# Patient Record
Sex: Female | Born: 1960 | Race: Black or African American | Hispanic: No | Smoking: Never smoker
Health system: Southern US, Community
[De-identification: ages and names within clinical notes are randomized; demographics above are authoritative.]

## PROBLEM LIST (undated history)

## (undated) DIAGNOSIS — M329 Systemic lupus erythematosus, unspecified: Secondary | ICD-10-CM

## (undated) DIAGNOSIS — G459 Transient cerebral ischemic attack, unspecified: Secondary | ICD-10-CM

## (undated) DIAGNOSIS — M549 Dorsalgia, unspecified: Secondary | ICD-10-CM

## (undated) DIAGNOSIS — R0789 Other chest pain: Secondary | ICD-10-CM

## (undated) DIAGNOSIS — K589 Irritable bowel syndrome without diarrhea: Secondary | ICD-10-CM

## (undated) DIAGNOSIS — R609 Edema, unspecified: Secondary | ICD-10-CM

## (undated) DIAGNOSIS — IMO0002 Reserved for concepts with insufficient information to code with codable children: Secondary | ICD-10-CM

## (undated) DIAGNOSIS — I639 Cerebral infarction, unspecified: Secondary | ICD-10-CM

## (undated) DIAGNOSIS — M797 Fibromyalgia: Secondary | ICD-10-CM

## (undated) DIAGNOSIS — Z8719 Personal history of other diseases of the digestive system: Secondary | ICD-10-CM

## (undated) DIAGNOSIS — I10 Essential (primary) hypertension: Secondary | ICD-10-CM

## (undated) DIAGNOSIS — I5030 Unspecified diastolic (congestive) heart failure: Secondary | ICD-10-CM

## (undated) DIAGNOSIS — R55 Syncope and collapse: Secondary | ICD-10-CM

## (undated) DIAGNOSIS — Z9889 Other specified postprocedural states: Secondary | ICD-10-CM

## (undated) DIAGNOSIS — R768 Other specified abnormal immunological findings in serum: Secondary | ICD-10-CM

## (undated) DIAGNOSIS — G8929 Other chronic pain: Secondary | ICD-10-CM

## (undated) DIAGNOSIS — I341 Nonrheumatic mitral (valve) prolapse: Secondary | ICD-10-CM

## (undated) DIAGNOSIS — M79609 Pain in unspecified limb: Secondary | ICD-10-CM

## (undated) DIAGNOSIS — F419 Anxiety disorder, unspecified: Secondary | ICD-10-CM

## (undated) HISTORY — PX: ABDOMINAL HYSTERECTOMY: SHX81

## (undated) HISTORY — DX: Irritable bowel syndrome, unspecified: K58.9

## (undated) HISTORY — PX: CARDIAC CATHETERIZATION: SHX172

## (undated) HISTORY — DX: Edema, unspecified: R60.9

## (undated) HISTORY — DX: Transient cerebral ischemic attack, unspecified: G45.9

## (undated) HISTORY — PX: SPINE SURGERY: SHX786

## (undated) HISTORY — DX: Dorsalgia, unspecified: M54.9

## (undated) HISTORY — DX: Other chronic pain: G89.29

## (undated) HISTORY — DX: Anxiety disorder, unspecified: F41.9

## (undated) HISTORY — DX: Syncope and collapse: R55

## (undated) HISTORY — PX: TONSILLECTOMY: SUR1361

## (undated) HISTORY — DX: Pain in unspecified limb: M79.609

## (undated) HISTORY — DX: Other specified abnormal immunological findings in serum: R76.8

---

## 1997-12-22 ENCOUNTER — Ambulatory Visit (HOSPITAL_COMMUNITY): Admission: RE | Admit: 1997-12-22 | Discharge: 1997-12-22 | Payer: Self-pay | Admitting: Obstetrics and Gynecology

## 1998-02-13 ENCOUNTER — Inpatient Hospital Stay (HOSPITAL_COMMUNITY): Admission: RE | Admit: 1998-02-13 | Discharge: 1998-02-15 | Payer: Self-pay | Admitting: Obstetrics and Gynecology

## 1999-04-06 ENCOUNTER — Emergency Department (HOSPITAL_COMMUNITY): Admission: EM | Admit: 1999-04-06 | Discharge: 1999-04-07 | Payer: Self-pay | Admitting: Emergency Medicine

## 1999-07-26 ENCOUNTER — Emergency Department (HOSPITAL_COMMUNITY): Admission: EM | Admit: 1999-07-26 | Discharge: 1999-07-26 | Payer: Self-pay | Admitting: Emergency Medicine

## 1999-07-26 ENCOUNTER — Encounter: Payer: Self-pay | Admitting: Emergency Medicine

## 1999-11-17 ENCOUNTER — Other Ambulatory Visit: Admission: RE | Admit: 1999-11-17 | Discharge: 1999-11-17 | Payer: Self-pay | Admitting: Obstetrics and Gynecology

## 1999-11-30 ENCOUNTER — Encounter: Payer: Self-pay | Admitting: Emergency Medicine

## 1999-11-30 ENCOUNTER — Emergency Department (HOSPITAL_COMMUNITY): Admission: EM | Admit: 1999-11-30 | Discharge: 1999-11-30 | Payer: Self-pay | Admitting: Emergency Medicine

## 2000-03-31 ENCOUNTER — Other Ambulatory Visit: Admission: RE | Admit: 2000-03-31 | Discharge: 2000-03-31 | Payer: Self-pay | Admitting: Obstetrics and Gynecology

## 2000-04-01 ENCOUNTER — Emergency Department (HOSPITAL_COMMUNITY): Admission: EM | Admit: 2000-04-01 | Discharge: 2000-04-01 | Payer: Self-pay | Admitting: Emergency Medicine

## 2000-04-01 ENCOUNTER — Encounter: Payer: Self-pay | Admitting: Emergency Medicine

## 2000-05-30 ENCOUNTER — Encounter: Payer: Self-pay | Admitting: Family Medicine

## 2000-05-30 ENCOUNTER — Ambulatory Visit (HOSPITAL_COMMUNITY): Admission: RE | Admit: 2000-05-30 | Discharge: 2000-05-30 | Payer: Self-pay | Admitting: Family Medicine

## 2000-06-08 ENCOUNTER — Ambulatory Visit (HOSPITAL_COMMUNITY): Admission: RE | Admit: 2000-06-08 | Discharge: 2000-06-08 | Payer: Self-pay | Admitting: Neurology

## 2000-06-08 ENCOUNTER — Encounter: Payer: Self-pay | Admitting: Neurology

## 2000-06-14 ENCOUNTER — Encounter: Payer: Self-pay | Admitting: Emergency Medicine

## 2000-06-14 ENCOUNTER — Inpatient Hospital Stay (HOSPITAL_COMMUNITY): Admission: EM | Admit: 2000-06-14 | Discharge: 2000-06-15 | Payer: Self-pay | Admitting: Emergency Medicine

## 2000-07-27 ENCOUNTER — Ambulatory Visit (HOSPITAL_COMMUNITY): Admission: RE | Admit: 2000-07-27 | Discharge: 2000-07-27 | Payer: Self-pay | Admitting: Gastroenterology

## 2000-07-29 ENCOUNTER — Encounter: Payer: Self-pay | Admitting: Family Medicine

## 2000-07-29 ENCOUNTER — Ambulatory Visit (HOSPITAL_COMMUNITY): Admission: RE | Admit: 2000-07-29 | Discharge: 2000-07-29 | Payer: Self-pay | Admitting: Family Medicine

## 2000-08-11 ENCOUNTER — Emergency Department (HOSPITAL_COMMUNITY): Admission: EM | Admit: 2000-08-11 | Discharge: 2000-08-11 | Payer: Self-pay | Admitting: Emergency Medicine

## 2000-08-11 ENCOUNTER — Encounter: Payer: Self-pay | Admitting: Emergency Medicine

## 2000-11-09 ENCOUNTER — Encounter: Payer: Self-pay | Admitting: Emergency Medicine

## 2000-11-09 ENCOUNTER — Emergency Department (HOSPITAL_COMMUNITY): Admission: EM | Admit: 2000-11-09 | Discharge: 2000-11-09 | Payer: Self-pay | Admitting: *Deleted

## 2000-11-20 ENCOUNTER — Emergency Department (HOSPITAL_COMMUNITY): Admission: EM | Admit: 2000-11-20 | Discharge: 2000-11-20 | Payer: Self-pay | Admitting: Emergency Medicine

## 2000-11-20 ENCOUNTER — Encounter: Payer: Self-pay | Admitting: Emergency Medicine

## 2000-11-22 ENCOUNTER — Encounter: Payer: Self-pay | Admitting: Emergency Medicine

## 2000-11-22 ENCOUNTER — Emergency Department (HOSPITAL_COMMUNITY): Admission: EM | Admit: 2000-11-22 | Discharge: 2000-11-22 | Payer: Self-pay | Admitting: Emergency Medicine

## 2000-12-23 ENCOUNTER — Ambulatory Visit (HOSPITAL_COMMUNITY): Admission: RE | Admit: 2000-12-23 | Discharge: 2000-12-23 | Payer: Self-pay | Admitting: Family Medicine

## 2000-12-23 ENCOUNTER — Encounter: Payer: Self-pay | Admitting: Family Medicine

## 2000-12-28 ENCOUNTER — Ambulatory Visit (HOSPITAL_BASED_OUTPATIENT_CLINIC_OR_DEPARTMENT_OTHER): Admission: RE | Admit: 2000-12-28 | Discharge: 2000-12-28 | Payer: Self-pay | Admitting: Family Medicine

## 2001-01-03 ENCOUNTER — Encounter: Admission: RE | Admit: 2001-01-03 | Discharge: 2001-01-03 | Payer: Self-pay | Admitting: Family Medicine

## 2001-01-03 ENCOUNTER — Encounter: Payer: Self-pay | Admitting: Family Medicine

## 2001-01-18 ENCOUNTER — Encounter: Payer: Self-pay | Admitting: Family Medicine

## 2001-01-18 ENCOUNTER — Encounter: Admission: RE | Admit: 2001-01-18 | Discharge: 2001-01-18 | Payer: Self-pay | Admitting: Family Medicine

## 2001-08-17 ENCOUNTER — Other Ambulatory Visit: Admission: RE | Admit: 2001-08-17 | Discharge: 2001-08-17 | Payer: Self-pay | Admitting: Gynecology

## 2001-10-03 ENCOUNTER — Inpatient Hospital Stay (HOSPITAL_COMMUNITY): Admission: AD | Admit: 2001-10-03 | Discharge: 2001-10-05 | Payer: Self-pay | Admitting: Psychiatry

## 2001-10-17 ENCOUNTER — Other Ambulatory Visit (HOSPITAL_COMMUNITY): Admission: RE | Admit: 2001-10-17 | Discharge: 2001-10-21 | Payer: Self-pay | Admitting: Psychiatry

## 2001-11-10 ENCOUNTER — Encounter: Admission: RE | Admit: 2001-11-10 | Discharge: 2001-11-10 | Payer: Self-pay | Admitting: *Deleted

## 2002-08-15 ENCOUNTER — Other Ambulatory Visit: Admission: RE | Admit: 2002-08-15 | Discharge: 2002-08-15 | Payer: Self-pay | Admitting: Obstetrics and Gynecology

## 2002-08-20 ENCOUNTER — Ambulatory Visit (HOSPITAL_BASED_OUTPATIENT_CLINIC_OR_DEPARTMENT_OTHER): Admission: RE | Admit: 2002-08-20 | Discharge: 2002-08-20 | Payer: Self-pay | Admitting: Neurology

## 2002-11-14 ENCOUNTER — Ambulatory Visit (HOSPITAL_BASED_OUTPATIENT_CLINIC_OR_DEPARTMENT_OTHER): Admission: RE | Admit: 2002-11-14 | Discharge: 2002-11-14 | Payer: Self-pay | Admitting: Neurology

## 2003-05-27 ENCOUNTER — Encounter: Payer: Self-pay | Admitting: Emergency Medicine

## 2003-05-27 ENCOUNTER — Emergency Department (HOSPITAL_COMMUNITY): Admission: EM | Admit: 2003-05-27 | Discharge: 2003-05-27 | Payer: Self-pay | Admitting: Emergency Medicine

## 2003-09-10 ENCOUNTER — Other Ambulatory Visit: Admission: RE | Admit: 2003-09-10 | Discharge: 2003-09-10 | Payer: Self-pay | Admitting: Obstetrics and Gynecology

## 2003-11-27 ENCOUNTER — Encounter: Admission: RE | Admit: 2003-11-27 | Discharge: 2003-11-27 | Payer: Self-pay | Admitting: Gastroenterology

## 2003-12-13 ENCOUNTER — Emergency Department (HOSPITAL_COMMUNITY): Admission: EM | Admit: 2003-12-13 | Discharge: 2003-12-13 | Payer: Self-pay | Admitting: Emergency Medicine

## 2003-12-21 ENCOUNTER — Ambulatory Visit (HOSPITAL_COMMUNITY): Admission: RE | Admit: 2003-12-21 | Discharge: 2003-12-21 | Payer: Self-pay | Admitting: Gastroenterology

## 2004-08-25 ENCOUNTER — Emergency Department (HOSPITAL_COMMUNITY): Admission: EM | Admit: 2004-08-25 | Discharge: 2004-08-25 | Payer: Self-pay | Admitting: Emergency Medicine

## 2004-09-15 ENCOUNTER — Ambulatory Visit (HOSPITAL_COMMUNITY): Admission: RE | Admit: 2004-09-15 | Discharge: 2004-09-15 | Payer: Self-pay | Admitting: Cardiology

## 2004-12-07 ENCOUNTER — Encounter: Admission: RE | Admit: 2004-12-07 | Discharge: 2004-12-07 | Payer: Self-pay | Admitting: Family Medicine

## 2005-02-11 ENCOUNTER — Ambulatory Visit (HOSPITAL_COMMUNITY): Admission: RE | Admit: 2005-02-11 | Discharge: 2005-02-11 | Payer: Self-pay | Admitting: Family Medicine

## 2005-02-11 ENCOUNTER — Ambulatory Visit: Payer: Self-pay | Admitting: Cardiology

## 2005-02-11 ENCOUNTER — Encounter: Payer: Self-pay | Admitting: Cardiology

## 2005-05-06 ENCOUNTER — Encounter: Admission: RE | Admit: 2005-05-06 | Discharge: 2005-05-06 | Payer: Self-pay | Admitting: Family Medicine

## 2006-06-15 ENCOUNTER — Emergency Department (HOSPITAL_COMMUNITY): Admission: EM | Admit: 2006-06-15 | Discharge: 2006-06-15 | Payer: Self-pay | Admitting: Emergency Medicine

## 2007-07-19 ENCOUNTER — Ambulatory Visit (HOSPITAL_COMMUNITY): Admission: RE | Admit: 2007-07-19 | Discharge: 2007-07-19 | Payer: Self-pay | Admitting: Family Medicine

## 2008-02-07 ENCOUNTER — Ambulatory Visit: Payer: Self-pay | Admitting: Vascular Surgery

## 2008-02-08 ENCOUNTER — Encounter: Admission: RE | Admit: 2008-02-08 | Discharge: 2008-03-13 | Payer: Self-pay | Admitting: Orthopaedic Surgery

## 2009-04-06 ENCOUNTER — Emergency Department (HOSPITAL_COMMUNITY): Admission: EM | Admit: 2009-04-06 | Discharge: 2009-04-06 | Payer: Self-pay | Admitting: Emergency Medicine

## 2009-12-12 ENCOUNTER — Encounter: Admission: RE | Admit: 2009-12-12 | Discharge: 2009-12-12 | Payer: Self-pay | Admitting: Family Medicine

## 2010-07-31 ENCOUNTER — Ambulatory Visit (HOSPITAL_COMMUNITY): Admission: RE | Admit: 2010-07-31 | Discharge: 2010-07-31 | Payer: Self-pay | Admitting: Internal Medicine

## 2010-10-09 ENCOUNTER — Emergency Department (HOSPITAL_COMMUNITY): Admission: EM | Admit: 2010-10-09 | Discharge: 2010-07-12 | Payer: Self-pay | Admitting: Emergency Medicine

## 2010-12-05 ENCOUNTER — Emergency Department (HOSPITAL_COMMUNITY): Payer: PRIVATE HEALTH INSURANCE

## 2010-12-05 ENCOUNTER — Emergency Department (HOSPITAL_COMMUNITY)
Admission: EM | Admit: 2010-12-05 | Discharge: 2010-12-05 | Disposition: A | Discharge status: Home / Self Care | Payer: PRIVATE HEALTH INSURANCE | Attending: Emergency Medicine | Admitting: Emergency Medicine

## 2010-12-05 DIAGNOSIS — M19019 Primary osteoarthritis, unspecified shoulder: Secondary | ICD-10-CM | POA: Insufficient documentation

## 2010-12-05 DIAGNOSIS — M542 Cervicalgia: Secondary | ICD-10-CM | POA: Insufficient documentation

## 2010-12-05 DIAGNOSIS — M25519 Pain in unspecified shoulder: Secondary | ICD-10-CM | POA: Insufficient documentation

## 2011-01-15 LAB — URINALYSIS, ROUTINE W REFLEX MICROSCOPIC
Bilirubin Urine: NEGATIVE
Glucose, UA: NEGATIVE mg/dL
Hgb urine dipstick: NEGATIVE
Ketones, ur: NEGATIVE mg/dL
Nitrite: NEGATIVE
Protein, ur: NEGATIVE mg/dL
Specific Gravity, Urine: 1.015 (ref 1.005–1.030)
Urobilinogen, UA: 1 mg/dL (ref 0.0–1.0)
pH: 7.5 (ref 5.0–8.0)

## 2011-01-15 LAB — DIFFERENTIAL
Basophils Absolute: 0 10*3/uL (ref 0.0–0.1)
Basophils Relative: 1 % (ref 0–1)
Eosinophils Absolute: 0.2 10*3/uL (ref 0.0–0.7)
Eosinophils Relative: 3 % (ref 0–5)
Lymphocytes Relative: 31 % (ref 12–46)
Lymphs Abs: 2 10*3/uL (ref 0.7–4.0)
Monocytes Absolute: 0.4 10*3/uL (ref 0.1–1.0)
Monocytes Relative: 7 % (ref 3–12)
Neutro Abs: 3.9 10*3/uL (ref 1.7–7.7)
Neutrophils Relative %: 58 % (ref 43–77)

## 2011-01-15 LAB — CBC
HCT: 34.9 % — ABNORMAL LOW (ref 36.0–46.0)
Hemoglobin: 12 g/dL (ref 12.0–15.0)
MCH: 31.9 pg (ref 26.0–34.0)
MCHC: 34.4 g/dL (ref 30.0–36.0)
MCV: 92.9 fL (ref 78.0–100.0)
Platelets: 181 10*3/uL (ref 150–400)
RBC: 3.76 MIL/uL — ABNORMAL LOW (ref 3.87–5.11)
RDW: 12.4 % (ref 11.5–15.5)
WBC: 6.5 10*3/uL (ref 4.0–10.5)

## 2011-01-15 LAB — COMPREHENSIVE METABOLIC PANEL
ALT: 12 U/L (ref 0–35)
AST: 22 U/L (ref 0–37)
Albumin: 3.8 g/dL (ref 3.5–5.2)
Alkaline Phosphatase: 47 U/L (ref 39–117)
BUN: 8 mg/dL (ref 6–23)
CO2: 28 mEq/L (ref 19–32)
Calcium: 8.8 mg/dL (ref 8.4–10.5)
Chloride: 107 mEq/L (ref 96–112)
Creatinine, Ser: 0.84 mg/dL (ref 0.4–1.2)
GFR calc Af Amer: >60 mL/min (ref >60)
GFR calc non Af Amer: >60 mL/min (ref >60)
Glucose, Bld: 96 mg/dL (ref 70–99)
Potassium: 3.6 mEq/L (ref 3.5–5.1)
Sodium: 139 mEq/L (ref 135–145)
Total Bilirubin: 0.3 mg/dL (ref 0.3–1.2)
Total Protein: 6.7 g/dL (ref 6.0–8.3)

## 2011-01-15 LAB — PREGNANCY, URINE: Preg Test, Ur: NEGATIVE

## 2011-01-15 LAB — LIPASE, BLOOD: Lipase: 26 U/L (ref 11–59)

## 2011-01-23 ENCOUNTER — Emergency Department (HOSPITAL_COMMUNITY)
Admission: EM | Admit: 2011-01-23 | Discharge: 2011-01-23 | Discharge status: Against Medical Advice | Payer: PRIVATE HEALTH INSURANCE | Attending: Emergency Medicine | Admitting: Emergency Medicine

## 2011-02-09 LAB — URINALYSIS, ROUTINE W REFLEX MICROSCOPIC
Bilirubin Urine: NEGATIVE
Glucose, UA: NEGATIVE mg/dL
Hgb urine dipstick: NEGATIVE
Ketones, ur: NEGATIVE mg/dL
Nitrite: NEGATIVE
Protein, ur: NEGATIVE mg/dL
Specific Gravity, Urine: 1.021 (ref 1.005–1.030)
Urobilinogen, UA: 0.2 mg/dL (ref 0.0–1.0)
pH: 6 (ref 5.0–8.0)

## 2011-02-09 LAB — POCT I-STAT, CHEM 8
BUN: 9 mg/dL (ref 6–23)
Calcium, Ion: 1.16 mmol/L (ref 1.12–1.32)
Chloride: 101 mEq/L (ref 96–112)
Creatinine, Ser: 1.1 mg/dL (ref 0.4–1.2)
Glucose, Bld: 84 mg/dL (ref 70–99)
HCT: 36 % (ref 36.0–46.0)
Hemoglobin: 12.2 g/dL (ref 12.0–15.0)
Potassium: 3.4 mEq/L — ABNORMAL LOW (ref 3.5–5.1)
Sodium: 139 mEq/L (ref 135–145)
TCO2: 29 mmol/L (ref 0–100)

## 2011-02-09 LAB — GLUCOSE, CAPILLARY: Glucose-Capillary: 85 mg/dL (ref 70–99)

## 2011-02-25 ENCOUNTER — Encounter (HOSPITAL_COMMUNITY): Payer: Self-pay | Admitting: Radiology

## 2011-02-25 ENCOUNTER — Emergency Department (HOSPITAL_COMMUNITY)
Admission: EM | Admit: 2011-02-25 | Discharge: 2011-02-25 | Disposition: A | Discharge status: Home / Self Care | Payer: PRIVATE HEALTH INSURANCE | Attending: Emergency Medicine | Admitting: Emergency Medicine

## 2011-02-25 ENCOUNTER — Emergency Department (HOSPITAL_COMMUNITY): Payer: PRIVATE HEALTH INSURANCE

## 2011-02-25 DIAGNOSIS — R55 Syncope and collapse: Secondary | ICD-10-CM | POA: Insufficient documentation

## 2011-02-25 DIAGNOSIS — H9209 Otalgia, unspecified ear: Secondary | ICD-10-CM | POA: Insufficient documentation

## 2011-02-25 DIAGNOSIS — J45909 Unspecified asthma, uncomplicated: Secondary | ICD-10-CM | POA: Insufficient documentation

## 2011-02-25 DIAGNOSIS — I10 Essential (primary) hypertension: Secondary | ICD-10-CM | POA: Insufficient documentation

## 2011-02-25 DIAGNOSIS — R51 Headache: Secondary | ICD-10-CM | POA: Insufficient documentation

## 2011-02-25 HISTORY — DX: Essential (primary) hypertension: I10

## 2011-02-25 LAB — BASIC METABOLIC PANEL
CO2: 29 mEq/L (ref 19–32)
Calcium: 8.9 mg/dL (ref 8.4–10.5)
GFR calc Af Amer: >60 mL/min (ref >60)
GFR calc non Af Amer: >60 mL/min (ref >60)
Potassium: 4.2 mEq/L (ref 3.5–5.1)
Sodium: 138 mEq/L (ref 135–145)

## 2011-02-25 LAB — CBC
HCT: 34.8 % — ABNORMAL LOW (ref 36.0–46.0)
MCV: 89.9 fL (ref 78.0–100.0)
RBC: 3.87 MIL/uL (ref 3.87–5.11)
RDW: 12.2 % (ref 11.5–15.5)
WBC: 7.2 10*3/uL (ref 4.0–10.5)

## 2011-02-25 LAB — DIFFERENTIAL
Basophils Absolute: 0 10*3/uL (ref 0.0–0.1)
Eosinophils Relative: 3 % (ref 0–5)
Lymphocytes Relative: 27 % (ref 12–46)
Lymphs Abs: 1.9 10*3/uL (ref 0.7–4.0)
Neutro Abs: 4.6 10*3/uL (ref 1.7–7.7)
Neutrophils Relative %: 64 % (ref 43–77)

## 2011-02-25 LAB — CK TOTAL AND CKMB (NOT AT ARMC)
CK, MB: 1.8 ng/mL (ref 0.3–4.0)
CK, MB: 1.2 ng/mL (ref 0.3–4.0)
Relative Index: 1.4 (ref 0.0–2.5)

## 2011-02-25 LAB — TROPONIN I: Troponin I: 0.03 ng/mL (ref 0.00–0.06)

## 2011-03-17 NOTE — Procedures (Signed)
LOWER EXTREMITY VENOUS REFLUX EXAM   INDICATION:  Bilateral leg varicose veins with pain and swelling.   EXAM:  Using color-flow imaging and pulse Doppler spectral analysis, the  right and left common femoral, superficial femoral, popliteal, posterior  tibial, greater and lesser saphenous veins are evaluated.  There is no  evidence suggesting deep venous insufficiency in the right and lower  extremity.   The left saphenofemoral junction is not competent.  The right and left  GSV is not competent with the caliber as described below.   The right and left proximal short saphenous vein demonstrates  competency.   GSV Diameter (used if found to be incompetent only)                                            Right    Left  Proximal Greater Saphenous Vein           0.43 cm  0.42 cm  Proximal-to-mid-thigh                     0.43 cm  0.42 cm  Mid thigh                                 0.43 cm  0.33 cm  Mid-distal thigh                          0.43 cm  0.33 cm  Distal thigh                              0.28 cm  0.46 cm  Knee                                      0.26 cm  0.37 cm   IMPRESSION:  1. Right and left greater saphenous vein reflux is identified with the      caliber ranging from 0.26 cm to 0.8 cm knee to groin on the right      and on the left 0.37 cm to 1.03 cm.  2. The right and left saphenofemoral junction  is not aneurysmal.  3. The right and left greater saphenous vein is not tortuous.  4. The deep venous system is competent.  5. The right and left lesser saphenous vein is competent.  6. Chronic deep venous thrombosis noted at a right proximal      superficial vein.   ___________________________________________  Quita Skye. Hart Rochester, M.D.   MG/MEDQ  D:  02/07/2008  T:  02/07/2008  Job:  [id number]

## 2011-03-17 NOTE — Consult Note (Signed)
VASCULAR SURGERY CONSULTATION   [name], [name]  DOB:  08-06-1961                                       02/07/2008  CHART#:[id number]   The patient is a 50 year old female referred for evaluation of venous  insufficiency in both lower extremities.  She states that she has been  having throbbing discomfort in both legs, left slightly worse than the  right, particularly in the posterior thigh and behind the left knee with  both legs being very restless, particularly at night.  She has itching  discomfort as well.  She had some similar symptoms along the right  medial thigh area.  These progress as the day progresses.  She has some  swelling toward the end of the day in both ankles, right equal to left.  She has no history of deep vein thrombosis, thrombophlebitis, or  pulmonary emboli.  She has not worn elastic compression stockings.  She  does occasionally elevate the legs with no improvement.  She also takes  ibuprofen 800 mg per day with no improvement.   PAST MEDICAL HISTORY:  1. Hypertension.  2. Possible hyperlipidemia.  3. Asthma.  4. Negative for diabetes, coronary artery disease, COPD, or stroke.   PAST SURGICAL HISTORY:  Tonsillectomy.  She has had a negative cardiac  catheterization in the past.   FAMILY HISTORY:  Positive for diabetes and myocardial infarction in her  mother and father, negative for stroke.   SOCIAL HISTORY:  She is a separated female, has 3 children, and is a  Physicist, medical.  She does not use tobacco and drinks occasional  alcohol.   REVIEW OF SYSTEMS AND MEDICATIONS:  Please see health history form.   ALLERGIES:  None known.   PHYSICAL EXAM:  Blood pressure 100/70, heart rate is 70, respirations  14.  Generally, she is a middle-aged female in no apparent distress.  Alert and oriented x3.  Her neck is supple with 3+ carotid pulses  palpable.  No bruits are audible.  Neurologic exam is normal.  No  palpable adenopathy in the  neck.  Chest is clear to auscultation.  Cardiovascular exam reveals regular rhythm with no murmur.  Abdomen is  soft and nontender with no palpable masses.  Upper extremity pulses are  3+ bilaterally.  Lower extremities reveal 3+ femoral, popliteal, and  dorsalis pedis pulses palpable.  There is minimal distal edema.  She has  no large varicosities, but does have some small varicosities in the  reticular veins, as well as spider veins, particularly in the left  lateral thigh posteriorly, and in the popliteal area, as well as in the  right medial thigh.  There is no hyperpigmentation or ulceration  distally.   Venous duplex exam of both legs reveals mild reflux in the greater  saphenous veins, which are quite small.  On the right side, there  appears to be some changes consistent with some chronic DVT in the  proximal superficial femoral vein, near the saphenofemoral junction with  no complete obstruction.  No incompetent perforators are noted.   I do not think that she needs laser ablation of her small saphenous  veins, although there is some reflux in both systems, but not at the  junctions.  Because of the changes consistent with previous DVT in the  right leg, although she has no history of it, I  do not think laser  ablation would be wise.  Any treatment would be primary sclerotherapy  aimed at the small varicosities, reticular veins, and spider veins, and  she will consider this and be in touch with [place] if she decides she wants  to proceed.   Quita Skye Hart Rochester, M.D.  Electronically Signed  JDL/MEDQ  D:  02/07/2008  T:  02/08/2008  Job:  [id number]   cc:   Betti D. Pecola Leisure, M.D.

## 2011-03-20 NOTE — H&P (Signed)
[facility]  Patient:    [name], [name] Visit Number: [id number] MRN: [id number]          Service Type: PSY Location: 500 [id number] Attending Physician:  Jeanice Lim Dictated by:   Candi Leash. Orsini, N.P. Admit Date:  10/03/2001 Discharge Date: 10/05/2001                     Psychiatric Admission Assessment  DATE OF ADMISSION: October 03, 2001  IDENTIFYING INFORMATION:  This is a 50 year old, married, African American female, voluntarily admitted on October 03, 2001, for depression and suicidal ideation.  HISTORY OF PRESENT ILLNESS: The patient presents with a history of depression, feeling very angry, and having passive suicidal ideation after her husband told her that he had another child with another woman. She reports that she is very angry at herself for being faithful and trusting. She is having passive suicidal ideation to just sleep and not wake up, although she states she has no current suicidal ideation, she needs to be there for her children. Denies any current suicide or homicidal ideation or auditory visual hallucinations. No paranoia. Reports that she had cut her arms a few days ago and is concerned that may hold her from being discharged. The patient has been sleeping poorly. Her appetite has been decreased but has no significant weight loss. The patient feels very overwhelmed.  PAST PSYCHIATRIC HISTORY: First visit to [facility]. No other hospitalizations. The patient recently had cut her arms a few days ago.  SOCIAL HISTORY: She is a 50 year old, married, African American female, first marriage of 12 years, 3 children ages, 21, 45, and 69. She lives with her husband and children. She is a Lawyer. She completed high school, 2 years of college. No legal problems.  FAMILY HISTORY: She has a mother who has some problems, unsure of what they are.  Brother who is in alcohol and substance  abuse.  ALCOHOL/DRUG HISTORY: Non smoker, rare alcohol intake. Denies any substance abuse.  PRIMARY CARE PHYSICIAN: Dr. Reather Converse at [facility].  PAST MEDICAL HISTORY: Medical problems: No specific diagnosis, but the patient reports problems of feeling very dizzy and confused, problems with her vision and recent headaches and has had a recent angiogram for chest pain that she was experiencing. Apparently everything has been within normal limits.  MEDICATIONS: None. The patient was on Zoloft at one time, approximately one year ago for 4 days, was having problems with the medication and stopped it.  DRUG ALLERGIES: PROTONIX, she gets blisters.  REVIEW OF SYSTEMS: The patient denies any fevers or chills. Reports no appetite but no changes in weight. Reports problems with her vision, describes her vision as telescopic and she reports her doctor said she has problems with glaucoma, is on no eye drops at this time. No hearing loss or sinus pain. History of chest pain and chest pressure. She had a recent angiogram that was negative. Non smoker. No cough or shortness of breath. No history of heartburn or change in habits. No constipation or diarrhea. No dysuria, frequency or hematuria. No stiffness, swelling or joint pain. No itching wounds or rash. No weakness or tremors. History of headaches. Psychiatric: History of depression with suicidal thoughts and panic attacks. Endocrine: Reported goiter, not on medications. No history of anemia. No environmental allergies.  PHYSICAL EXAMINATION:  VITAL SIGNS: The patient is 184 pounds, approximately 5 feet 5 inches. Last vital signs 97.3, 66 heart rate, respirations are 20, blood  pressure is 133/63.  GENERAL APPEARANCE: The patient is a 50 year old, African American female in no acute distress. She is well-developed, somewhat overweight, appears her stated age. She is fairly groomed, alert and cooperative with fair eye contact. Hair  is long, dark, clean. She can raise her eyebrows.  HEENT: Her EOMs are intact bilaterally. External ear canals are patent. Her hearing is appropriate to conversation. No sinus tenderness. No nasal discharge. Mucosa is moist. Fair dentition. No lesions were seen. Her tongue protrudes midline without tremors. She can clinch her teeth and pop put her cheeks. Neck is supple. No JVD. No lymphadenopathy. There is some neck enlargement. Thyroid is nonpalpable, nontender. Trachea is midline.  CHEST: Clear to auscultation. No adventitious sound nor cough.  HEART: Heart rate is regular rate and rhythm without murmurs, gallops, or rubs. Carotid pulses are equal and adequate. No edema was noted.  BREASTS: Examination is deferred.  ABDOMEN: Soft, nontender. No CVA tenderness.  MUSCULOSKELETAL: Good range of motion. Muscle strength and tone are equal bilaterally. No signs of injury.  SKIN: Warm and dry. Nail beds are pink with good capillary refill. Nine of the 10 fingers had artificial nails, one fingernail was without. Superficial abrasion to her left forearm, she states that is from past cutting.  NEUROLOGICAL: Oriented x3. Cranial nerves are grossly intact. Good grip strength bilaterally. No involuntary movements. Gait is normal. Cerebellar function is intact with heel-to-shin and normal alternating movements. Romberg is negative.  MENTAL STATUS EXAMINATION: She is an alert, middle-aged, slightly overweight casually dressed, cooperative, fair eye contact, African American female. Speech is normal and relevant. Mood is depressed. Affect is sad.  Thought processes are coherent. No evidence of psychosis. No auditory or visual hallucinations. No suicidal or homicidal ideation. No paranoia.  Cognitive function is intact. Oriented x4. Judgment is fair. Insight is fair.  ADMISSION DIAGNOSES: Axis I:    Major depression, recurrent. Axis II:   Deferred. Axis III:  The patient complains of  "dizziness." Axis IV:   Problems with primary support group. Axis V:    Current is 30, estimated this past year is 10.   PLAN: Voluntary admission to [facility] for depression and suicidal ideation. Contract for safety, check every 15 minutes. Will obtain labs.  Will initiate an antidepressant, trazodone for sleep. Our goal is to stabilize her mood and decrease the patient ______.  [facility]  TENTATIVE LENGTH OF STAY: Three to four days. Dictated by:   Candi Leash. Orsini, N.P. Attending Physician:  Jeanice Lim DD:  10/04/01 TD:  10/05/01 Job: [id number]

## 2011-03-20 NOTE — Procedures (Signed)
[facility]  Patient:    [name], [name]                        MRN: [id number] Proc. Date: 07/27/00 Adm. Date:  [id number] Attending:  Deneen Harts CC:         Lilyan Punt. Sydnee Levans, M.D.  Runell Gess, M.D.   Procedure Report  PROCEDURE:  Panendoscopy.  INDICATIONS FOR PROCEDURE:  A 50 year old African-American female with noncardiac chest pain. Prior history of peptic ulcer disease. Tolerated Protonix with skin rash. Tolerated Nexium poorly because of fatigue and dizziness.  DESCRIPTION OF PROCEDURE:  After reviewing the nature of the procedure with the patient including potential risks and complications and after discussing alternative methods of diagnosis and treatment, informed consent was signed.  The patient was premedicated receiving topical anesthetic followed by IV sedation totaling Versed 7 mg, fentanyl 50 mcg administered IV in divided doses prior to and during the course of the procedure.  Using the Olympus video endoscope, the proximal esophagus was intubated under direct vision. Normal oropharynx. No lesion of the epiglottis, vocal chords or piriform sinus. The proximal, mid and distal segments of the esophagus are normal except for increased erythema in the distal segment. No erosion or ulceration noted. The mucosal Z line was distinct at 41 cm. There was no stricture. No hiatal hernia evident.  The gastric fundus and body were normal. The antrum was remarkable for patchy erythema consistent with mild antritis. No erosion or ulceration. Pylorus symmetric. Duodenal bulb and second portion normal. Retroflexed view of the angularis, lesser curve, gastric cardia and fundus negative. Biopsies obtained from the angularis and prepyloric antrum for Helicobacter. The stomach was then decompressed and the scope withdrawn.  The duodenal bulb and second portion normal.  The patient tolerated the procedure without difficulty being  maintained on Datascope monitor and low flow oxygen throughout.  ASSESSMENT: 1. Gastroesophageal reflux disease--mild distal esophagitis. 2. Antritis-mild. Helicobacter biopsy pending.  RECOMMENDATIONS: 1. Follow-up Helicobacter biopsy. 2. Treat if Helicobacter positive. 3. Trial of Prevacid 30 mg p.o. qam. 4. Return office visit 1 month or sooner p.r.n. DD:  07/27/00 TD:  07/27/00 Job: [id number]

## 2011-03-20 NOTE — Discharge Summary (Signed)
[facility]  Patient:    [name], [name]                        MRN: [id number] Adm. Date:  [id number] Disc. Date: [id number] Attending:  Berry, Jonathan [place] Dictator:   Mancel Bale, P.A. CC:         Lilyan Punt. Sydnee Levans, M.D.             Hedwig Morton. Juanda Chance, M.D. [facility]                           Discharge Summary  ADMITTING DIAGNOSES: 1. Chest pain, questionable unstable angina. 2. Hypertension. 3. Asthma. 4. History of mitral valve prolapse but with no mitral valve prolapse on her    last echocardiogram. 5. History of panic attack.  DISCHARGE DIAGNOSES: 1. Chest pain, questionable unstable angina. 2. Hypertension. 3. Asthma. 4. History of mitral valve prolapse but with no mitral valve prolapse on her    last echocardiogram. 5. History of panic attack. 6. Status post cardiac catheterization on June 14, 2000, revealing normal    coronary arteries, normal left ventricular function, performed by Dr.    Nanetta Batty.  HISTORY OF PRESENT ILLNESS:  [name] is a 50 year old married black female with a three month history of chest pain.  Eventually three months ago, she was awakened in the middle of the night with chest pain "like bricks on her chest."  She had mild nausea.  She called EMS and was seen in the ER and was discharged home.  She has had cardiology workup with [facility] cardiology.  She had a stress Cardiolite with EF of 75% with fixed defect in the anterior wall associated with clinical complaints of chest pain on stress test.  The chest pain during the stress test began at two minutes and resolved after six minutes into test.  She had 2-D echocardiogram which showed EF 65% with no valve morphology and no evidence of MVP.  She presented to the ER on June 14, 2000, with complaints of chest pain. She stated that that day, she awoke with chest pain that was severe.  She then called EMS and was given sublingual nitroglycerin and aspirin  with some improvement of symptoms.  At the time of interview, she was currently having some chest discomfort.  Over the last three months, the pain was symptoms increased with movement and sometimes not.  She had complaints of light-headedness and feeling like she was in a dream.  She had been tried on Paxil and Zoloft as an outpatient which increased her feeling of being in a dream, so she stopped them.  She also took hydrochlorothiazide for hypertension but then decreased blood pressure, so was instructed to stop that.  At that time, she was currently having mild chest pressure and had complaints of racing heart rate earlier that morning.  At that time, on exam, she was hemodynamically stable with blood pressure 133/87, pulse 61, oxygen saturations 97% on room air.  She was planned for admission to telemetry to rule out MI with serial enzymes.  We planned for IV heparin, nitroglycerin, Pepcid, and p.r.n. Ativan and Celexa.  We planned for cardiac catheterization for definitive diagnosis.  HOSPITAL COURSE:  On June 14, 2000, [name] underwent cardiac catheterization by Dr. Nanetta Batty.  She was found to have normal coronary arteries, normal LV function, normal abdominal aorta.  It was  felt that she had noncardiac chest pain, and we planned for empiric therapy with proton pump inhibitor and NSAIDs.  We planned for discharge home the following day.  On June 15, 2000, [name] is stable without any complaints.  She is afebrile at 97.8, has a pulse of 65, blood pressure 115/64, oxygen saturations 97% on room air.  Her groin is stable.  She is felt to be stable for discharge home at this time.  HOSPITAL CONSULTANTS:  None.  HOSPITAL PROCEDURES  Cardiac catheterization on June 14, 2000, by Dr. Nanetta Batty, revealing normal coronary arteries and normal LV function.  HOSPITAL LABORATORIES:  Cardiac enzymes are negative x 3.  CBC is normal with WBC 4.7, hemoglobin 12.3,  hematocrit 35.4, platelets 181.  Metabolic profile is also normal with sodium 142, potassium 3.9, BUN 9, creatinine 1.0.  EKG revealed normal sinus rhythm 62 beats per minute with nonspecific ST-T changes.  DISCHARGE MEDICATIONS: 1. Prevacid 15 mg 1 p.o. q.d. 2. Vioxx 25 mg 1 p.o. q.d. 3. Norvasc 5 mg 1 p.o. q.d.  ACTIVITY:  No strenuous activity or lifting greater than 5 pounds, traveling or sexual activity for three days.  May gently wash her groin with warm water and soap.  Call the office at [phone] if any bleeding or increased size or pain in the groin.  FOLLOW-UP:  Follow-up with Dr. Allyson Sabal in [place] on September 5, at 9:30 a.m. DD:  06/15/00 TD:  06/15/00 Job: [id number]

## 2011-03-20 NOTE — Discharge Summary (Signed)
[facility]  Patient:    [name], [name] Visit Number: [id number] MRN: [id number]          Service Type: PSY Location: [facility] Attending Physician:  Benjaman Pott Dictated by:   Jeanice Lim, M.D. Admit Date:  10/17/2001 Discharge Date: 10/21/2001                             Discharge Summary  IDENTIFYING DATA:  This is a 50 year old married African-American female voluntarily admitted for depression and suicidal ideation.  The patient had cut her wrist a few days earlier.  ALLERGIES:  PROTONIX (which causes blisters).  MEDICATIONS:  None.  The patient had been on Zoloft in the past.  PHYSICAL EXAMINATION:  Essentially within normal limits and unremarkable. Neurologically nonfocal.  LABORATORY DATA:  Routine admission labs were essentially within normal limits including an EKG with slight bradycardia.  Urinalysis negative.  Urine tox screen negative and CBC with differential and comprehensive metabolic panel as well as thyroid profile within normal limits.  MENTAL STATUS EXAMINATION:  The patient was an alert, middle-aged, slightly overweight, casually dressed, cooperative, African-American female appearing stated age, maintaining fair eye contact with psychomotor abnormalities. Speech within normal limits.  Mood depressed.  Affect sad.  Thought processes goal directed.  Thought content negative for psychotic symptoms.  No suicidal or homicidal ideation.  Cognitively intact.  Judgment and insight fair.  ADMISSION DIAGNOSES: Axis I:    Major depression, recurrent, severe. Axis II:   None. Axis III:  History of complaint of dizziness. Axis IV:   Moderate (problems with primary support). Axis V:    30/68.  HOSPITAL COURSE:  The patient was ordered routine p.r.n. medications and given Ativan for acute anxiety.  Lexapro was started targeting depressive symptoms and trazodone p.r.n. insomnia to restore sleep.  The patient  tolerated medications well.  CONDITION ON DISCHARGE:  Improved.  Mood was more euthymic.  Affect brighter. Thought process goal directed.  Thought content negative for psychotic symptoms or dangerous ideation.  The patients judgment and insight improved and she reported motivation to be compliant with follow-up plans, demonstrating some hope.  DISCHARGE MEDICATIONS: 1. Lexapro 10 mg, 1/2 q.a.m. 2. Trazodone 50 mg q.h.s.  FOLLOW-UP:  A family and childrens services referral was made as well as [facility] referral and patient was to follow up with the intensive outpatient program at the [facility] on October 06, 2001 at 9 a.m.  DISCHARGE DIAGNOSES: Axis I:    Major depression, recurrent, severe. Axis II:   None. Axis III:  History of complaint of dizziness. Axis IV:   Moderate (problems with primary support). Axis V:    Global Assessment of Functioning on discharge 55. Dictated by:   Jeanice Lim, M.D. Attending Physician:  Carolanne Grumbling D DD:  11/13/01 TD:  11/13/01 Job: [id number]

## 2011-03-20 NOTE — Cardiovascular Report (Signed)
[place]. [facility]  Patient:    [name], [name]                        MRN: [id number] Adm. Date:  [id number] Attending:  Berry, Jonathan [place] CC:         [facility] Cardiac Cath Lab  Lilyan Punt. Sydnee Levans, M.D.  [facility], [address], [place], [place]  [address]   Cardiac Catheterization  INDICATION:  Ms. Balazs is a 50 year old married African-American female, mother of three children, who is currently in school studying physical education.  She has been evaluated in the past by Dr. Meade Maw and had an unremarkable 2-D echo and an exercise Cardiolite stress test which showed breast attenuation.  She was awakened at 2 oclock this morning with substernal chest pain, diaphoresis and shortness of breath and came to [facility] where her EKG was unremarkable and her enzymes were negative. Her pain had some atypical qualities including a pleuritic and positional component.  She presents now for diagnostic coronary arteriography.  DESCRIPTION OF PROCEDURE:  Patient was brought to the second floor [place] [facility] in the post-absorptive state.  She was premedicated with p.o. Valium and IV Versed and Nubain.  Her right groin was prepped and shaved in the usual sterile fashion.  Xylocaine 1% was used for local anesthesia.  A 6-French sheath was inserted into the right femoral artery using standard Seldinger technique.  The 6-French right and left diagnostic catheters along with a 6-French pigtail catheter were used for selective coronary angiography, left ventriculography and distal abdominal aortography.  Omnipaque dye was used for the entirety of the case.  Retrograde aortic, left ventricular and pullback pressures were recorded.  HEMODYNAMICS 1. Aortic systolic pressure 137; diastolic pressure 93. 2. Left ventricular systolic pressure 137, end-diastolic pressure 16.  SELECTIVE CORONARY ANGIOGRAPHY 1. Left  main normal. 2. LAD normal. 3. Left circumflex normal. 4. Right coronary artery was dominant and normal.  LEFT VENTRICULOGRAPHY:  RAO left ventriculogram was performed using 20 cc of Omnipaque dye at 10 cc/sec.  The overall LEVF was estimated greater than 60% without focal wall motion abnormalities.  DISTAL ABDOMINAL AORTOGRAPHY:  Distal abdominal aortogram was performed using 20 cc of Omnipaque dye at 20 cc/sec.  The renal arteries were widely patent. The infrarenal abdominal aorta and iliac bifurcation appeared free of significant atherosclerotic changes.  IMPRESSION:  Ms. Arambula has a normal catheterization with normal coronary arteries, normal left ventricular function, normal renal arteries.  I believe her chest pain was noncardiac and was likely gastrointestinal and/or musculoskeletal.  She will be treated empirically with a proton pump inhibitor such as Prevacid and a nonsteroidal COX-2 anti-inflammatory such as Vioxx.  An ACT is measured at 189.  The right femoral arterial puncture site was suture-closed using the Perclose S device.  Pressure was held on the groin to achieve hemostasis.  The patient left the lab in stable condition.  She will be discharged home in the morning and will follow up in the office in approximately two weeks.  She will need an outpatient gastrointestinal evaluation.  She left the lab in stable condition.  She was notably very anxious during the procedure and exhibited an unusual low pain threshold. DD:  06/14/00 TD:  06/15/00 Job: [id number]

## 2011-03-20 NOTE — H&P (Signed)
[place]. [facility]  Patient:    [name], [name]                        MRN: [id number] Adm. Date:  [id number] Attending:  Berry, Jonathan [place] Dictator:   Darcella Gasman. Annie Paras, F.N.P.C. CC:         Lilyan Punt. Sydnee Levans, M.D.   History and Physical  CHIEF COMPLAINT:  Chest pain.  HISTORY OF PRESENT ILLNESS:  A 50 year old black female with three months history of chest pain, initially, three months ago, waking in the middle of the night with chest pain like bricks on her chest.  Had mild nausea, called EMS, and seen in the ER.  She was discharged home after normal labs, normal CT of her chest.  She then had a cardiology workup with [facility].  Stress Cardiolite with EF 75% with fixed defects in the anterior wall, associated with clinical complaints of chest pain at stress test, felt to be ______ . Her chest pain began at two minutes into exercise, resolved six minutes into recovery.  She has a 2-D echo, EF was 65%, normal valve morphology, no evidence of mitral valve prolapse.  Today, June 14, 2000, she awakened with chest pain, severe, called EMS, given sublingual nitroglycerin and aspirin with some improvement of her symptoms.  Currently with chest discomfort.  Over the last three months, the pain has increased some with movement, just turning in bed, sometimes not. She complains of lightheadedness, feels like she is in a daydream.  Her primary care has given her Paxil and Zoloft at different times.  She states it makes her feel like she is more in a daydream, so she stopped both medications.  She was on HCTZ for hypertension, but she had a drop in her blood pressure so she was instructed to stop HCTZ.  In the past, she was reported to have had mitral valve prolapse.  This echo does not show any.  Currently, here in the emergency room, she continues with mild chest pressure. She did state she had racing heart rate this morning with her  discomfort. She was given Ativan 0.5 IV with relief of the chest pain, and the nitroglycerin drip has been started.  We will go ahead and give IV heparin an d plan cardiac catheterization today.  If the catheterization is normal, will get a GI workup.  PAST MEDICAL HISTORY: 1. Hypertension. 2. Cardiac as described. 3. History of asthma. 4. History of mitral valve prolapse. 5. Panic attacks.  PAST SURGICAL HISTORY:  Tonsillectomy, hysterectomy.  OUTPATIENT MEDICATIONS:  None.  ALLERGIES:  PROTONIX caused blisters.  She states she feels sleepy after eating SEAFOOD.  FAMILY HISTORY:  Father died at 10 with MI.  Mother is alive with MIs in her late 37s.  Sister alive and well, brother alive and well.  Two aunts with heart disease.  SOCIAL HISTORY:  Married.  Her husband works nights.  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  No tobacco, no alcohol use.  REVIEW OF SYSTEMS:  GI:  History of constipation, has seen Dr. Juanda Chance in the past.  GU:  Negative.  ENDOCRINE:  No diabetes, no thyroid disease.  CARDIAC: As stated.  GYN:  She has had a hysterectomy after three children.  CT of her chest was negative in the past.  PHYSICAL EXAMINATION:  VITAL SIGNS:  Blood pressure 133/87, pulse 61, respirations 22, temperature 96.8, room air  oxygen saturation 97%.  GENERAL:  Alert, oriented, tearful, and anxious black female.  SKIN:  Warm and dry, brisk capillary refill.  HEENT:  Unremarkable.  NECK:  Supple, no JVD, no bruits, no adenopathy, no thyromegaly.  HEART:  S1, S2, regular rate and rhythm without murmur, gallop, rub, or click.  LUNGS:  Clear.  ABDOMEN:  Soft, nontender, positive bowel sounds.  EXTREMITIES:  No edema, 2+ pedal, 2+ radial pulses bilaterally.  NEUROLOGIC:  Nonfocal.  IMPRESSION: 1. Chest pain, rule out cardiac. 2. Hypertension. 3. Anxiety, which has increased, with history of panic attacks.  PLAN:  IV  heparin, IV nitroglycerin, serial CK-MBs.  Will add Pepcid to her medical regimen, as well as p.r.n. Ativan and begin Celexa.  If cardiac catheterization, which we will do today as an add-on, is normal, we will get a GI consult and probably start nonsteroidals for chest wall pain.  Her anxiety level is up.  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  Dr. Allyson Sabal has seen her and assessed her with me.  Will follow her labs, which are pending currently.  EKG was nonspecific lateral changes in her T waves. DD:  06/14/00 TD:  06/14/00 Job: [id number]

## 2011-03-20 NOTE — Op Note (Signed)
NAME:  [name], [name]                           ACCOUNT NO.:  [id number]   MEDICAL RECORD NO.:  [id number]                   PATIENT TYPE:  AMB   LOCATION:  ENDO                                 FACILITY:  [facility]   PHYSICIAN:  Anselmo Rod, M.D.               DATE OF BIRTH:  12-30-1960   DATE OF PROCEDURE:  12/21/2003  DATE OF DISCHARGE:                                 OPERATIVE REPORT   PROCEDURE:  Screening colonoscopy.   ENDOSCOPIST:  Charna Elizabeth, M.D.   INSTRUMENT USED:  Olympus video colonoscope.   INDICATIONS FOR PROCEDURE:  [age]-year-old African American female with  a family history of colon cancer and a personal history of right lower  quadrant pain and rectal bleeding undergoing screening colonoscopy to rule  out colonic polyps, masses, etc.   PROCEDURE PERFORMED:  Informed consent was procured from the patient.  The  patient fasted for eight hours prior to the procedure and prepped with a  bottle of magnesium citrate and a gallon of GOLYTELY the night prior to the  procedure.   PREPROCEDURE PHYSICAL EXAMINATION:  VITAL SIGNS:  The patient had stable  vital signs.  NECK:  Supple.  CHEST:  Clear to auscultation.  HEART:  S1 and S2 regular.  ABDOMEN:  Soft with normal bowel sounds.   DESCRIPTION OF PROCEDURE:  The patient was placed in the left lateral  decubitus position, sedated with 100 mcg of fentanyl and 8 mg of Versed  intravenously.  Once the patient was adequately sedated and maintained on  low flow oxygen and continuous cardiac monitoring, the Olympus video  colonoscope was advanced from the rectum to the cecum.  The appendiceal  orifice and ileocecal valve were clearly visualized photographed.  No  masses, polyps, erosions, ulcerations or diverticula were seen.  Small  internal hemorrhoids were appreciated on retroflexion in the rectum.   IMPRESSION:  Normal colonoscopy up to the cecum except for small internal  hemorrhoids.   RECOMMENDATIONS:  1.  Repeat CRC screening is recommended in the next five years unless the     patient develops any abnormal     symptoms.  2. High fiber diet with liberal fluid intake.  3. Outpatient followup in the next two weeks for further recommendations.                                               Anselmo Rod, M.D.    JNM/MEDQ  D:  12/21/2003  T:  12/22/2003  Job:  [id number]   cc:   Maxie Better, M.D.  [address]  [place]  [place] [address]  Fax: [phone]

## 2011-07-06 ENCOUNTER — Emergency Department (HOSPITAL_COMMUNITY)
Admission: EM | Admit: 2011-07-06 | Discharge: 2011-07-07 | Disposition: A | Discharge status: Home / Self Care | Payer: PRIVATE HEALTH INSURANCE | Attending: Emergency Medicine | Admitting: Emergency Medicine

## 2011-07-06 DIAGNOSIS — I10 Essential (primary) hypertension: Secondary | ICD-10-CM | POA: Insufficient documentation

## 2011-07-06 DIAGNOSIS — E7800 Pure hypercholesterolemia, unspecified: Secondary | ICD-10-CM | POA: Insufficient documentation

## 2011-07-06 DIAGNOSIS — J45909 Unspecified asthma, uncomplicated: Secondary | ICD-10-CM | POA: Insufficient documentation

## 2011-07-06 DIAGNOSIS — M7989 Other specified soft tissue disorders: Secondary | ICD-10-CM | POA: Insufficient documentation

## 2011-07-06 DIAGNOSIS — I059 Rheumatic mitral valve disease, unspecified: Secondary | ICD-10-CM | POA: Insufficient documentation

## 2011-07-06 LAB — COMPREHENSIVE METABOLIC PANEL
Albumin: 3.5 g/dL (ref 3.5–5.2)
Alkaline Phosphatase: 67 U/L (ref 39–117)
BUN: 10 mg/dL (ref 6–23)
CO2: 25 mEq/L (ref 19–32)
Chloride: 101 mEq/L (ref 96–112)
Glucose, Bld: 89 mg/dL (ref 70–99)
Potassium: 3.7 mEq/L (ref 3.5–5.1)
Total Bilirubin: 0.2 mg/dL — ABNORMAL LOW (ref 0.3–1.2)

## 2011-07-06 LAB — DIFFERENTIAL
Lymphocytes Relative: 28 % (ref 12–46)
Lymphs Abs: 2.2 10*3/uL (ref 0.7–4.0)
Neutrophils Relative %: 60 % (ref 43–77)

## 2011-07-06 LAB — URINALYSIS, ROUTINE W REFLEX MICROSCOPIC
Glucose, UA: NEGATIVE mg/dL
Hgb urine dipstick: NEGATIVE
Leukocytes, UA: NEGATIVE
pH: 7 (ref 5.0–8.0)

## 2011-07-06 LAB — CBC
HCT: 34.4 % — ABNORMAL LOW (ref 36.0–46.0)
Hemoglobin: 11.7 g/dL — ABNORMAL LOW (ref 12.0–15.0)
MCV: 90.5 fL (ref 78.0–100.0)
Platelets: 192 10*3/uL (ref 150–400)
RBC: 3.8 MIL/uL — ABNORMAL LOW (ref 3.87–5.11)
WBC: 7.7 10*3/uL (ref 4.0–10.5)

## 2011-07-07 ENCOUNTER — Ambulatory Visit (HOSPITAL_COMMUNITY)
Admission: RE | Admit: 2011-07-07 | Discharge: 2011-07-07 | Disposition: A | Discharge status: Home / Self Care | Payer: PRIVATE HEALTH INSURANCE | Source: Ambulatory Visit | Attending: Emergency Medicine | Admitting: Emergency Medicine

## 2011-07-07 DIAGNOSIS — M7989 Other specified soft tissue disorders: Secondary | ICD-10-CM | POA: Insufficient documentation

## 2011-07-07 DIAGNOSIS — M79609 Pain in unspecified limb: Secondary | ICD-10-CM | POA: Insufficient documentation

## 2011-07-13 ENCOUNTER — Encounter: Payer: Self-pay | Admitting: Vascular Surgery

## 2011-07-20 ENCOUNTER — Encounter: Payer: Self-pay | Admitting: Vascular Surgery

## 2011-07-21 ENCOUNTER — Ambulatory Visit (INDEPENDENT_AMBULATORY_CARE_PROVIDER_SITE_OTHER): Payer: PRIVATE HEALTH INSURANCE | Admitting: Vascular Surgery

## 2011-07-21 DIAGNOSIS — M7989 Other specified soft tissue disorders: Secondary | ICD-10-CM

## 2011-07-21 DIAGNOSIS — M79609 Pain in unspecified limb: Secondary | ICD-10-CM

## 2011-07-21 NOTE — Consult Note (Addendum)
NEW PATIENT CONSULTATION  MARCELLA, CHARLSON DOB:  [birth year]                                       07/21/2011 CHART#:[id number]  The patient is a 50 year old female who I saw 3 years ago for swelling in both lower extremities and some discomfort.  She was found at that time to have some mild reflux in her great saphenous systems but small saphenous veins.  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  She now presents with a 3 week history of swelling in the left foot and ankle.  She has had 2 ultrasound studies. One was performed at [facility] on 06/29/2011 which was negative for DVT.  She also had a second study at [facility] on 09/04.  We do not have that report today because of computer issues.  Apparently that also has no evidence of DVT.  She states the swelling in the foot started a few weeks ago and has gradually improved.  She has no history of DVT in the left leg.  She does not wear elastic compression stockings.  She has no history of stasis ulcers, bleeding or skin changes.  CHRONIC MEDICAL PROBLEMS: 1. Hypertension. 2. Hyperlipidemia. 3. Asthma. 4. Negative for diabetes, coronary artery disease, COPD or stroke.  FAMILY HISTORY:  Positive for diabetes and myocardial infarction in her mother and father.  Negative for stroke.  SOCIAL HISTORY:  She is single.  She has 3 children.  She is an Print production planner.  Does not use tobacco or alcohol.  REVIEW OF SYSTEMS:  Positive for weight gain, dizziness, leg discomfort with walking and lying flat, occasional slurred speech, asthma, wheezing, joint pain, muscle pain, dyspnea on exertion, palpitations, heart murmur, constipation.  All other systems are negative in complete review of systems.  PHYSICAL EXAM:  Vital signs:  Blood pressure 126/80, heart rate 89, respirations 18.  General:  She is a well-developed, well-nourished female in no apparent distress, alert  and oriented x3.  HEENT:  Exam normal for age.  Lungs:  Clear to auscultation.  No rhonchi or wheezing. Cardiovascular:  Regular rhythm.  No murmurs.  Carotid pulses 3+.  No audible bruits.  Abdomen:  Soft, nontender with no masses. Musculoskeletal:  Free of major deformities.  Neurological:  Normal. Skin:  Free of rashes.  Lower extremities:  Exam reveals 3+ femoral, popliteal and dorsalis pedis pulses palpable bilaterally.  There is 1 cm of edema in the left ankle compared to the right but no discrepancy in the calf or thigh circumference.  There is no hyperpigmentation, ulceration or significant varicosities, spider veins or reticular veins on exam.  Today I performed a bedside SonoSite ultrasound exam which revealed the great saphenous vein on the left to not be increased in size over the study 3 years ago with some mild reflux.  Swelling left ankle and foot, etiology unknown.  RECOMMENDATIONS: 1. Elevate the foot of the bed or the mattress during the night while     sleeping. 2. Short leg elastic compression stockings (20-30 mm gradient) to wear     during the day.  I have no other suggestions with no evidence of     deep venous thrombosis.    Quita Skye Hart [place], M.D. Electronically Signed  JDL/MEDQ  D:  07/21/2011  T:  07/21/2011  Job:  [id number]  cc:   [facility] Hayden Rasmussen, NP

## 2011-07-28 NOTE — Progress Notes (Signed)
Subjective:     Patient ID: [name], female   DOB: 08/31/1961, 50 y.o.   MRN: [id number]  HPI Notes scanned into EPIC   Review of Systems     Objective:   Physical Exam     Assessment:         Plan:

## 2011-08-04 ENCOUNTER — Other Ambulatory Visit: Payer: Self-pay | Admitting: Neurosurgery

## 2011-08-04 DIAGNOSIS — M545 Low back pain: Secondary | ICD-10-CM

## 2011-08-08 ENCOUNTER — Ambulatory Visit
Admission: RE | Admit: 2011-08-08 | Discharge: 2011-08-08 | Disposition: A | Discharge status: Home / Self Care | Payer: PRIVATE HEALTH INSURANCE | Source: Ambulatory Visit | Attending: Neurosurgery | Admitting: Neurosurgery

## 2011-08-08 DIAGNOSIS — M545 Low back pain: Secondary | ICD-10-CM

## 2011-08-18 ENCOUNTER — Encounter (HOSPITAL_COMMUNITY)
Admission: RE | Admit: 2011-08-18 | Discharge: 2011-08-18 | Disposition: A | Discharge status: Home / Self Care | Payer: PRIVATE HEALTH INSURANCE | Source: Ambulatory Visit | Attending: Neurosurgery | Admitting: Neurosurgery

## 2011-08-18 LAB — CBC
MCV: 91.5 fL (ref 78.0–100.0)
Platelets: 155 10*3/uL (ref 150–400)
RDW: 12.8 % (ref 11.5–15.5)
WBC: 7.3 10*3/uL (ref 4.0–10.5)

## 2011-08-18 LAB — SURGICAL PCR SCREEN: MRSA, PCR: NEGATIVE

## 2011-08-18 LAB — BASIC METABOLIC PANEL
Calcium: 9.8 mg/dL (ref 8.4–10.5)
Chloride: 101 mEq/L (ref 96–112)
Creatinine, Ser: 0.84 mg/dL (ref 0.50–1.10)
GFR calc Af Amer: >90 mL/min (ref >90)

## 2011-08-19 ENCOUNTER — Encounter: Payer: Self-pay | Admitting: Cardiology

## 2011-08-19 ENCOUNTER — Ambulatory Visit (INDEPENDENT_AMBULATORY_CARE_PROVIDER_SITE_OTHER): Payer: PRIVATE HEALTH INSURANCE | Admitting: Cardiology

## 2011-08-19 ENCOUNTER — Telehealth: Payer: Self-pay | Admitting: Cardiology

## 2011-08-19 VITALS — BP 123/80 | HR 66 | Ht 66.0 in | Wt 198.8 lb

## 2011-08-19 DIAGNOSIS — I10 Essential (primary) hypertension: Secondary | ICD-10-CM

## 2011-08-19 DIAGNOSIS — Z01810 Encounter for preprocedural cardiovascular examination: Secondary | ICD-10-CM

## 2011-08-19 DIAGNOSIS — R0789 Other chest pain: Secondary | ICD-10-CM

## 2011-08-19 DIAGNOSIS — R079 Chest pain, unspecified: Secondary | ICD-10-CM

## 2011-08-19 NOTE — Telephone Encounter (Signed)
Darl Pikes, Dr. Sueanne Margarita office called wanted to know if Ms. Locatelli is cleared for surgery. Advised sent clearance to them about 12:45. If can not find will resend.

## 2011-08-19 NOTE — Assessment & Plan Note (Signed)
Controlled on medication

## 2011-08-19 NOTE — Progress Notes (Signed)
[name] Date of Birth: 09/29/1961 Medical Record #[id number]  History of Present Illness: [name] is seen today at the request of Dr. Mikal Plane for preoperative risk assessment for back surgery. She is a 50 year old black female who has a history of hypertension. She has no known history of cardiac disease. She reports being diagnosed with mitral valve prolapse 27 years ago and was placed on Inderal for a period of time. Subsequent echocardiograms and 2001 and 2006 showed no evidence of prolapse. She had a normal stress Cardiolite in the past. She had a normal cardiac catheterization in 2001. She has noticed chest pain off and on that is fairly fleeting. It is not related to exertion. It is infrequent. It is difficult to describe. She is scheduled for lumbar disc surgery tomorrow. She reports her blood pressure has been under good control. She denies a history of diabetes or hypercholesterolemia.  Current Outpatient Prescriptions on File Prior to Visit  Medication Sig Dispense Refill  . enalapril-hydrochlorothiazide (VASERETIC) 10-25 MG per tablet Take 1 tablet by mouth daily.        . Furosemide (LASIX PO) Take by mouth as needed.        . neomycin-colistin-hydrocortisone-thonzonium (CORTISPORIN-TC) 3.01-02-09-0.5 MG/ML otic suspension 3 drops 4 (four) times daily.          Allergies  Allergen Reactions  . Prednisone   . Protonix     Past Medical History  Diagnosis Date  . Hypertension   . Edema   . Pain in limb   . Back pain   . Asthma     Past Surgical History  Procedure Date  . Abdominal hysterectomy   . Cardiac catheterization   . Tonsillectomy     History  Smoking status  . Never Smoker   Smokeless tobacco  . Not on file    History  Alcohol Use: Not on file    Family History  Problem Relation Age of Onset  . Diabetes Mother   . Hypertension Mother   . Diabetes Father   . Hypertension Father   . Stroke Other   . Coronary artery disease Other      Review of Systems: The review of systems is positive for back pain. She has a history of chronic swelling in her left leg. She has had extensive evaluation in the past by the vascular surgeons. All other systems were reviewed and are negative.  Physical Exam: BP 123/80  Pulse 66  Ht 5\' 6"  (1.676 m)  Wt 198 lb 12.8 oz (90.175 kg)  BMI 32.09 kg/m2 She is a pleasant white female in no acute distress.The patient is alert and oriented x 3.  The mood and affect are normal.  The skin is warm and dry.  Color is normal.  The HEENT exam reveals that the sclera are nonicteric.  The mucous membranes are moist.  The carotids are 2+ without bruits.  There is no thyromegaly.  There is no JVD.  The lungs are clear.  The chest wall is non tender.  The heart exam reveals a regular rate with a normal S1 and S2.  There are no murmurs, gallops, or rubs.  The PMI is not displaced.   Abdominal exam reveals good bowel sounds.  There is no guarding or rebound.  There is no hepatosplenomegaly or tenderness.  There are no masses.  Exam of the legs reveal no clubbing, cyanosis, or edema.  The legs are without rashes.  The distal pulses are intact.  Cranial nerves II - XII are intact.  Motor and sensory functions are intact.  The gait is normal.  LABORATORY DATA: ECG today is normal.  Assessment / Plan:

## 2011-08-19 NOTE — Patient Instructions (Signed)
We will clear you for your back surgery.  Continue your current medications

## 2011-08-19 NOTE — Assessment & Plan Note (Signed)
I think that this patient is at low risk for cardiovascular complications of surgery. She has mild atypical chest pain symptoms. She has had extensive negative cardiac evaluation in the past. Her ECG today is normal and her exam is normal. We will go ahead and clear her for surgery.

## 2011-08-19 NOTE — Telephone Encounter (Signed)
Pt being seen for surgical clearance today.  Her surgery is tomorrow   Please call her ASAP and advise if she can still have surgery.

## 2011-08-20 ENCOUNTER — Ambulatory Visit (HOSPITAL_COMMUNITY)
Admission: RE | Admit: 2011-08-20 | Discharge: 2011-08-21 | Disposition: A | Discharge status: Home / Self Care | Payer: PRIVATE HEALTH INSURANCE | Source: Ambulatory Visit | Attending: Neurosurgery | Admitting: Neurosurgery

## 2011-08-20 ENCOUNTER — Encounter: Payer: PRIVATE HEALTH INSURANCE | Admitting: Vascular Surgery

## 2011-08-20 ENCOUNTER — Other Ambulatory Visit: Payer: PRIVATE HEALTH INSURANCE

## 2011-08-20 ENCOUNTER — Ambulatory Visit (HOSPITAL_COMMUNITY): Payer: PRIVATE HEALTH INSURANCE

## 2011-08-20 DIAGNOSIS — Z01812 Encounter for preprocedural laboratory examination: Secondary | ICD-10-CM | POA: Insufficient documentation

## 2011-08-20 DIAGNOSIS — Z8711 Personal history of peptic ulcer disease: Secondary | ICD-10-CM | POA: Insufficient documentation

## 2011-08-20 DIAGNOSIS — K589 Irritable bowel syndrome without diarrhea: Secondary | ICD-10-CM | POA: Insufficient documentation

## 2011-08-20 DIAGNOSIS — J45909 Unspecified asthma, uncomplicated: Secondary | ICD-10-CM | POA: Insufficient documentation

## 2011-08-20 DIAGNOSIS — M5126 Other intervertebral disc displacement, lumbar region: Secondary | ICD-10-CM | POA: Insufficient documentation

## 2011-08-20 DIAGNOSIS — I10 Essential (primary) hypertension: Secondary | ICD-10-CM | POA: Insufficient documentation

## 2011-08-25 ENCOUNTER — Other Ambulatory Visit: Payer: PRIVATE HEALTH INSURANCE

## 2011-08-25 ENCOUNTER — Encounter: Payer: PRIVATE HEALTH INSURANCE | Admitting: Vascular Surgery

## 2011-08-26 NOTE — Op Note (Signed)
  [name], HARMON NO.:  [id number]  MEDICAL RECORD NO.:  [id number]  LOCATION:  3528                         FACILITY:  [facility]  PHYSICIAN:  Coletta Memos, M.D.     DATE OF BIRTH:  April 05, 1961  DATE OF PROCEDURE:  08/20/2011 DATE OF DISCHARGE:                              OPERATIVE REPORT   PREOPERATIVE DIAGNOSES: 1. Displaced disk, right L5-S1. 2. Right S1 radiculopathy.  POSTOPERATIVE DIAGNOSES: 1. Displaced disk, right L5-S1. 2. Right S1 radiculopathy.  PROCEDURES:  Right L5-S1 semi hemilaminectomy and diskectomy with microdissection.  COMPLICATIONS:  None.  SURGEON:  Coletta Memos, MD.  ASSISTANT:  Stefani Dama, MD.  INDICATIONS:  [name] presented with right lower extremity pain. She had what appeared to be a herniated disk on the right side at L5-S1. I therefore offered and she agreed to undergo operative decompression.  OPERATIVE NOTE:  [name] was brought to the operating room, intubated, and placed under general anesthetic without difficulty.  She was rolled prone onto a Wilson frame and all pressure points were properly padded. Her back was prepped.  She was draped in a sterile fashion.  I opened the skin with a #10 blade and took this down to the thoracolumbar fascia.  Intraoperative x-ray showed I was in the correct interlaminar space between L5 and S1 and I was on the right side.  I removed the ligamentum flavum after performing semi hemilaminectomy with both Kerrison punches and a high-speed drill.  I exposed the thecal sac.  I brought the microscope into the operative field and with Dr. Verlee Rossetti assistance, we proceeded with the decompression.  I decompressed the S1 nerve root by retracting it medially, identifying the disk space, and opening it with a #15 blade.  Dr. Danielle Dess and I both worked on the disk space and underlying disk material.  There was a good deal of endplate and the soft tissue, which had lodged itself  underneath the right S1 nerve root and essentially performed a nice capsule around itself.  I had to work with dissectors and Epstein curettes to free up that material.  But I was able to fully decompress the right S1 nerve root.  There certainly was more disk material medial to me in the midline, but I was able to decompress the thecal sac also.  I irrigated the wound.  I then used fentanyl and Depo-Medrol in my resection site.  I then closed the wound in layered fashion using Vicryl sutures to reapproximate the thoracolumbar subcutaneous subcuticular layers.  I placed Dermabond for sterile dressing.  The patient tolerated the procedure well.          ______________________________ Coletta Memos, M.D.     KC/MEDQ  D:  08/20/2011  T:  08/21/2011  Job:  [id number]  Electronically Signed by Coletta Memos M.D. on 08/26/2011 08:06:19 PM

## 2011-08-26 NOTE — H&P (Signed)
NAMEAUBRY, RANKIN NO.:  [id number]  MEDICAL RECORD NO.:  [id number]  LOCATION:  3528                         FACILITY:  [facility]  PHYSICIAN:  Coletta Memos, M.D.     DATE OF BIRTH:  Mar 22, 1961  DATE OF ADMISSION:  08/20/2011 DATE OF DISCHARGE:                             HISTORY & PHYSICAL   PREOPERATIVE DIAGNOSIS:  Lumbar displaced disk, right L5-S1.  INDICATIONS:  [name] is a 50 year old woman who presented to me originally on October 2011.  At that time, she had had pain in the lower back, which she had had she said for 15 years.  She returned in September 2012, and at this time she was reporting a great deal of pain in the lower extremities.  She said it felt as if her spine was trying to collapse at times.  She has had edema in the left lower extremity and left foot.  She has had a great amount of difficulty bending.  Left lower extremity pains she does have a pressure said it feels like a fish hook in her right calf which is where she has most of her pain.  PAST MEDICAL HISTORY: 1. Hypertension. 2. Irritable bowel syndrome. 3. Peptic ulcer disease, asthma. 4. She has had a tonsillectomy and hysterectomy.  FAMILY HISTORY:  Mother is in poor health, has diabetes, rheumatoid arthritis, heart disease, lung disease.  Father deceased, had heart disease and myocardial infarction.  ALLERGIES:  She has reaction to PROTONIX which causes hives and PREDNISONE, which causes  headache, confusion and shortness of breath.  SOCIAL HISTORY:  She does not smoke.  She does not use alcohol.  She does not use illicit drugs.  She is 5 feet 7 inches tall.  She weighs 196 pounds.  She has a pulse of 60 and respiratory rate of 20.  REVIEW OF SYSTEMS:  Positive for night sweats, balance problems, sinus headache, chest pain, hypertension, heart murmur, hypercholesterolemia, swelling in the feet, leg pain with walking, asthma, shortness of breath, nausea, peptic ulcer  disease, abdominal pain, change in her bowel habits, arm weakness, leg weakness, leg pain, joint pain, and neck pain.  CURRENT MEDICATIONS:  Enalapril and hydrochlorothiazide.  MRI shows a large herniated disk at L5-S1 on the right side.  She denies bowel or bladder dysfunction.  PHYSICAL EXAMINATION:  GENERAL:  She is alert, oriented x4 and answering all questions appropriately. NEUROLOGIC:  Memory length, attention span, and fund of knowledge are normal.  Well kempt and in no distress.  5/5 strength in upper and lower extremities.  2+ reflexes biceps, triceps, brachioradialis, knees, and ankles.  Downgoing toes to plantar stimulation.  Negative straight leg raising bilaterally.  She had no reflexes elicited at the ankles.  She is able to toe walk and heel walk.  Romberg test negative.  Pupils equal, round, reactive to light.  Full extraocular movements.  Full visual fields.  Hearing intact to voice bilaterally and to finger rub. Uvula elevates midline.  Shoulder shrug is normal.  Tongue protrudes in the midline.  She has no cervical masses or bruits. LUNGS:  Lung fields are clear. HEART:  Regular rhythm and rate.  No  murmurs or rubs are appreciated. Pulses good at the wrist bilaterally.  ASSESSMENT AND PLAN:  [name] is being admitted today to undergo a lumbar laminectomy and diskectomy.  This was on the right side.  Risks and benefits of bleeding, infection, no relief, need for further surgery were discussed.  She understands and wishes to proceed.          ______________________________ Coletta Memos, M.D.     KC/MEDQ  D:  08/20/2011  T:  08/21/2011  Job:  [id number]  Electronically Signed by Coletta Memos M.D. on 08/26/2011 08:05:54 PM

## 2011-08-28 ENCOUNTER — Encounter: Payer: Self-pay | Admitting: Physician Assistant

## 2011-09-10 ENCOUNTER — Other Ambulatory Visit: Payer: Self-pay | Admitting: Neurosurgery

## 2011-09-10 DIAGNOSIS — M5126 Other intervertebral disc displacement, lumbar region: Secondary | ICD-10-CM

## 2011-09-17 ENCOUNTER — Ambulatory Visit
Admission: RE | Admit: 2011-09-17 | Discharge: 2011-09-17 | Disposition: A | Discharge status: Home / Self Care | Payer: PRIVATE HEALTH INSURANCE | Source: Ambulatory Visit | Attending: Neurosurgery | Admitting: Neurosurgery

## 2011-09-17 DIAGNOSIS — M5126 Other intervertebral disc displacement, lumbar region: Secondary | ICD-10-CM

## 2011-09-17 MED ORDER — GADOBENATE DIMEGLUMINE 529 MG/ML IV SOLN
17.0000 mL | Freq: Once | INTRAVENOUS | Status: AC | PRN
  Administered 2011-09-17: 17 mL via INTRAVENOUS

## 2011-12-22 ENCOUNTER — Observation Stay (HOSPITAL_COMMUNITY)
Admission: EM | Admit: 2011-12-22 | Discharge: 2011-12-25 | Disposition: A | Discharge status: Home / Self Care | Payer: PRIVATE HEALTH INSURANCE | Attending: Internal Medicine | Admitting: Internal Medicine

## 2011-12-22 ENCOUNTER — Other Ambulatory Visit: Payer: Self-pay

## 2011-12-22 ENCOUNTER — Encounter (HOSPITAL_COMMUNITY): Payer: Self-pay | Admitting: *Deleted

## 2011-12-22 ENCOUNTER — Emergency Department (HOSPITAL_COMMUNITY): Payer: PRIVATE HEALTH INSURANCE

## 2011-12-22 DIAGNOSIS — E785 Hyperlipidemia, unspecified: Secondary | ICD-10-CM

## 2011-12-22 DIAGNOSIS — I059 Rheumatic mitral valve disease, unspecified: Secondary | ICD-10-CM | POA: Insufficient documentation

## 2011-12-22 DIAGNOSIS — M549 Dorsalgia, unspecified: Secondary | ICD-10-CM | POA: Insufficient documentation

## 2011-12-22 DIAGNOSIS — Z8719 Personal history of other diseases of the digestive system: Secondary | ICD-10-CM

## 2011-12-22 DIAGNOSIS — Z9889 Other specified postprocedural states: Secondary | ICD-10-CM | POA: Insufficient documentation

## 2011-12-22 DIAGNOSIS — Z79899 Other long term (current) drug therapy: Secondary | ICD-10-CM | POA: Insufficient documentation

## 2011-12-22 DIAGNOSIS — R059 Cough, unspecified: Secondary | ICD-10-CM | POA: Insufficient documentation

## 2011-12-22 DIAGNOSIS — R05 Cough: Secondary | ICD-10-CM | POA: Insufficient documentation

## 2011-12-22 DIAGNOSIS — Z01810 Encounter for preprocedural cardiovascular examination: Secondary | ICD-10-CM

## 2011-12-22 DIAGNOSIS — I10 Essential (primary) hypertension: Secondary | ICD-10-CM

## 2011-12-22 DIAGNOSIS — R0602 Shortness of breath: Secondary | ICD-10-CM | POA: Insufficient documentation

## 2011-12-22 DIAGNOSIS — M47812 Spondylosis without myelopathy or radiculopathy, cervical region: Secondary | ICD-10-CM | POA: Insufficient documentation

## 2011-12-22 DIAGNOSIS — R0789 Other chest pain: Principal | ICD-10-CM

## 2011-12-22 DIAGNOSIS — I341 Nonrheumatic mitral (valve) prolapse: Secondary | ICD-10-CM

## 2011-12-22 HISTORY — DX: Nonrheumatic mitral (valve) prolapse: I34.1

## 2011-12-22 HISTORY — DX: Personal history of other diseases of the digestive system: Z87.19

## 2011-12-22 HISTORY — DX: Other specified postprocedural states: Z98.890

## 2011-12-22 LAB — DIFFERENTIAL
Eosinophils Absolute: 0.3 10*3/uL (ref 0.0–0.7)
Lymphocytes Relative: 29 % (ref 12–46)
Lymphs Abs: 2.1 10*3/uL (ref 0.7–4.0)
Monocytes Relative: 7 % (ref 3–12)
Neutrophils Relative %: 60 % (ref 43–77)

## 2011-12-22 LAB — CBC
Hemoglobin: 12.6 g/dL (ref 12.0–15.0)
MCH: 30.7 pg (ref 26.0–34.0)
RBC: 4.11 MIL/uL (ref 3.87–5.11)

## 2011-12-22 LAB — D-DIMER, QUANTITATIVE: D-Dimer, Quant: 0.33 ug/mL-FEU (ref 0.00–0.48)

## 2011-12-22 LAB — CARDIAC PANEL(CRET KIN+CKTOT+MB+TROPI)
Relative Index: 1.1 (ref 0.0–2.5)
Relative Index: 1.1 (ref 0.0–2.5)
Total CK: 184 U/L — ABNORMAL HIGH (ref 7–177)
Troponin I: <0.3 ng/mL (ref <0.30)
Troponin I: <0.3 ng/mL (ref <0.30)

## 2011-12-22 LAB — BASIC METABOLIC PANEL
BUN: 17 mg/dL (ref 6–23)
CO2: 27 mEq/L (ref 19–32)
Chloride: 99 mEq/L (ref 96–112)
GFR calc non Af Amer: >90 mL/min (ref >90)
Glucose, Bld: 97 mg/dL (ref 70–99)
Potassium: 3.6 mEq/L (ref 3.5–5.1)

## 2011-12-22 LAB — TSH: TSH: 0.938 u[IU]/mL (ref 0.350–4.500)

## 2011-12-22 MED ORDER — SODIUM CHLORIDE 0.9 % IJ SOLN: 3.0000 mL | INTRAMUSCULAR | Status: DC | PRN

## 2011-12-22 MED ORDER — ENOXAPARIN SODIUM 40 MG/0.4ML ~~LOC~~ SOLN
40.0000 mg | SUBCUTANEOUS | Status: DC
  Administered 2011-12-22 – 2011-12-24 (×3): 40 mg via SUBCUTANEOUS
  Filled 2011-12-22 (×5): qty 0.4

## 2011-12-22 MED ORDER — LISINOPRIL 20 MG PO TABS
20.0000 mg | ORAL_TABLET | Freq: Every day | ORAL | Status: DC
  Administered 2011-12-22 – 2011-12-25 (×4): 20 mg via ORAL
  Filled 2011-12-22 (×5): qty 1

## 2011-12-22 MED ORDER — SODIUM CHLORIDE 0.9 % IV SOLN: 250.0000 mL | INTRAVENOUS | Status: DC | PRN

## 2011-12-22 MED ORDER — ASPIRIN 81 MG PO CHEW
324.0000 mg | CHEWABLE_TABLET | Freq: Once | ORAL | Status: AC
  Administered 2011-12-22: 324 mg via ORAL
  Filled 2011-12-22: qty 1
  Filled 2011-12-22: qty 3

## 2011-12-22 MED ORDER — HYDRALAZINE HCL 20 MG/ML IJ SOLN
10.0000 mg | Freq: Once | INTRAMUSCULAR | Status: AC
  Administered 2011-12-22: 10 mg via INTRAVENOUS
  Filled 2011-12-22: qty 0.5

## 2011-12-22 MED ORDER — FENTANYL CITRATE 0.05 MG/ML IJ SOLN
12.5000 ug | Freq: Once | INTRAMUSCULAR | Status: AC
  Administered 2011-12-22: 12.5 ug via INTRAVENOUS
  Filled 2011-12-22: qty 2

## 2011-12-22 MED ORDER — SODIUM CHLORIDE 0.9 % IJ SOLN
3.0000 mL | Freq: Two times a day (BID) | INTRAMUSCULAR | Status: DC
  Administered 2011-12-22 – 2011-12-25 (×6): 3 mL via INTRAVENOUS

## 2011-12-22 MED ORDER — MORPHINE SULFATE 2 MG/ML IJ SOLN
0.5000 mg | INTRAMUSCULAR | Status: DC | PRN
  Administered 2011-12-22: 1 mg via INTRAVENOUS
  Filled 2011-12-22: qty 1

## 2011-12-22 MED ORDER — IBUPROFEN 800 MG PO TABS
800.0000 mg | ORAL_TABLET | Freq: Three times a day (TID) | ORAL | Status: DC
  Administered 2011-12-22 – 2011-12-25 (×11): 800 mg via ORAL
  Filled 2011-12-22 (×15): qty 1

## 2011-12-22 MED ORDER — NITROGLYCERIN 0.4 MG SL SUBL
0.4000 mg | SUBLINGUAL_TABLET | SUBLINGUAL | Status: DC | PRN
  Administered 2011-12-22 (×2): 0.4 mg via SUBLINGUAL
  Filled 2011-12-22: qty 25

## 2011-12-22 MED ORDER — ASPIRIN EC 325 MG PO TBEC
325.0000 mg | DELAYED_RELEASE_TABLET | Freq: Every day | ORAL | Status: DC
  Administered 2011-12-22 – 2011-12-25 (×4): 325 mg via ORAL
  Filled 2011-12-22 (×5): qty 1

## 2011-12-22 MED ORDER — FAMOTIDINE IN NACL 20-0.9 MG/50ML-% IV SOLN
20.0000 mg | Freq: Two times a day (BID) | INTRAVENOUS | Status: DC
  Administered 2011-12-22 – 2011-12-23 (×2): 20 mg via INTRAVENOUS
  Filled 2011-12-22 (×4): qty 50

## 2011-12-22 MED ORDER — MORPHINE SULFATE 2 MG/ML IJ SOLN: 2.0000 mg | INTRAMUSCULAR | Status: DC | PRN

## 2011-12-22 MED ORDER — SODIUM CHLORIDE 0.9 % IJ SOLN
3.0000 mL | Freq: Two times a day (BID) | INTRAMUSCULAR | Status: DC
  Administered 2011-12-22 – 2011-12-25 (×5): 3 mL via INTRAVENOUS

## 2011-12-22 MED ORDER — MORPHINE SULFATE 2 MG/ML IJ SOLN: 1.0000 mg | INTRAMUSCULAR | Status: DC | PRN

## 2011-12-22 MED ORDER — SIMVASTATIN 20 MG PO TABS
20.0000 mg | ORAL_TABLET | Freq: Every day | ORAL | Status: DC
  Administered 2011-12-22 – 2011-12-24 (×3): 20 mg via ORAL
  Filled 2011-12-22 (×4): qty 1

## 2011-12-22 MED ORDER — ONDANSETRON HCL 4 MG/2ML IJ SOLN
4.0000 mg | Freq: Once | INTRAMUSCULAR | Status: AC
  Administered 2011-12-22: 4 mg via INTRAVENOUS
  Filled 2011-12-22: qty 2

## 2011-12-22 MED ORDER — KETOROLAC TROMETHAMINE 30 MG/ML IJ SOLN
30.0000 mg | Freq: Four times a day (QID) | INTRAMUSCULAR | Status: DC | PRN
  Administered 2011-12-22 – 2011-12-23 (×3): 30 mg via INTRAVENOUS
  Filled 2011-12-22 (×3): qty 1

## 2011-12-22 NOTE — ED Notes (Signed)
Attempted to call report but accepting RN unable to get report at this time.  She has asked to be called back in about 10 minutes.

## 2011-12-22 NOTE — Consult Note (Addendum)
PCP: None   Chief Complaint: Chest pain   HPI: [name] is an 51 y.o. female with history of hyperlipidemia, back pain status post back surgery, hypertension, asthma, was strong family history of premature coronary disease, presents to [place] long with substernal chest pain. Her pain seems to be with movement Pain worse with deep breathing. She has no fever, chills, cough, abdominal pain, lightheadedness or palpitation. It should be noted that 3 days ago, she has an upper endoscopy and told to have esophageal stricture and dilatation was performed. She had severe nausea and vomiting for 2 days Saturday and Sunday. She started to feel better Monday, but had chest pain last night and presented to ED.  Pain described as heavy to pressure like and sharp. Worse with movement of body and unable to sit up in bed without reproducing pain.    Evaluation in the emergency room included an EKG with no acute ST-T changes, negative cardiac markers, clear chest x-ray, and rather unremarkable initial laboratory studies. Rewiew of Systems:  The patient denies anorexia, fever, weight loss,, vision loss, decreased hearing, hoarseness, syncope, dyspnea on exertion, peripheral edema, balance deficits, hemoptysis, abdominal pain, melena, hematochezia, hematuria, incontinence, genital sores, muscle weakness, suspicious skin lesions, transient blindness, difficulty walking, depression, unusual weight change, abnormal bleeding, enlarged lymph nodes, angioedema, and breast masses.   Past Medical History  Diagnosis Date  . Hypertension   . Edema   . Pain in limb   . Back pain   . Asthma   . MVP (mitral valve prolapse) 12/22/2011  . S/P dilatation of esophageal stricture 12/22/2011    Past Surgical History  Procedure Date  . Abdominal hysterectomy   . Cardiac catheterization   . Tonsillectomy     Medications:  HOME MEDS: Prior to Admission medications   Medication Sig Start Date End Date Taking?  Authorizing Provider  Enalapril-Hydrochlorothiazide 5-12.5 MG per tablet Take 1 tablet by mouth daily.   Yes Historical Provider, MD  Furosemide (LASIX PO) Take 20 mg by mouth as needed.    Yes Historical Provider, MD     Allergies:  Allergies  Allergen Reactions  . Prednisone   . Protonix     Social History:   reports that she has never smoked. She has never used smokeless tobacco. She reports that she does not drink alcohol. Her drug history not on file. 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 She has children and has been separated for 6 years.   Family History: Family History  Problem Relation Age of Onset  . Diabetes Mother   . Hypertension Mother   . Diabetes Father   . Hypertension Father   . Stroke Other   . Coronary artery disease Other      Physical Exam: Filed Vitals:   12/22/11 1033 12/22/11 1133 12/22/11 1515 12/22/11 1528  BP: 139/90 138/90 118/64 118/70  Pulse: 66 75  68  Temp:    98.4 F (36.9 C)  TempSrc:    Oral  Resp: 18 19  21   Weight:      SpO2:  99%  100%   Blood pressure 118/70, pulse 68, temperature 98.4 F (36.9 C), temperature source Oral, resp. rate 21, weight 89.812 kg (198 lb), SpO2 100.00%.  GEN:  Pleasant person lying in the stretcher in no acute distress; cooperative with exam PSYCH:  alert and oriented x4; does not appear anxious does not appear depressed; affect is normal HEENT: Mucous membranes pink and  anicteric; PERRLA; EOM intact; no cervical lymphadenopathy nor thyromegaly or carotid bruit; no JVD; Breasts:: Not examined CHEST WALL: There is tenderness in the parasternal-costal junction on the left side and pain is reporducible. CHEST: Normal respiration, clear to auscultation bilaterally HEART: Regular rate and rhythm; no murmurs rubs or gallops BACK: No kyphosis or scoliosis; no CVA tenderness ABDOMEN: Obese, soft non-tender; no masses, no organomegaly, normal abdominal bowel sounds; no  pannus; no intertriginous candida. Rectal Exam: Not done EXTREMITIES: No bone or joint deformity; age-appropriate arthropathy of the hands and knees; no edema; no ulcerations. There is no calf tenderness. Genitalia: not examined PULSES: 2+ and symmetric SKIN: Normal hydration no rash or ulceration CNS: Cranial nerves 2-12 grossly intact no focal neurologic deficit   Labs & Imaging Results for orders placed during the hospital encounter of 12/22/11 (from the past 48 hour(s))  CBC     Status: Normal   Collection Time   12/22/11  3:18 AM      Component Value Range Comment   WBC 7.3  4.0 - 10.5 (K/uL)    RBC 4.11  3.87 - 5.11 (MIL/uL)    Hemoglobin 12.6  12.0 - 15.0 (g/dL)    HCT 16.1  09.6 - 04.5 (%)    MCV 90.5  78.0 - 100.0 (fL)    MCH 30.7  26.0 - 34.0 (pg)    MCHC 33.9  30.0 - 36.0 (g/dL)    RDW 40.9  81.1 - 91.4 (%)    Platelets 179  150 - 400 (K/uL)   DIFFERENTIAL     Status: Normal   Collection Time   12/22/11  3:18 AM      Component Value Range Comment   Neutrophils Relative 60  43 - 77 (%)    Neutro Abs 4.4  1.7 - 7.7 (K/uL)    Lymphocytes Relative 29  12 - 46 (%)    Lymphs Abs 2.1  0.7 - 4.0 (K/uL)    Monocytes Relative 7  3 - 12 (%)    Monocytes Absolute 0.5  0.1 - 1.0 (K/uL)    Eosinophils Relative 4  0 - 5 (%)    Eosinophils Absolute 0.3  0.0 - 0.7 (K/uL)    Basophils Relative 0  0 - 1 (%)    Basophils Absolute 0.0  0.0 - 0.1 (K/uL)   BASIC METABOLIC PANEL     Status: Normal   Collection Time   12/22/11  3:18 AM      Component Value Range Comment   Sodium 135  135 - 145 (mEq/L)    Potassium 3.6  3.5 - 5.1 (mEq/L)    Chloride 99  96 - 112 (mEq/L)    CO2 27  19 - 32 (mEq/L)    Glucose, Bld 97  70 - 99 (mg/dL)    BUN 17  6 - 23 (mg/dL)    Creatinine, Ser 7.82  0.50 - 1.10 (mg/dL)    Calcium 9.4  8.4 - 10.5 (mg/dL)    GFR calc non Af Amer >90  >90 (mL/min)    GFR calc Af Amer >90  >90 (mL/min)   POCT I-STAT TROPONIN I     Status: Normal   Collection Time    12/22/11  3:30 AM      Component Value Range Comment   Troponin i, poc 0.00  0.00 - 0.08 (ng/mL)    Comment 3            D-DIMER, QUANTITATIVE     Status:  Normal   Collection Time   12/22/11  4:42 AM      Component Value Range Comment   D-Dimer, Quant 0.33  0.00 - 0.48 (ug/mL-FEU)   TSH     Status: Normal   Collection Time   12/22/11 10:00 AM      Component Value Range Comment   TSH 0.938  0.350 - 4.500 (uIU/mL)   CARDIAC PANEL(CRET KIN+CKTOT+MB+TROPI)     Status: Abnormal   Collection Time   12/22/11 10:00 AM      Component Value Range Comment   Total CK 184 (*) 7 - 177 (U/L)    CK, MB 2.0  0.3 - 4.0 (ng/mL)    Troponin I <0.30  <0.30 (ng/mL)    Relative Index 1.1  0.0 - 2.5     Dg Chest 2 View  12/22/2011  *RADIOLOGY REPORT*  Clinical Data: Sudden onset of chest pain and shortness of breath; recent upper endoscopy.  CHEST - 2 VIEW  Comparison: Chest radiograph performed 02/25/2011  Findings: The lungs are well-aerated and clear.  There is no evidence of focal opacification, pleural effusion or pneumothorax.  The heart is normal in size; the mediastinal contour is within normal limits.  No acute osseous abnormalities are seen.  The safety pin visualized overlying the mid chest is outside the patient.  IMPRESSION: No acute cardiopulmonary process seen.  Original Report Authenticated By: Tonia Ghent, M.D.      Assessment  1. 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 It should be noted that 3 days ago, she has an upper endoscopy and told to have esophageal stricture and dilatation was performed. She had severe nausea and vomiting for 2 days Saturday and Sunday. She started to feel better Monday, but had chest pain last night and presented to ED.  2. Hypertension.  Rec: Normal EKG and two sets of cardiac markers are negative for myocardial injury. I would treat costochondritis with NSAIDs and PPI for her GERD and esophageal stricture and if next set of cardiac  markers negative, can be discharged home safely. I have given her my contact and she will follow up with me for out patient stress test.  Echo this morning was normal. Thanks for the consult. Unless abnormal cardiac markers, (RN asked to page me) I will follow as op only.   Sherika Kubicki,JAGADEESH R 12/22/2011, 7:38 PM

## 2011-12-22 NOTE — ED Notes (Signed)
Nitro was given to the patient and she acted like it was burning her and refused a second one even though she continued w/CP

## 2011-12-22 NOTE — ED Notes (Signed)
Cardiology MD at bedside.

## 2011-12-22 NOTE — H&P (Signed)
PCP: None   Chief Complaint: Chest pain   HPI: [name] is an 51 y.o. female with history of hyperlipidemia, back pain status post back surgery, hypertension, asthma, was strong family history of premature coronary disease, presents to [place] long with substernal chest pain. Her pain seems to be with movement and nonpleuritic. She did experience slight shortness of breath with deep breathing. She has no fever, chills, cough, abdominal pain, lightheadedness or palpitation. It should be noted that 3 days ago, she has an upper endoscopy and did not have any pain right after the procedure. Evaluation in the emergency room included an EKG with no acute ST-T changes, negative cardiac markers, clear chest x-ray, and rather unremarkable initial laboratory studies. Hospitalist was asked to admit her for cardiac rule out.  Rewiew of Systems:  The patient denies anorexia, fever, weight loss,, vision loss, decreased hearing, hoarseness, syncope, dyspnea on exertion, peripheral edema, balance deficits, hemoptysis, abdominal pain, melena, hematochezia, hematuria, incontinence, genital sores, muscle weakness, suspicious skin lesions, transient blindness, difficulty walking, depression, unusual weight change, abnormal bleeding, enlarged lymph nodes, angioedema, and breast masses.   Past Medical History  Diagnosis Date  . Hypertension   . Edema   . Pain in limb   . Back pain   . Asthma     Past Surgical History  Procedure Date  . Abdominal hysterectomy   . Cardiac catheterization   . Tonsillectomy     Medications:  HOME MEDS: Prior to Admission medications   Medication Sig Start Date End Date Taking? Authorizing Provider  Enalapril-Hydrochlorothiazide 5-12.5 MG per tablet Take 1 tablet by mouth daily.   Yes Historical Provider, MD  Furosemide (LASIX PO) Take 20 mg by mouth as needed.    Yes Historical Provider, MD     Allergies:  Allergies  Allergen Reactions  . Prednisone   . Protonix      Social History:   reports that she has never smoked. She does not have any smokeless tobacco history on file. She reports that she does not drink alcohol. Her drug history not on file. 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 She has children and has been separated for 6 years.   Family History: Family History  Problem Relation Age of Onset  . Diabetes Mother   . Hypertension Mother   . Diabetes Father   . Hypertension Father   . Stroke Other   . Coronary artery disease Other      Physical Exam: Filed Vitals:   12/22/11 0134 12/22/11 0414  BP: 166/97 144/88  Pulse: 65 64  Temp: 98 F (36.7 C) 98.7 F (37.1 C)  TempSrc: Oral Oral  Resp: 20 18  Weight: 89.812 kg (198 lb)   SpO2: 100% 100%   Blood pressure 144/88, pulse 64, temperature 98.7 F (37.1 C), temperature source Oral, resp. rate 18, weight 89.812 kg (198 lb), SpO2 100.00%.  GEN:  Pleasant person lying in the stretcher in no acute distress; cooperative with exam PSYCH:  alert and oriented x4; does not appear anxious does not appear depressed; affect is normal HEENT: Mucous membranes pink and anicteric; PERRLA; EOM intact; no cervical lymphadenopathy nor thyromegaly or carotid bruit; no JVD; Breasts:: Not examined CHEST WALL: There is tenderness in the parasternal-costal junction. CHEST: Normal respiration, clear to auscultation bilaterally HEART: Regular rate and rhythm; no murmurs rubs or gallops BACK: No kyphosis or scoliosis; no CVA tenderness ABDOMEN: Obese, soft non-tender; no masses, no organomegaly, normal abdominal  bowel sounds; no pannus; no intertriginous candida. Rectal Exam: Not done EXTREMITIES: No bone or joint deformity; age-appropriate arthropathy of the hands and knees; no edema; no ulcerations. There is no calf tenderness. Genitalia: not examined PULSES: 2+ and symmetric SKIN: Normal hydration no rash or ulceration CNS: Cranial nerves 2-12 grossly intact no  focal neurologic deficit   Labs & Imaging Results for orders placed during the hospital encounter of 12/22/11 (from the past 48 hour(s))  CBC     Status: Normal   Collection Time   12/22/11  3:18 AM      Component Value Range Comment   WBC 7.3  4.0 - 10.5 (K/uL)    RBC 4.11  3.87 - 5.11 (MIL/uL)    Hemoglobin 12.6  12.0 - 15.0 (g/dL)    HCT 45.4  09.8 - 11.9 (%)    MCV 90.5  78.0 - 100.0 (fL)    MCH 30.7  26.0 - 34.0 (pg)    MCHC 33.9  30.0 - 36.0 (g/dL)    RDW 14.7  82.9 - 56.2 (%)    Platelets 179  150 - 400 (K/uL)   DIFFERENTIAL     Status: Normal   Collection Time   12/22/11  3:18 AM      Component Value Range Comment   Neutrophils Relative 60  43 - 77 (%)    Neutro Abs 4.4  1.7 - 7.7 (K/uL)    Lymphocytes Relative 29  12 - 46 (%)    Lymphs Abs 2.1  0.7 - 4.0 (K/uL)    Monocytes Relative 7  3 - 12 (%)    Monocytes Absolute 0.5  0.1 - 1.0 (K/uL)    Eosinophils Relative 4  0 - 5 (%)    Eosinophils Absolute 0.3  0.0 - 0.7 (K/uL)    Basophils Relative 0  0 - 1 (%)    Basophils Absolute 0.0  0.0 - 0.1 (K/uL)   BASIC METABOLIC PANEL     Status: Normal   Collection Time   12/22/11  3:18 AM      Component Value Range Comment   Sodium 135  135 - 145 (mEq/L)    Potassium 3.6  3.5 - 5.1 (mEq/L)    Chloride 99  96 - 112 (mEq/L)    CO2 27  19 - 32 (mEq/L)    Glucose, Bld 97  70 - 99 (mg/dL)    BUN 17  6 - 23 (mg/dL)    Creatinine, Ser 1.30  0.50 - 1.10 (mg/dL)    Calcium 9.4  8.4 - 10.5 (mg/dL)    GFR calc non Af Amer >90  >90 (mL/min)    GFR calc Af Amer >90  >90 (mL/min)   POCT I-STAT TROPONIN I     Status: Normal   Collection Time   12/22/11  3:30 AM      Component Value Range Comment   Troponin i, poc 0.00  0.00 - 0.08 (ng/mL)    Comment 3            D-DIMER, QUANTITATIVE     Status: Normal   Collection Time   12/22/11  4:42 AM      Component Value Range Comment   D-Dimer, Quant 0.33  0.00 - 0.48 (ug/mL-FEU)    Dg Chest 2 View  12/22/2011  *RADIOLOGY REPORT*  Clinical  Data: Sudden onset of chest pain and shortness of breath; recent upper endoscopy.  CHEST - 2 VIEW  Comparison: Chest radiograph performed 02/25/2011  Findings: The lungs are well-aerated and clear.  There is no evidence of focal opacification, pleural effusion or pneumothorax.  The heart is normal in size; the mediastinal contour is within normal limits.  No acute osseous abnormalities are seen.  The safety pin visualized overlying the mid chest is outside the patient.  IMPRESSION: No acute cardiopulmonary process seen.  Original Report Authenticated By: Tonia Ghent, M.D.      Assessment Present on Admission:  .Hypertension .Atypical chest pain .Hyperlipidemia  PLAN: She present with atypical chest pain with negative EKG, cardiac markers, and chest x-ray. She does have significant increased risk for coronary disease including hypertension, hyperlipidemia, sedentary life, and strong family history of coronary disease. Will admit her for cardiac rule out with serial CPKs and troponins. Will obtain a cardiac echo. I will go ahead and start her on Lipitor and check the fasting lipid profile. Her blood pressure is too high and I will start her on lisinopril. She will be given an aspirin. Heart rate is rather low and that preclude the use of any beta blocker for now. She is stable, full code, and will be admitted to triad hospitalist service. I did discuss cardiac risk modification at length with her as well.   Other plans as per orders.    Breanna Shorkey 12/22/2011, 5:54 AM

## 2011-12-22 NOTE — ED Notes (Signed)
Pt placed in room by triage nurse and MD into see her.  She is awake and alert skin W&D no acute distress noted

## 2011-12-22 NOTE — Progress Notes (Signed)
I have seen and assessed patient and agree with Dr Conley Rolls assessment and plan. Patient still c/o CP described as squeezing sensation. Patient with multiple risk factors of HTN, hyperlipidemia, MVP, family hx of premature CAD. Enzymes pending. 2 d echo pending. Will consult with cardiology for further eval and rxcs. Inpatient vs outpatient stress test.

## 2011-12-22 NOTE — ED Notes (Signed)
Patient states that around 6pm she started feeling tightness around her left chest area and she thought it was gas but the pain has progressed and not going away.  Patient states that pain radiates to her neck, shoulder left, left jaw, and arm. Slight nausea with feeling of wanting to belch and slight dizziness, "swimmy headed."

## 2011-12-22 NOTE — Progress Notes (Signed)
*  PRELIMINARY RESULTS* Echocardiogram 2D Echocardiogram has been performed.  Glean Salen [facility] 12/22/2011, 1:14 PM

## 2011-12-22 NOTE — ED Provider Notes (Signed)
History     CSN: [id number]  Arrival date & time 12/22/11  5621   First MD Initiated Contact with Patient 12/22/11 [phone]      Chief Complaint  Patient presents with  . Chest Pain     HPI  History provided by the patient. Patient is a 51 year old African American female with history of hypertension presents with complaints of left chest pain and pressure that began around 6 PM yesterday evening. She reports the pain began acutely has been persistent. It is a tightness and pressure that radiates to the left neck and left upper extremity area. Patient states that she first thought symptoms may be indigestion and tried taking masses for this without relief of symptoms. Patient was trying to fall asleep but was uncomfortable in any position she tried to lie down. Patient denies having any associated nausea, diaphoresis or shortness of breath. Patient denies any strenuous activity or injury. She also reports having a recent upper endoscopy performed 3 days ago, on Friday. Patient denies having similar symptoms following the procedure. Patient does report having similar symptoms many years ago and also has history of cardiac catheterization or 15 years ago. Patient does have a family history of coronary artery disease and heart attacks. Patient denies any pleuritic pains, facets, prior history of DVT, prior history of cancer, recent surgery, prolonged sitting or recent long travel.    Past Medical History  Diagnosis Date  . Hypertension   . Edema   . Pain in limb   . Back pain   . Asthma     Past Surgical History  Procedure Date  . Abdominal hysterectomy   . Cardiac catheterization   . Tonsillectomy     Family History  Problem Relation Age of Onset  . Diabetes Mother   . Hypertension Mother   . Diabetes Father   . Hypertension Father   . Stroke Other   . Coronary artery disease Other     History  Substance Use Topics  . Smoking status: Never Smoker   . Smokeless tobacco: Not  on file  . Alcohol Use: No    OB History    Grav Para Term Preterm Abortions TAB SAB Ect Mult Living                  Review of Systems  Constitutional: Negative for fever and chills.  Respiratory: Negative for cough and shortness of breath.   Cardiovascular: Positive for chest pain. Negative for palpitations.  Gastrointestinal: Negative for nausea, vomiting, abdominal pain and constipation.  Neurological: Positive for light-headedness.  All other systems reviewed and are negative.    Allergies  Prednisone and Protonix  Home Medications   Current Outpatient Rx  Name Route Sig Dispense Refill  . ENALAPRIL-HYDROCHLOROTHIAZIDE 5-12.5 MG PO TABS Oral Take 1 tablet by mouth daily.    Marland Kitchen LASIX PO Oral Take 20 mg by mouth as needed.       BP 166/97  Pulse 65  Temp(Src) 98 F (36.7 C) (Oral)  Resp 20  Wt 198 lb (89.812 kg)  SpO2 100%  Physical Exam  Nursing note and vitals reviewed. Constitutional: She is oriented to person, place, and time. She appears well-developed and well-nourished. No distress.  HENT:  Head: Normocephalic and atraumatic.  Cardiovascular: Normal rate and regular rhythm.   Pulmonary/Chest: Effort normal and breath sounds normal. No respiratory distress. She has no wheezes. She has no rales.       Patient has reproducible pains to palpation  over left chest wall  Abdominal: Soft. She exhibits no distension. There is no tenderness. There is no rebound and no guarding.  Neurological: She is alert and oriented to person, place, and time.  Skin: Skin is warm and dry. No rash noted.  Psychiatric: She has a normal mood and affect. Her behavior is normal.    ED Course  Procedures    Results for orders placed during the hospital encounter of 12/22/11  CBC      Component Value Range   WBC 7.3  4.0 - 10.5 (K/uL)   RBC 4.11  3.87 - 5.11 (MIL/uL)   Hemoglobin 12.6  12.0 - 15.0 (g/dL)   HCT 16.1  09.6 - 04.5 (%)   MCV 90.5  78.0 - 100.0 (fL)   MCH 30.7   26.0 - 34.0 (pg)   MCHC 33.9  30.0 - 36.0 (g/dL)   RDW 40.9  81.1 - 91.4 (%)   Platelets 179  150 - 400 (K/uL)  DIFFERENTIAL      Component Value Range   Neutrophils Relative 60  43 - 77 (%)   Neutro Abs 4.4  1.7 - 7.7 (K/uL)   Lymphocytes Relative 29  12 - 46 (%)   Lymphs Abs 2.1  0.7 - 4.0 (K/uL)   Monocytes Relative 7  3 - 12 (%)   Monocytes Absolute 0.5  0.1 - 1.0 (K/uL)   Eosinophils Relative 4  0 - 5 (%)   Eosinophils Absolute 0.3  0.0 - 0.7 (K/uL)   Basophils Relative 0  0 - 1 (%)   Basophils Absolute 0.0  0.0 - 0.1 (K/uL)  BASIC METABOLIC PANEL      Component Value Range   Sodium 135  135 - 145 (mEq/L)   Potassium 3.6  3.5 - 5.1 (mEq/L)   Chloride 99  96 - 112 (mEq/L)   CO2 27  19 - 32 (mEq/L)   Glucose, Bld 97  70 - 99 (mg/dL)   BUN 17  6 - 23 (mg/dL)   Creatinine, Ser 7.82  0.50 - 1.10 (mg/dL)   Calcium 9.4  8.4 - 95.6 (mg/dL)   GFR calc non Af Amer >90  >90 (mL/min)   GFR calc Af Amer >90  >90 (mL/min)  POCT I-STAT TROPONIN I      Component Value Range   Troponin i, poc 0.00  0.00 - 0.08 (ng/mL)   Comment 3           D-DIMER, QUANTITATIVE      Component Value Range   D-Dimer, Quant 0.33  0.00 - 0.48 (ug/mL-FEU)     Dg Chest 2 View  12/22/2011  *RADIOLOGY REPORT*  Clinical Data: Sudden onset of chest pain and shortness of breath; recent upper endoscopy.  CHEST - 2 VIEW  Comparison: Chest radiograph performed 02/25/2011  Findings: The lungs are well-aerated and clear.  There is no evidence of focal opacification, pleural effusion or pneumothorax.  The heart is normal in size; the mediastinal contour is within normal limits.  No acute osseous abnormalities are seen.  The safety pin visualized overlying the mid chest is outside the patient.  IMPRESSION: No acute cardiopulmonary process seen.  Original Report Authenticated By: Tonia Ghent, M.D.     1. Chest pain       MDM  2:50 AM patient seen and evaluated. Patient no acute distress.  4:15AM Pt seen and  discussed with attending physician.  Plan to add d-dimer and may admit for chest pian r/o.  Date: 12/22/2011  Rate: 66  Rhythm: normal sinus rhythm  QRS Axis: normal  Intervals: normal  ST/T Wave abnormalities: normal  Conduction Disutrbances:none  Narrative Interpretation:   Old EKG Reviewed: unchanged from 02/25/2011    Angus Seller, PA 12/22/11 [phone]  Medical screening examination/treatment/procedure(s) were conducted as a shared visit with non-physician practitioner(s) and myself.  I personally evaluated the patient during the encounter. Patient evaluated for chest pain, no obvious ACS but has risk factors for the same. Recent endoscopy but no crepitus and no abnormal findings on chest x-ray as above. Heart regular rate and rhythm lungs clear to auscultation. Some chest tenderness that is not completely reproducible. Labs obtained and reviewed. For shortness of breath and radiating chest pressure, medicine consult for admission cardiac evaluation. Case discussed as above with Dr. Conley Rolls who agrees to emergency department evaluation and admit  Sunnie Nielsen, MD 12/22/11 [phone]

## 2011-12-22 NOTE — ED Notes (Signed)
Patient had an endoscopy on Friday

## 2011-12-22 NOTE — ED Notes (Signed)
Pt refuses anymore Nitro and the MD is aware.  We gave her very low dose of Fentnyl IV and she acted like it did not help at all.  She stated it made her feel funny and not real good I explained it was a very low dose and the MD thought it would help her with her pain since she does not want anymore Nitro.  No acute distress

## 2011-12-23 DIAGNOSIS — R079 Chest pain, unspecified: Secondary | ICD-10-CM

## 2011-12-23 LAB — BASIC METABOLIC PANEL
BUN: 12 mg/dL (ref 6–23)
Chloride: 104 mEq/L (ref 96–112)
Creatinine, Ser: 0.85 mg/dL (ref 0.50–1.10)
Glucose, Bld: 110 mg/dL — ABNORMAL HIGH (ref 70–99)
Potassium: 3.7 mEq/L (ref 3.5–5.1)

## 2011-12-23 LAB — LIPID PANEL
Cholesterol: 148 mg/dL (ref 0–200)
Triglycerides: 162 mg/dL — ABNORMAL HIGH (ref <150)

## 2011-12-23 LAB — CBC
Hemoglobin: 11.9 g/dL — ABNORMAL LOW (ref 12.0–15.0)
MCH: 30.7 pg (ref 26.0–34.0)
MCHC: 33.9 g/dL (ref 30.0–36.0)
Platelets: 176 10*3/uL (ref 150–400)
RBC: 3.88 MIL/uL (ref 3.87–5.11)

## 2011-12-23 LAB — CARDIAC PANEL(CRET KIN+CKTOT+MB+TROPI)
CK, MB: 1.8 ng/mL (ref 0.3–4.0)
Relative Index: 1.4 (ref 0.0–2.5)
Troponin I: <0.3 ng/mL (ref <0.30)

## 2011-12-23 MED ORDER — NON FORMULARY: 40.0000 mg | Freq: Every morning | Status: DC

## 2011-12-23 MED ORDER — HYDROCODONE-ACETAMINOPHEN 5-325 MG PO TABS
1.0000 | ORAL_TABLET | ORAL | Status: DC | PRN
  Administered 2011-12-23 – 2011-12-24 (×4): 1 via ORAL
  Filled 2011-12-23 (×4): qty 1

## 2011-12-23 MED ORDER — ESOMEPRAZOLE MAGNESIUM 40 MG PO CPDR
40.0000 mg | DELAYED_RELEASE_CAPSULE | Freq: Every day | ORAL | Status: DC
  Administered 2011-12-23 – 2011-12-25 (×3): 40 mg via ORAL
  Filled 2011-12-23 (×4): qty 1

## 2011-12-23 MED ORDER — ZOLPIDEM TARTRATE 5 MG PO TABS
5.0000 mg | ORAL_TABLET | Freq: Once | ORAL | Status: AC
  Administered 2011-12-23: 5 mg via ORAL
  Filled 2011-12-23: qty 1

## 2011-12-23 NOTE — Consults (Signed)
Referring Provider: No ref. provider found Primary Care Physician:  Gwynneth Aliment, MD, MD Primary Gastroenterologist:  Dr.  Jaquita Rector for Consultation:  Chest pain  HPI: [name] is a 51 y.o. female **admitted with chest pain.*She describes a tightness and soreness in her anterior chest which is elicited by turning and twisting and bending. She underwent a cardiac workup as described in the EMR which was negative for cardiac ischemia. 5 days ago she underwent upper endoscopy with dilatation. She was pain-free until 3-1/2 days later. She denies dysphagia or odynophagia.   Past Medical History  Diagnosis Date  . Hypertension   . Edema   . Back pain   . Asthma   . MVP (mitral valve prolapse) 12/22/2011  . S/P dilatation of esophageal stricture 12/22/2011    Past Surgical History  Procedure Date  . Abdominal hysterectomy   . Cardiac catheterization   . Tonsillectomy       Prior to Admission medications   Medication Sig Start Date End Date Taking? Authorizing Provider  Enalapril-Hydrochlorothiazide 5-12.5 MG per tablet Take 1 tablet by mouth daily.   Yes Historical Provider, MD  Furosemide (LASIX PO) Take 20 mg by mouth as needed.    Yes Historical Provider, MD    Current Facility-Administered Medications  Medication Dose Route Frequency Provider Last Rate Last Dose  . 0.9 %  sodium chloride infusion  250 mL Intravenous PRN Houston Siren, MD      . aspirin EC tablet 325 mg  325 mg Oral Daily Houston Siren, MD   325 mg at 12/23/11 1147  . enoxaparin (LOVENOX) injection 40 mg  40 mg Subcutaneous Q24H Houston Siren, MD   40 mg at 12/23/11 1149  . famotidine (PEPCID) IVPB 20 mg  20 mg Intravenous Q12H Pamella Pert, MD   20 mg at 12/23/11 1148  . ibuprofen (ADVIL,MOTRIN) tablet 800 mg  800 mg Oral TID Ramiro Harvest, MD   800 mg at 12/23/11 1147  . ketorolac (TORADOL) 30 MG/ML injection 30 mg  30 mg Intravenous Q6H PRN Ramiro Harvest, MD   30 mg at 12/23/11 0827  . lisinopril  (PRINIVIL,ZESTRIL) tablet 20 mg  20 mg Oral Daily Houston Siren, MD   20 mg at 12/23/11 1147  . morphine 2 MG/ML injection 2 mg  2 mg Intravenous Q4H PRN Houston Siren, MD      . nitroGLYCERIN (NITROSTAT) SL tablet 0.4 mg  0.4 mg Sublingual Q5 min PRN Angus Seller, PA   0.4 mg at 12/22/11 1134  . simvastatin (ZOCOR) tablet 20 mg  20 mg Oral Daily Houston Siren, MD   20 mg at 12/23/11 1147  . sodium chloride 0.9 % injection 3 mL  3 mL Intravenous Q12H Houston Siren, MD   3 mL at 12/23/11 1148  . sodium chloride 0.9 % injection 3 mL  3 mL Intravenous Q12H Houston Siren, MD   3 mL at 12/22/11 2144  . sodium chloride 0.9 % injection 3 mL  3 mL Intravenous PRN Houston Siren, MD      . zolpidem Remus Loffler) tablet 5 mg  5 mg Oral Once Haydee Monica, MD   5 mg at 12/23/11 0158  . DISCONTD: morphine 2 MG/ML injection 0.5-1 mg  0.5-1 mg Intravenous Q4H PRN Ramiro Harvest, MD   1 mg at 12/22/11 1845    Allergies as of 12/22/2011 - Review Complete 12/22/2011  Allergen Reaction Noted  . Prednisone  08/19/2011  . Protonix  08/19/2011  Family History  Problem Relation Age of Onset  . Diabetes Mother   . Hypertension Mother   . Diabetes Father   . Hypertension Father   . Stroke Other   . Coronary artery disease Other     History   Social History  . Marital Status: Legally Separated    Spouse Name: N/A    Number of Children: 3  . Years of Education: N/A   Occupational History  . office manager    Social History Main Topics  . Smoking status: Never Smoker   . Smokeless tobacco: Never Used  . Alcohol Use: No  . Drug Use: Not on file  . Sexually Active: No   Other Topics Concern  . Not on file   Social History Narrative  . No narrative on file    Review of Systems: Pertinent positive and negative review of systems were noted in the above history of present illness section. All other review of systems were otherwise negative   Physical Exam: Vital signs in last 24 hours: Temp:  [97.8 F (36.6 C)-98.4 F  (36.9 C)] 97.8 F (36.6 C) (02/20 1300) Pulse Rate:  [59-103] 67  (02/20 1300) Resp:  [16-21] 16  (02/20 1300) BP: (113-134)/(64-82) 113/75 mmHg (02/20 1300) SpO2:  [97 %-100 %] 98 % (02/20 1300) Weight:  [199 lb 8.3 oz (90.5 kg)] 199 lb 8.3 oz (90.5 kg) (02/20 0610)  General:   Alert,  Well-developed, well-nourished, pleasant and cooperative in NAD Head:  Normocephalic and atraumatic. Eyes:  Sclera clear, no icterus.   Conjunctiva pink. Ears:  Normal auditory acuity. Nose:  No deformity, discharge,  or lesions. Mouth:  No deformity or lesions.  Oropharynx pink & moist. Neck:  Supple; no masses or thyromegaly. Lungs:  Clear throughout to auscultation.   No wheezes, crackles, or rhonchi. No acute distress. Heart:  Regular rate and rhythm; no murmurs, clicks, rubs,  or gallops. Abdomen:  Soft, nontender and nondistended. No masses, hepatosplenomegaly or hernias noted. Normal bowel sounds, without guarding, and without rebound.   Rectal:  Deferred until time of colonoscopy.   Msk:  Symmetrical without gross deformities. Normal posture. There is mild tenderness over the manubrium and left anterior chest Pulses:  Normal pulses noted. Extremities:  Without clubbing or edema. Neurologic:  Alert and  oriented x4;  grossly normal neurologically. Skin:  Intact without significant lesions or rashes. Cervical Nodes:  No significant cervical adenopathy. Psych:  Alert and cooperative. Normal mood and affect.  Intake/Output from previous day: 02/19 0701 - 02/20 0700 In: 410 [P.O.:360; IV Piggyback:50] Out: 400 [Urine:400] Intake/Output this shift:    Lab Results:  Basename 12/23/11 0145 12/22/11 0318  WBC 7.1 7.3  HGB 11.9* 12.6  HCT 35.1* 37.2  PLT 176 179   BMET  Basename 12/23/11 0145 12/22/11 0318  NA 137 135  K 3.7 3.6  CL 104 99  CO2 25 27  GLUCOSE 110* 97  BUN 12 17  CREATININE 0.85 0.78  CALCIUM 8.6 9.4   LFT No results found for this basename:  PROT,ALBUMIN,AST,ALT,ALKPHOS,BILITOT,BILIDIR,IBILI in the last 72 hours PT/INR No results found for this basename: LABPROT:2,INR:2 in the last 72 hours Hepatitis Panel No results found for this basename: HEPBSAG,HCVAB,HEPAIGM,HEPBIGM in the last 72 hours Additional Labs   Studies/Results: Dg Chest 2 View  12/22/2011  *RADIOLOGY REPORT*  Clinical Data: Sudden onset of chest pain and shortness of breath; recent upper endoscopy.  CHEST - 2 VIEW  Comparison: Chest radiograph performed 02/25/2011  Findings:  The lungs are well-aerated and clear.  There is no evidence of focal opacification, pleural effusion or pneumothorax.  The heart is normal in size; the mediastinal contour is within normal limits.  No acute osseous abnormalities are seen.  The safety pin visualized overlying the mid chest is outside the patient.  IMPRESSION: No acute cardiopulmonary process seen.  Original Report Authenticated By: Tonia Ghent, M.D.    Principal Problem:  *Atypical chest pain Active Problems:  Hypertension  Hyperlipidemia  MVP (mitral valve prolapse)  S/P dilatation of esophageal stricture   Recommendations - *trial of NSAIDs by mouth and local heat. No GI studies at this point. Begin Protonix.*  Assessment - *chest pain is most likely musculoskeletal pain. I doubt this is related to visceral pain such as esophageal spasm or esophagitis. It is also very unlikely that she has suffered a complication from her recent dilatation.Molly Maduro D. Arlyce Dice, MD, [facility] [phone]    12/23/2011, 2:34 PM

## 2011-12-23 NOTE — Progress Notes (Signed)
UR CHART REVIEWED; B Taylr Meuth RN, BSN, MHA 

## 2011-12-23 NOTE — Progress Notes (Signed)
Subjective:  SUBSTERNAL TIGHTNESS, INTERMITTENT.  Objective: Weight change: 0.688 kg (1 lb 8.3 oz)  Intake/Output Summary (Last 24 hours) at 12/23/11 1255 Last data filed at 12/23/11 0630  Gross per 24 hour  Intake    410 ml  Output    400 ml  Net     10 ml    Filed Vitals:   12/23/11 0610  BP: 134/82  Pulse: 59  Temp: 98.1 F (36.7 C)  Resp: 16   Alert Afebrile and comfortable.  CHEST: Normal respiration, clear to auscultation bilaterally HEART: Regular rate and rhythm; no murmurs rubs or gallops ABDOMEN: Obese, soft non-tender; no masses, no organomegaly, normal abdominal bowel sounds; EXTREMITIES:no edema; cyanosis or clubbing.      Lab Results: Results for orders placed during the hospital encounter of 12/22/11 (from the past 24 hour(s))  CARDIAC PANEL(CRET KIN+CKTOT+MB+TROPI)     Status: Normal   Collection Time   12/22/11  7:10 PM      Component Value Range   Total CK 160  7 - 177 (U/L)   CK, MB 1.8  0.3 - 4.0 (ng/mL)   Troponin I <0.30  <0.30 (ng/mL)   Relative Index 1.1  0.0 - 2.5   CARDIAC PANEL(CRET KIN+CKTOT+MB+TROPI)     Status: Normal   Collection Time   12/23/11  1:45 AM      Component Value Range   Total CK 132  7 - 177 (U/L)   CK, MB 1.8  0.3 - 4.0 (ng/mL)   Troponin I <0.30  <0.30 (ng/mL)   Relative Index 1.4  0.0 - 2.5   CBC     Status: Abnormal   Collection Time   12/23/11  1:45 AM      Component Value Range   WBC 7.1  4.0 - 10.5 (K/uL)   RBC 3.88  3.87 - 5.11 (MIL/uL)   Hemoglobin 11.9 (*) 12.0 - 15.0 (g/dL)   HCT 13.0 (*) 86.5 - 46.0 (%)   MCV 90.5  78.0 - 100.0 (fL)   MCH 30.7  26.0 - 34.0 (pg)   MCHC 33.9  30.0 - 36.0 (g/dL)   RDW 78.4  69.6 - 29.5 (%)   Platelets 176  150 - 400 (K/uL)  LIPID PANEL     Status: Abnormal   Collection Time   12/23/11  1:45 AM      Component Value Range   Cholesterol 148  0 - 200 (mg/dL)   Triglycerides 284 (*) <150 (mg/dL)   HDL 47  >13 (mg/dL)   Total CHOL/HDL Ratio 3.1     VLDL 32  0 - 40  (mg/dL)   LDL Cholesterol 69  0 - 99 (mg/dL)  BASIC METABOLIC PANEL     Status: Abnormal   Collection Time   12/23/11  1:45 AM      Component Value Range   Sodium 137  135 - 145 (mEq/L)   Potassium 3.7  3.5 - 5.1 (mEq/L)   Chloride 104  96 - 112 (mEq/L)   CO2 25  19 - 32 (mEq/L)   Glucose, Bld 110 (*) 70 - 99 (mg/dL)   BUN 12  6 - 23 (mg/dL)   Creatinine, Ser 2.44  0.50 - 1.10 (mg/dL)   Calcium 8.6  8.4 - 01.0 (mg/dL)   GFR calc non Af Amer 79 (*) >90 (mL/min)   GFR calc Af Amer >90  >90 (mL/min)     Micro Results: No results found for this or any previous visit (from  the past 240 hour(s)).  Studies/Results: Dg Chest 2 View  12/22/2011  *RADIOLOGY REPORT*  Clinical Data: Sudden onset of chest pain and shortness of breath; recent upper endoscopy.  CHEST - 2 VIEW  Comparison: Chest radiograph performed 02/25/2011  Findings: The lungs are well-aerated and clear.  There is no evidence of focal opacification, pleural effusion or pneumothorax.  The heart is normal in size; the mediastinal contour is within normal limits.  No acute osseous abnormalities are seen.  The safety pin visualized overlying the mid chest is outside the patient.  IMPRESSION: No acute cardiopulmonary process seen.  Original Report Authenticated By: Tonia Ghent, M.D.   Medications: Scheduled Meds:   . aspirin EC  325 mg Oral Daily  . enoxaparin  40 mg Subcutaneous Q24H  . famotidine (PEPCID) IV  20 mg Intravenous Q12H  . ibuprofen  800 mg Oral TID  . lisinopril  20 mg Oral Daily  . simvastatin  20 mg Oral Daily  . sodium chloride  3 mL Intravenous Q12H  . sodium chloride  3 mL Intravenous Q12H  . zolpidem  5 mg Oral Once   Continuous Infusions:  PRN Meds:.sodium chloride, ketorolac, morphine, nitroGLYCERIN, sodium chloride, DISCONTD:  morphine injection, DISCONTD:  morphine injection  Assessment/Plan: Patient Active Hospital Problem List: Atypical chest pain (08/19/2011) COSTOCHONDRITIS vs esophageal  spasms. On Ibuprofen and pepcid. Appreciate cardiology recommendations. Cardiac enzymes negative, with no ST-T wave changes. Echo within normal limits.  Outpatient stress test .  Hypertension () Controlled.  Hyperlipidemia (12/22/2011) Stable on zocor.  MVP (mitral valve prolapse) (12/22/2011)  S/P dilatation of esophageal stricture (12/22/2011) Pain control. Gi consult called to see if she is having esophageal spasms.    LOS: 1 day   [name] 12/23/2011, 12:55 PM

## 2011-12-24 LAB — CBC
HCT: 35.4 % — ABNORMAL LOW (ref 36.0–46.0)
Platelets: 165 10*3/uL (ref 150–400)
RDW: 12.4 % (ref 11.5–15.5)
WBC: 6.7 10*3/uL (ref 4.0–10.5)

## 2011-12-24 LAB — BASIC METABOLIC PANEL
Chloride: 103 mEq/L (ref 96–112)
GFR calc Af Amer: 79 mL/min — ABNORMAL LOW (ref >90)
Potassium: 4.1 mEq/L (ref 3.5–5.1)
Sodium: 135 mEq/L (ref 135–145)

## 2011-12-24 MED ORDER — METHYLPREDNISOLONE 4 MG PO KIT: 8.0000 mg | PACK | Freq: Every evening | ORAL | Status: DC

## 2011-12-24 MED ORDER — METHYLPREDNISOLONE 4 MG PO KIT
4.0000 mg | PACK | ORAL | Status: AC
  Administered 2011-12-24: 4 mg via ORAL

## 2011-12-24 MED ORDER — METHYLPREDNISOLONE 4 MG PO KIT
8.0000 mg | PACK | Freq: Every morning | ORAL | Status: AC
  Administered 2011-12-24: 8 mg via ORAL
  Filled 2011-12-24: qty 21

## 2011-12-24 MED ORDER — METHYLPREDNISOLONE 4 MG PO KIT: 4.0000 mg | PACK | Freq: Three times a day (TID) | ORAL | Status: DC

## 2011-12-24 MED ORDER — METHYLPREDNISOLONE 4 MG PO KIT: 4.0000 mg | PACK | ORAL | Status: DC

## 2011-12-24 MED ORDER — CLARITHROMYCIN 500 MG PO TABS
500.0000 mg | ORAL_TABLET | Freq: Two times a day (BID) | ORAL | Status: DC
  Administered 2011-12-24 – 2011-12-25 (×2): 500 mg via ORAL
  Filled 2011-12-24 (×5): qty 1

## 2011-12-24 MED ORDER — METHYLPREDNISOLONE 4 MG PO KIT
4.0000 mg | PACK | Freq: Three times a day (TID) | ORAL | Status: AC
  Administered 2011-12-25 (×3): 4 mg via ORAL

## 2011-12-24 MED ORDER — METHYLPREDNISOLONE 4 MG PO KIT: 4.0000 mg | PACK | Freq: Four times a day (QID) | ORAL | Status: DC

## 2011-12-24 MED ORDER — METRONIDAZOLE 250 MG PO TABS
250.0000 mg | ORAL_TABLET | Freq: Three times a day (TID) | ORAL | Status: DC
  Administered 2011-12-24 – 2011-12-25 (×2): 250 mg via ORAL
  Filled 2011-12-24 (×5): qty 1

## 2011-12-24 MED ORDER — METHYLPREDNISOLONE 4 MG PO KIT: 8.0000 mg | PACK | Freq: Every morning | ORAL | Status: DC

## 2011-12-24 MED ORDER — METHYLPREDNISOLONE 4 MG PO KIT
8.0000 mg | PACK | Freq: Every evening | ORAL | Status: AC
  Administered 2011-12-24: 8 mg via ORAL

## 2011-12-24 NOTE — Progress Notes (Signed)
Pt. Refused to have bladder scan done, stated that she has being urinating regularly and doesn't need it, prn pain med given for left lower bank pain.

## 2011-12-24 NOTE — Progress Notes (Signed)
Still c/o considerable chest discomfort requiring narcotics.  Rwcommend medrol dose pack.

## 2011-12-24 NOTE — Progress Notes (Signed)
Subjective: Pain is not better it is 5/10 today.   Objective: Weight change: -0.8 kg (-1 lb 12.2 oz)  Intake/Output Summary (Last 24 hours) at 12/24/11 1626 Last data filed at 12/24/11 1527  Gross per 24 hour  Intake    363 ml  Output   2000 ml  Net  -1637 ml    Filed Vitals:   12/24/11 1445  BP: 107/70  Pulse: 65  Temp: 98.5 F (36.9 C)  Resp: 18  Alert Afebrile and comfortable.  CHEST: Normal respiration, clear to auscultation bilaterally  HEART: Regular rate and rhythm; no murmurs rubs or gallops  ABDOMEN: Obese, soft non-tender; no masses, no organomegaly, normal abdominal bowel sounds;  EXTREMITIES:no edema; cyanosis or clubbing.     Lab Results: Results for orders placed during the hospital encounter of 12/22/11 (from the past 24 hour(s))  CBC     Status: Abnormal   Collection Time   12/24/11  5:10 AM      Component Value Range   WBC 6.7  4.0 - 10.5 (K/uL)   RBC 3.88  3.87 - 5.11 (MIL/uL)   Hemoglobin 11.9 (*) 12.0 - 15.0 (g/dL)   HCT 54.0 (*) 98.1 - 46.0 (%)   MCV 91.2  78.0 - 100.0 (fL)   MCH 30.7  26.0 - 34.0 (pg)   MCHC 33.6  30.0 - 36.0 (g/dL)   RDW 19.1  47.8 - 29.5 (%)   Platelets 165  150 - 400 (K/uL)  BASIC METABOLIC PANEL     Status: Abnormal   Collection Time   12/24/11  5:10 AM      Component Value Range   Sodium 135  135 - 145 (mEq/L)   Potassium 4.1  3.5 - 5.1 (mEq/L)   Chloride 103  96 - 112 (mEq/L)   CO2 25  19 - 32 (mEq/L)   Glucose, Bld 94  70 - 99 (mg/dL)   BUN 11  6 - 23 (mg/dL)   Creatinine, Ser 6.21  0.50 - 1.10 (mg/dL)   Calcium 8.6  8.4 - 30.8 (mg/dL)   GFR calc non Af Amer 68 (*) >90 (mL/min)   GFR calc Af Amer 79 (*) >90 (mL/min)     Micro Results: No results found for this or any previous visit (from the past 240 hour(s)).  Studies/Results: Dg Chest 2 View  12/22/2011  *RADIOLOGY REPORT*  Clinical Data: Sudden onset of chest pain and shortness of breath; recent upper endoscopy.  CHEST - 2 VIEW  Comparison: Chest  radiograph performed 02/25/2011  Findings: The lungs are well-aerated and clear.  There is no evidence of focal opacification, pleural effusion or pneumothorax.  The heart is normal in size; the mediastinal contour is within normal limits.  No acute osseous abnormalities are seen.  The safety pin visualized overlying the mid chest is outside the patient.  IMPRESSION: No acute cardiopulmonary process seen.  Original Report Authenticated By: Tonia Ghent, M.D.   Medications: Scheduled Meds:   . aspirin EC  325 mg Oral Daily  . enoxaparin  40 mg Subcutaneous Q24H  . esomeprazole  40 mg Oral QAC breakfast  . ibuprofen  800 mg Oral TID  . lisinopril  20 mg Oral Daily  . methylPREDNISolone  4 mg Oral PC lunch  . methylPREDNISolone  4 mg Oral PC supper  . methylPREDNISolone  4 mg Oral 3 x daily with food  . methylPREDNISolone  4 mg Oral 4X daily taper  . methylPREDNISolone  8 mg Oral AC breakfast  .  methylPREDNISolone  8 mg Oral Nightly  . methylPREDNISolone  8 mg Oral Nightly  . simvastatin  20 mg Oral Daily  . sodium chloride  3 mL Intravenous Q12H  . sodium chloride  3 mL Intravenous Q12H  . DISCONTD: methylPREDNISolone  4 mg Oral PC lunch  . DISCONTD: methylPREDNISolone  4 mg Oral PC supper  . DISCONTD: methylPREDNISolone  4 mg Oral 3 x daily with food  . DISCONTD: methylPREDNISolone  4 mg Oral 4X daily taper  . DISCONTD: methylPREDNISolone  8 mg Oral AC breakfast  . DISCONTD: methylPREDNISolone  8 mg Oral Nightly  . DISCONTD: methylPREDNISolone  8 mg Oral Nightly   Continuous Infusions:  PRN Meds:.sodium chloride, HYDROcodone-acetaminophen, morphine, nitroGLYCERIN, sodium chloride  Assessment/Plan: Atypical chest pain (08/19/2011) COSTOCHONDRITIS . On Ibuprofen and pepcid. Appreciate cardiology recommendations. And GI recommendations.  Cardiac enzymes negative, with no ST-T wave changes. Echo within normal limits.  Outpatient stress test . Started her on medrol dose pack.    Hypertension () Controlled.  Hyperlipidemia (12/22/2011) Stable on zocor.  MVP (mitral valve prolapse) (12/22/2011)  S/P dilatation of esophageal stricture (12/22/2011) As per the patient she was told that she is H Pylori positive. We will start the triple therapy tonight.     LOS: 2 days   Noris Kulinski 12/24/2011, 4:26 PM

## 2011-12-25 ENCOUNTER — Observation Stay (HOSPITAL_COMMUNITY): Payer: PRIVATE HEALTH INSURANCE

## 2011-12-25 MED ORDER — ATORVASTATIN CALCIUM 10 MG PO TABS: 10.0000 mg | ORAL_TABLET | Freq: Every day | ORAL | Status: DC

## 2011-12-25 MED ORDER — ESOMEPRAZOLE MAGNESIUM 40 MG PO CPDR: 40.0000 mg | DELAYED_RELEASE_CAPSULE | Freq: Every day | ORAL | Status: DC

## 2011-12-25 MED ORDER — AMOXICILLIN 500 MG PO CAPS: 1000.0000 mg | ORAL_CAPSULE | Freq: Two times a day (BID) | ORAL | Status: AC

## 2011-12-25 MED ORDER — METHYLPREDNISOLONE 4 MG PO KIT: PACK | ORAL | Status: AC

## 2011-12-25 MED ORDER — AMOXICILLIN 500 MG PO CAPS
1000.0000 mg | ORAL_CAPSULE | Freq: Two times a day (BID) | ORAL | Status: DC
  Administered 2011-12-25: 1000 mg via ORAL
  Filled 2011-12-25 (×5): qty 2

## 2011-12-25 MED ORDER — CLARITHROMYCIN 500 MG PO TABS: 500.0000 mg | ORAL_TABLET | Freq: Two times a day (BID) | ORAL | Status: AC

## 2011-12-25 MED ORDER — HYDROCODONE-ACETAMINOPHEN 5-325 MG PO TABS: 1.0000 | ORAL_TABLET | ORAL | Status: AC | PRN

## 2011-12-25 MED ORDER — IBUPROFEN 800 MG PO TABS: 800.0000 mg | ORAL_TABLET | Freq: Three times a day (TID) | ORAL | Status: AC

## 2011-12-25 MED ORDER — ATORVASTATIN CALCIUM 10 MG PO TABS
10.0000 mg | ORAL_TABLET | Freq: Every day | ORAL | Status: DC
  Administered 2011-12-25: 10 mg via ORAL
  Filled 2011-12-25 (×2): qty 1

## 2011-12-25 NOTE — Progress Notes (Signed)
[facility] Progress Note  SUBJECTIVE: chest pain not any better.   OBJECTIVE:  Vital signs in last 24 hours: Temp:  [98.5 F (36.9 C)-98.8 F (37.1 C)] 98.8 F (37.1 C) (02/22 0522) Pulse Rate:  [54-65] 64  (02/22 0522) Resp:  [18-20] 20  (02/22 0522) BP: (107-120)/(70-78) 109/72 mmHg (02/22 0522) SpO2:  [96 %-100 %] 96 % (02/22 0522) Weight:  [200 lb 9.9 oz (91 kg)] 200 lb 9.9 oz (91 kg) (02/22 0522) Last BM Date: 12/23/11 General:   Pleasant black female in NAD Heart:  Regular rate and rhythm Lungs: Respirations even and unlabored, lungs CTA bilaterally Abdomen:  Soft, nontender and nondistended. Normal bowel sounds. Neurologic:  Alert and oriented,  grossly normal neurologically. Musculoskeletal: No chest wall tenderness. No upper back / posterior neck tenderness Psych:  Cooperative. Normal mood and affect.   Lab Results:  Basename 12/24/11 0510 12/23/11 0145  WBC 6.7 7.1  HGB 11.9* 11.9*  HCT 35.4* 35.1*  PLT 165 176   BMET  Basename 12/24/11 0510 12/23/11 0145  NA 135 137  K 4.1 3.7  CL 103 104  CO2 25 25  GLUCOSE 94 110*  BUN 11 12  CREATININE 0.96 0.85  CALCIUM 8.6 8.6   ASSESSMENT / PLAN:   51 year old black female with acute left sided chest pain and mid sternal chest pain radiating through to upper back and neck. Pain started a couple of days after EGD with esophageal dilation by Dr. Norma Fredrickson. Pain worse with movement but can also wake her up from sleep. No dysphagia or odynophagia. Started Medrol dose pack last night. She is on NSAIDS and PPI but still no relief. Pain seems to be musculoskeletal in nature. No SOB, cardiac evaluation negative. Does she need additional xrays (maybe neck?). Hopefully steroids will begin to work soon   LOS: 3 days   Willette Cluster  12/25/2011, 10:29 AM

## 2011-12-25 NOTE — Progress Notes (Signed)
Subjective: "I still have the pain when i cough or move certain ways". Denies SOB/nausea  Objective: Vital signs Filed Vitals:   12/24/11 1445 12/24/11 2141 12/25/11 0522 12/25/11 1320  BP: 107/70 120/78 109/72 143/87  Pulse: 65 54 64 61  Temp: 98.5 F (36.9 C) 98.5 F (36.9 C) 98.8 F (37.1 C) 99 F (37.2 C)  TempSrc: Oral Oral Oral Oral  Resp: 18 18 20 16   Height:      Weight:   91 kg (200 lb 9.9 oz)   SpO2: 100% 100% 96% 99%   Weight change: 1.3 kg (2 lb 13.9 oz) Last BM Date: 12/23/11  Intake/Output from previous day: 02/21 0701 - 02/22 0700 In: 606 [P.O.:600; I.V.:6] Out: 1500 [Urine:1500] Total I/O In: 480 [P.O.:480] Out: 700 [Urine:700]   Physical Exam: General: Alert, awake, oriented x3, in no acute distress. Sitting on side of bed HEENT: No bruits, no goiter. PERRL mucus membranes moist/pink Heart: Regular rate and rhythm, without murmurs, rubs, gallops. Lungs: Clear to auscultation bilaterally. Abdomen:Obese Soft, nontender, nondistended, positive bowel sounds. Extremities: No clubbing cyanosis or edema with positive pedal pulses. Bilateral grip 5/5 Neuro: Grossly intact, nonfocal. Speech clear. Facial symmetry. Decreased ROM cervical spine. Sensation UE intact.     Lab Results: Basic Metabolic Panel:  Basename 12/24/11 0510 12/23/11 0145  NA 135 137  K 4.1 3.7  CL 103 104  CO2 25 25  GLUCOSE 94 110*  BUN 11 12  CREATININE 0.96 0.85  CALCIUM 8.6 8.6  MG -- --  PHOS -- --   Liver Function Tests: No results found for this basename: AST:2,ALT:2,ALKPHOS:2,BILITOT:2,PROT:2,ALBUMIN:2 in the last 72 hours No results found for this basename: LIPASE:2,AMYLASE:2 in the last 72 hours No results found for this basename: AMMONIA:2 in the last 72 hours CBC:  Basename 12/24/11 0510 12/23/11 0145  WBC 6.7 7.1  NEUTROABS -- --  HGB 11.9* 11.9*  HCT 35.4* 35.1*  MCV 91.2 90.5  PLT 165 176   Cardiac Enzymes:  Basename 12/23/11 0145 12/22/11 1910    CKTOTAL 132 160  CKMB 1.8 1.8  CKMBINDEX -- --  TROPONINI <0.30 <0.30   BNP: No results found for this basename: PROBNP:3 in the last 72 hours D-Dimer: No results found for this basename: DDIMER:2 in the last 72 hours CBG: No results found for this basename: GLUCAP:6 in the last 72 hours Hemoglobin A1C: No results found for this basename: HGBA1C in the last 72 hours Fasting Lipid Panel:  Basename 12/23/11 0145  CHOL 148  HDL 47  LDLCALC 69  TRIG 162*  CHOLHDL 3.1  LDLDIRECT --   Thyroid Function Tests: No results found for this basename: TSH,T4TOTAL,FREET4,T3FREE,THYROIDAB in the last 72 hours Anemia Panel: No results found for this basename: VITAMINB12,FOLATE,FERRITIN,TIBC,IRON,RETICCTPCT in the last 72 hours Coagulation: No results found for this basename: LABPROT:2,INR:2 in the last 72 hours Urine Drug Screen: Drugs of Abuse  No results found for this basename: labopia, cocainscrnur, labbenz, amphetmu, thcu, labbarb    Alcohol Level: No results found for this basename: ETH:2 in the last 72 hours Urinalysis: No results found for this basename: COLORURINE:2,APPERANCEUR:2,LABSPEC:2,PHURINE:2,GLUCOSEU:2,HGBUR:2,BILIRUBINUR:2,KETONESUR:2,PROTEINUR:2,UROBILINOGEN:2,NITRITE:2,LEUKOCYTESUR:2 in the last 72 hours Misc. Labs:  No results found for this or any previous visit (from the past 240 hour(s)).  Studies/Results: No results found.  Medications: Scheduled Meds:   . amoxicillin  1,000 mg Oral Q12H  . aspirin EC  325 mg Oral Daily  . atorvastatin  10 mg Oral q1800  . clarithromycin  500 mg Oral Q12H  .  enoxaparin  40 mg Subcutaneous Q24H  . esomeprazole  40 mg Oral QAC breakfast  . ibuprofen  800 mg Oral TID  . lisinopril  20 mg Oral Daily  . methylPREDNISolone  4 mg Oral PC lunch  . methylPREDNISolone  4 mg Oral PC supper  . methylPREDNISolone  4 mg Oral 3 x daily with food  . methylPREDNISolone  4 mg Oral 4X daily taper  . methylPREDNISolone  8 mg Oral AC  breakfast  . methylPREDNISolone  8 mg Oral Nightly  . methylPREDNISolone  8 mg Oral Nightly  . sodium chloride  3 mL Intravenous Q12H  . sodium chloride  3 mL Intravenous Q12H  . DISCONTD: methylPREDNISolone  4 mg Oral PC lunch  . DISCONTD: methylPREDNISolone  4 mg Oral PC supper  . DISCONTD: methylPREDNISolone  4 mg Oral 3 x daily with food  . DISCONTD: methylPREDNISolone  4 mg Oral 4X daily taper  . DISCONTD: methylPREDNISolone  8 mg Oral AC breakfast  . DISCONTD: methylPREDNISolone  8 mg Oral Nightly  . DISCONTD: methylPREDNISolone  8 mg Oral Nightly  . DISCONTD: metroNIDAZOLE  250 mg Oral Q8H  . DISCONTD: simvastatin  20 mg Oral Daily   Continuous Infusions:  PRN Meds:.sodium chloride, HYDROcodone-acetaminophen, morphine, nitroGLYCERIN, sodium chloride  Assessment/Plan:  Principal Problem:  *Atypical chest pain Active Problems:  Hypertension  Hyperlipidemia  MVP (mitral valve prolapse)  S/P dilatation of esophageal stricture  Atypical chest pain (08/19/2011) Likely COSTOCHONDRITIS . Slight improvement. Pain only with coughing and certain movement.  On Ibuprofen and pepcid. Medrol pack started yesterday.  Cardiac enzymes negative, with no ST-T wave changes. Echo within normal limits. Outpatient stress test . Will also check xray cervical spine as pain increased and radiates to base of post neck and shoulder with flex/ext.   Hypertension :Fair control. Continue home meds.   Hyperlipidemia (12/22/2011)Stable on zocor.   MVP (mitral valve prolapse) (12/22/2011)  S/P dilatation of esophageal stricture (12/22/2011)     LOS: 3 days   [facility] 12/25/2011, 1:45 PM

## 2011-12-25 NOTE — Progress Notes (Signed)
I think she actually is better.  She definitely looks more comfortable.  Would continue medrol dose pack.    Signing off.

## 2011-12-25 NOTE — Discharge Instructions (Addendum)
Chest Pain (Nonspecific) It is often hard to give a specific diagnosis for the cause of chest pain. There is always a chance that your pain could be related to something serious, such as a heart attack or a blood clot in the lungs. You need to follow up with your caregiver for further evaluation. CAUSES   Heartburn.   Pneumonia or bronchitis.   Anxiety and stress.   Inflammation around your heart (pericarditis) or lung (pleuritis or pleurisy).   A blood clot in the lung.   A collapsed lung (pneumothorax). It can develop suddenly on its own (spontaneous pneumothorax) or from injury (trauma) to the chest.  The chest wall is composed of bones, muscles, and cartilage. Any of these can be the source of the pain.  The bones can be bruised by injury.   The muscles or cartilage can be strained by coughing or overwork.   The cartilage can be affected by inflammation and become sore (costochondritis).  DIAGNOSIS  Lab tests or other studies, such as X-rays, an EKG, stress testing, or cardiac imaging, may be needed to find the cause of your pain.  TREATMENT   Treatment depends on what may be causing your chest pain. Treatment may include:   Acid blockers for heartburn.   Anti-inflammatory medicine.   Pain medicine for inflammatory conditions.   Antibiotics if an infection is present.   You may be advised to change lifestyle habits. This includes stopping smoking and avoiding caffeine and chocolate.   You may be advised to keep your head raised (elevated) when sleeping. This reduces the chance of acid going backward from your stomach into your esophagus.   Most of the time, nonspecific chest pain will improve within 2 to 3 days with rest and mild pain medicine.  HOME CARE INSTRUCTIONS   If antibiotics were prescribed, take the full amount even if you start to feel better.   For the next few days, avoid physical activities that bring on chest pain. Continue physical activities as  directed.   Do not smoke cigarettes or drink alcohol until your symptoms are gone.   Only take over-the-counter or prescription medicine for pain, discomfort, or fever as directed by your caregiver.   Follow your caregiver's suggestions for further testing if your chest pain does not go away.   Keep any follow-up appointments you made. If you do not go to an appointment, you could develop lasting (chronic) problems with pain. If there is any problem keeping an appointment, you must call to reschedule.  SEEK MEDICAL CARE IF:   You think you are having problems from the medicine you are taking. Read your medicine instructions carefully.   Your chest pain does not go away, even after treatment.   You develop a rash with blisters on your chest.  SEEK IMMEDIATE MEDICAL CARE IF:   You have increased chest pain or pain that spreads to your arm, neck, jaw, back, or belly (abdomen).   You develop shortness of breath, an increasing cough, or you are coughing up blood.   You have severe back or abdominal pain, feel sick to your stomach (nauseous) or throw up (vomit).   You develop severe weakness, fainting, or chills.   You have an oral temperature above 102 F (38.9 C), not controlled by medicine.  THIS IS AN EMERGENCY. Do not wait to see if the pain will go away. Get medical help at once. Call your local emergency services (911 in [place] Do not drive yourself to   the hospital. MAKE SURE YOU:   Understand these instructions.   Will watch your condition.   Will get help right away if you are not doing well or get worse.  Document Released: 07/29/2005 Document Revised: 07/01/2011 Document Reviewed: 05/24/2008 [facility] Patient Information 2012 [place], [place].Hypertension Hypertension is another name for high blood pressure. High blood pressure may mean that your heart needs to work harder to pump blood. Blood pressure consists of two numbers, which includes a higher number over a lower  number (example: 110/72). HOME CARE   Make lifestyle changes as told by your doctor. This may include weight loss and exercise.   Take your blood pressure medicine every day.   Limit how much salt you use.   Stop smoking if you smoke.   Do not use drugs.   Talk to your doctor if you are using decongestants or birth control pills. These medicines might make blood pressure higher.   Females should not drink more than 1 alcoholic drink per day. Males should not drink more than 2 alcoholic drinks per day.   See your doctor as told.  GET HELP RIGHT AWAY IF:   You have a blood pressure reading with a top number of 180 or higher.   You get a very bad headache.   You get blurred or changing vision.   You feel confused.   You feel weak, numb, or faint.   You get chest or belly (abdominal) pain.   You throw up (vomit).   You cannot breathe very well.  MAKE SURE YOU:   Understand these instructions.   Will watch your condition.   Will get help right away if you are not doing well or get worse.  Document Released: 04/06/2008 Document Revised: 07/01/2011 Document Reviewed: 04/06/2008 [facility] Patient Information 2012 [place], [place].

## 2011-12-25 NOTE — Discharge Summary (Signed)
DISCHARGE SUMMARY  [name]  MR#: [id number]  DOB:08-Jan-1961  Date of Admission: 12/22/2011 Date of Discharge: 12/25/2011  Attending Physician:[name]  Patient's URK:YHCWCBJ,SEGBT N, MD, MD  Consults:Treatment Team:  Karen Pert, MD GI CONSULT.  Discharge Diagnoses: Present on Admission:  .Hypertension .Atypical chest pain .Hyperlipidemia .MVP (mitral valve prolapse)    Medication List  As of 12/25/2011  5:01 PM   TAKE these medications         amoxicillin 500 MG capsule   Commonly known as: AMOXIL   Take 2 capsules (1,000 mg total) by mouth every 12 (twelve) hours.      atorvastatin 10 MG tablet   Commonly known as: LIPITOR   Take 1 tablet (10 mg total) by mouth daily at 6 PM.      clarithromycin 500 MG tablet   Commonly known as: BIAXIN   Take 1 tablet (500 mg total) by mouth every 12 (twelve) hours.      Enalapril-Hydrochlorothiazide 5-12.5 MG per tablet   Take 1 tablet by mouth daily.      esomeprazole 40 MG capsule   Commonly known as: NEXIUM   Take 1 capsule (40 mg total) by mouth daily before breakfast.      HYDROcodone-acetaminophen 5-325 MG per tablet   Commonly known as: NORCO   Take 1 tablet by mouth every 4 (four) hours as needed.      ibuprofen 800 MG tablet   Commonly known as: ADVIL,MOTRIN   Take 1 tablet (800 mg total) by mouth 3 (three) times daily.      LASIX PO   Take 20 mg by mouth as needed.      methylPREDNISolone 4 MG tablet   Commonly known as: MEDROL DOSEPAK   As directed.      methylPREDNISolone 4 MG tablet   Commonly known as: MEDROL DOSEPAK   As directed Has dose pack with instructions      methylPREDNISolone 4 MG tablet   Commonly known as: MEDROL DOSEPAK   Take as directed. Has dose pack with instructions          Brief  Admit Note: [name] is an 51 y.o. female with history of hyperlipidemia, back pain status post back surgery, hypertension, asthma, was strong family history of premature coronary  disease, presents to [facility] with substernal chest pain. Her pain seems to be with movement and nonpleuritic. She did experience slight shortness of breath with deep breathing. Hospitalist was asked to admit her for cardiac rule out.   Hospital Course: Atypical chest pain (08/19/2011): [name] Kitchen Cardiac enzymes negative, with no ST-T wave changes. Echo within normal limits. Cardiology called and recommended outpatient stress test.  But she was complaining of persistent substernal pain and in view of her recent EGD, GI consult was obtained, with no new recommendations. She was started on medrol dose pack and continued with ibuprofen ad pepcid for possible COSTOCHONDRITIS  Hypertension () Controlled.  Hyperlipidemia (12/22/2011) Stable on zocor.  MVP (mitral valve prolapse) (12/22/2011): STABLE.  S/P dilatation of esophageal stricture (12/22/2011) And she was H pylori positive and she was discharged on 14 day course of clarithromycin and amoxicillin.       Day of Discharge BP 143/87  Pulse 61  Temp(Src) 99 F (37.2 C) (Oral)  Resp 16  Ht 5\' 7"  (1.702 m)  Wt 91 kg (200 lb 9.9 oz)  BMI 31.42 kg/m2  SpO2 99%   Physical Exam:  General: Alert, awake, oriented x3, in no acute  distress. Sitting on side of bed  HEENT: No bruits, no goiter. PERRL mucus membranes moist/pink  Heart: Regular rate and rhythm, without murmurs, rubs, gallops.  Lungs: Clear to auscultation bilaterally.  Abdomen:Obese Soft, nontender, nondistended, positive bowel sounds.  Extremities: No clubbing cyanosis or edema with positive pedal pulses. Bilateral grip 5/5  Neuro: Grossly intact, nonfocal. Speech clear. Facial symmetry. Decreased ROM cervical spine. Sensation UE intact.   No results found for this or any previous visit (from the past 24 hour(s)).  Disposition: Home   Follow-up Appts: Discharge Orders    Future Orders Please Complete By Expires   Diet - low sodium heart healthy      Increase activity  slowly      Call MD for:  persistant nausea and vomiting      Call MD for:  persistant dizziness or light-headedness         Follow-up Information    Follow up with Karen Pert, MD. (Office will call patient for follow up)    Contact information:   [address]  [place] [address] [phone]       Schedule an appointment as soon as possible for a visit in 3 weeks to follow up. (make appointment with Dr. Norma Brady GI in [place] 2-3 weeks)           Time spent in discharge (includes decision making & examination of pt): 45 minutes  Signed: Lonald Brady 12/25/2011, 5:01 PM

## 2012-01-10 ENCOUNTER — Other Ambulatory Visit: Payer: Self-pay

## 2012-01-10 ENCOUNTER — Emergency Department (HOSPITAL_BASED_OUTPATIENT_CLINIC_OR_DEPARTMENT_OTHER)
Admission: EM | Admit: 2012-01-10 | Discharge: 2012-01-10 | Disposition: A | Discharge status: Home / Self Care | Payer: PRIVATE HEALTH INSURANCE | Attending: Emergency Medicine | Admitting: Emergency Medicine

## 2012-01-10 ENCOUNTER — Emergency Department (INDEPENDENT_AMBULATORY_CARE_PROVIDER_SITE_OTHER): Payer: PRIVATE HEALTH INSURANCE

## 2012-01-10 ENCOUNTER — Encounter (HOSPITAL_BASED_OUTPATIENT_CLINIC_OR_DEPARTMENT_OTHER): Payer: Self-pay | Admitting: Emergency Medicine

## 2012-01-10 DIAGNOSIS — Z79899 Other long term (current) drug therapy: Secondary | ICD-10-CM | POA: Insufficient documentation

## 2012-01-10 DIAGNOSIS — R079 Chest pain, unspecified: Secondary | ICD-10-CM

## 2012-01-10 DIAGNOSIS — I517 Cardiomegaly: Secondary | ICD-10-CM

## 2012-01-10 DIAGNOSIS — R1013 Epigastric pain: Secondary | ICD-10-CM | POA: Insufficient documentation

## 2012-01-10 DIAGNOSIS — M549 Dorsalgia, unspecified: Secondary | ICD-10-CM | POA: Insufficient documentation

## 2012-01-10 DIAGNOSIS — J45909 Unspecified asthma, uncomplicated: Secondary | ICD-10-CM | POA: Insufficient documentation

## 2012-01-10 DIAGNOSIS — M62838 Other muscle spasm: Secondary | ICD-10-CM | POA: Insufficient documentation

## 2012-01-10 DIAGNOSIS — R51 Headache: Secondary | ICD-10-CM | POA: Insufficient documentation

## 2012-01-10 DIAGNOSIS — I10 Essential (primary) hypertension: Secondary | ICD-10-CM | POA: Insufficient documentation

## 2012-01-10 DIAGNOSIS — M79609 Pain in unspecified limb: Secondary | ICD-10-CM | POA: Insufficient documentation

## 2012-01-10 DIAGNOSIS — R61 Generalized hyperhidrosis: Secondary | ICD-10-CM | POA: Insufficient documentation

## 2012-01-10 LAB — BASIC METABOLIC PANEL
Chloride: 103 mEq/L (ref 96–112)
GFR calc Af Amer: >90 mL/min (ref >90)
GFR calc non Af Amer: 85 mL/min — ABNORMAL LOW (ref >90)
Glucose, Bld: 102 mg/dL — ABNORMAL HIGH (ref 70–99)
Potassium: 3.8 mEq/L (ref 3.5–5.1)
Sodium: 140 mEq/L (ref 135–145)

## 2012-01-10 LAB — CBC
Hemoglobin: 12.8 g/dL (ref 12.0–15.0)
MCHC: 34.6 g/dL (ref 30.0–36.0)
WBC: 7 10*3/uL (ref 4.0–10.5)

## 2012-01-10 LAB — CARDIAC PANEL(CRET KIN+CKTOT+MB+TROPI)
CK, MB: 1.7 ng/mL (ref 0.3–4.0)
Relative Index: 1.3 (ref 0.0–2.5)
Total CK: 126 U/L (ref 7–177)
Troponin I: <0.3 ng/mL

## 2012-01-10 MED ORDER — DIAZEPAM 5 MG PO TABS: 5.0000 mg | ORAL_TABLET | Freq: Two times a day (BID) | ORAL | Status: AC

## 2012-01-10 MED ORDER — ASPIRIN 81 MG PO CHEW
324.0000 mg | CHEWABLE_TABLET | Freq: Once | ORAL | Status: AC
  Administered 2012-01-10: 324 mg via ORAL
  Filled 2012-01-10: qty 4

## 2012-01-10 MED ORDER — DIAZEPAM 5 MG PO TABS
10.0000 mg | ORAL_TABLET | Freq: Once | ORAL | Status: AC
  Administered 2012-01-10: 10 mg via ORAL
  Filled 2012-01-10: qty 2

## 2012-01-10 MED ORDER — IOHEXOL 350 MG/ML SOLN
80.0000 mL | Freq: Once | INTRAVENOUS | Status: AC | PRN
  Administered 2012-01-10: 80 mL via INTRAVENOUS

## 2012-01-10 MED ORDER — HYDROMORPHONE HCL PF 1 MG/ML IJ SOLN
1.0000 mg | Freq: Once | INTRAMUSCULAR | Status: AC
  Administered 2012-01-10: 0.5 mg via INTRAVENOUS
  Filled 2012-01-10: qty 1

## 2012-01-10 MED ORDER — ONDANSETRON HCL 4 MG/2ML IJ SOLN
INTRAMUSCULAR | Status: AC
  Administered 2012-01-10: 4 mg
  Filled 2012-01-10: qty 2

## 2012-01-10 MED ORDER — GI COCKTAIL ~~LOC~~
30.0000 mL | Freq: Once | ORAL | Status: AC
  Administered 2012-01-10: 30 mL via ORAL
  Filled 2012-01-10: qty 30

## 2012-01-10 MED ORDER — OXYCODONE-ACETAMINOPHEN 5-325 MG PO TABS: 1.0000 | ORAL_TABLET | Freq: Four times a day (QID) | ORAL | Status: AC | PRN

## 2012-01-10 NOTE — ED Notes (Signed)
MD at bedside. 

## 2012-01-10 NOTE — ED Provider Notes (Signed)
History  Scribed for Gwyneth Sprout, MD, the patient was seen in room MH08/MH08. This chart was scribed by Candelaria Stagers. The patient's care started at 7:17 PM    CSN: [id number]  Arrival date & time 01/10/12  1804   First MD Initiated Contact with Patient 01/10/12 1825      Chief Complaint  Patient presents with  . Chest Pain     The history is provided by the patient.   [name] is a 51 y.o. female who presents to the Emergency Department complaining of constant chest pain that radiates to the back and left arm.  She states that the pain started after having an endoscopy done about three weeks ago.  She has followed up with the MD that performed the endoscopy who could not find the cause for pain.  She has never felt sx like this before.  Nothing seems to make the pain better, coughing and breathing seems to make the pain worse.  She has trouble sleeping due to pain.  She has taken Vicodin with no relief.  She is also experiencing a headache and diaphoresis.  Pt was seen in [facility] earlier this week.       Past Medical History  Diagnosis Date  . Hypertension   . Edema   . Pain in limb   . Back pain   . Asthma   . MVP (mitral valve prolapse) 12/22/2011  . S/P dilatation of esophageal stricture 12/22/2011    Past Surgical History  Procedure Date  . Abdominal hysterectomy   . Cardiac catheterization   . Tonsillectomy     Family History  Problem Relation Age of Onset  . Diabetes Mother   . Hypertension Mother   . Diabetes Father   . Hypertension Father   . Stroke Other   . Coronary artery disease Other     History  Substance Use Topics  . Smoking status: Never Smoker   . Smokeless tobacco: Never Used  . Alcohol Use: No    OB History    Grav Para Term Preterm Abortions TAB SAB Ect Mult Living                  Review of Systems  Constitutional: Positive for diaphoresis.  Cardiovascular: Positive for chest pain.  Gastrointestinal:  Negative for abdominal pain.  Musculoskeletal: Positive for back pain.       Burning pain down left arm  Neurological: Positive for headaches.  All other systems reviewed and are negative.    Allergies  Prednisone and Protonix  Home Medications   Current Outpatient Rx  Name Route Sig Dispense Refill  . ATORVASTATIN CALCIUM 10 MG PO TABS Oral Take 1 tablet (10 mg total) by mouth daily at 6 PM. 30 tablet 0  . ENALAPRIL-HYDROCHLOROTHIAZIDE 5-12.5 MG PO TABS Oral Take 1 tablet by mouth daily.    Marland Kitchen LASIX PO Oral Take 20 mg by mouth as needed.       BP 132/92  Pulse 72  Temp(Src) 97.9 F (36.6 C) (Oral)  Resp 20  SpO2 100%  Physical Exam  Nursing note and vitals reviewed. Constitutional: She is oriented to person, place, and time. She appears well-developed and well-nourished.       Appears uncomfortable  HENT:  Head: Normocephalic and atraumatic.  Eyes: EOM are normal. Right eye exhibits no discharge. Left eye exhibits no discharge.  Neck: Normal range of motion. Neck supple.  Cardiovascular: Intact distal pulses.   Pulmonary/Chest:  Mild sternal tenderness  Abdominal: There is tenderness (mild epigastric tenderness).  Musculoskeletal: Normal range of motion. She exhibits no edema.       Point tenderness to left trapezius with spasms.   Neurological: She is alert and oriented to person, place, and time.  Skin: Skin is warm. She is diaphoretic.  Psychiatric: She has a normal mood and affect. Her behavior is normal.    ED Course  Procedures   DIAGNOSTIC STUDIES: Oxygen Saturation is 100% on room air, normal by my interpretation.    COORDINATION OF CARE:  18:13 Ordered: Draw & Hold Blood RAINBOW ; CBC ; Basic metabolic panel ; Oxygen therapy ; DG Chest 2 View ; aspirin chewable tablet 324 mg ; Cardiac panel(cret kin+cktot+mb+tropi); Cardiac monitoring ; Pulse oximetry, continuous ; ED EKG (<66mins upon arrival to the ED) ; Saline lock IV  Results for orders placed  during the hospital encounter of 01/10/12  CBC      Component Value Range   WBC 7.0  4.0 - 10.5 (K/uL)   RBC 4.06  3.87 - 5.11 (MIL/uL)   Hemoglobin 12.8  12.0 - 15.0 (g/dL)   HCT 40.9  81.1 - 91.4 (%)   MCV 91.1  78.0 - 100.0 (fL)   MCH 31.5  26.0 - 34.0 (pg)   MCHC 34.6  30.0 - 36.0 (g/dL)   RDW 78.2  95.6 - 21.3 (%)   Platelets 208  150 - 400 (K/uL)  BASIC METABOLIC PANEL      Component Value Range   Sodium 140  135 - 145 (mEq/L)   Potassium 3.8  3.5 - 5.1 (mEq/L)   Chloride 103  96 - 112 (mEq/L)   CO2 27  19 - 32 (mEq/L)   Glucose, Bld 102 (*) 70 - 99 (mg/dL)   BUN 11  6 - 23 (mg/dL)   Creatinine, Ser 0.86  0.50 - 1.10 (mg/dL)   Calcium 9.4  8.4 - 57.8 (mg/dL)   GFR calc non Af Amer 85 (*) >90 (mL/min)   GFR calc Af Amer >90  >90 (mL/min)  CARDIAC PANEL(CRET KIN+CKTOT+MB+TROPI)      Component Value Range   Total CK 126  7 - 177 (U/L)   CK, MB 1.7  0.3 - 4.0 (ng/mL)   Troponin I <0.30  <0.30 (ng/mL)   Relative Index 1.3  0.0 - 2.5    Results for orders placed during the hospital encounter of 01/10/12  CBC      Component Value Range   WBC 7.0  4.0 - 10.5 (K/uL)   RBC 4.06  3.87 - 5.11 (MIL/uL)   Hemoglobin 12.8  12.0 - 15.0 (g/dL)   HCT 46.9  62.9 - 52.8 (%)   MCV 91.1  78.0 - 100.0 (fL)   MCH 31.5  26.0 - 34.0 (pg)   MCHC 34.6  30.0 - 36.0 (g/dL)   RDW 41.3  24.4 - 01.0 (%)   Platelets 208  150 - 400 (K/uL)  BASIC METABOLIC PANEL      Component Value Range   Sodium 140  135 - 145 (mEq/L)   Potassium 3.8  3.5 - 5.1 (mEq/L)   Chloride 103  96 - 112 (mEq/L)   CO2 27  19 - 32 (mEq/L)   Glucose, Bld 102 (*) 70 - 99 (mg/dL)   BUN 11  6 - 23 (mg/dL)   Creatinine, Ser 2.72  0.50 - 1.10 (mg/dL)   Calcium 9.4  8.4 - 53.6 (mg/dL)   GFR calc  non Af Amer 85 (*) >90 (mL/min)   GFR calc Af Amer >90  >90 (mL/min)  CARDIAC PANEL(CRET KIN+CKTOT+MB+TROPI)      Component Value Range   Total CK 126  7 - 177 (U/L)   CK, MB 1.7  0.3 - 4.0 (ng/mL)   Troponin I <0.30  <0.30  (ng/mL)   Relative Index 1.3  0.0 - 2.5    Dg Chest 2 View  01/10/2012  *RADIOLOGY REPORT*  Clinical Data: Chest pain  CHEST - 2 VIEW  Comparison: 12/22/2011  Findings: The heart is upper normal in size.  Lungs are clear. Normal pulmonary vascularity.  No pleural effusion.  No pneumothorax.  IMPRESSION: No active cardiopulmonary disease.  Borderline cardiomegaly.  Original Report Authenticated By: Donavan Burnet, M.D.    Ct Angio Chest W/cm &/or Wo Cm  01/10/2012  *RADIOLOGY REPORT*  Clinical Data: Chest pain  CT ANGIOGRAPHY CHEST  Technique:  Multidetector CT imaging of the chest using the standard protocol during bolus administration of intravenous contrast. Multiplanar reconstructed images including MIPs were obtained and reviewed to evaluate the vascular anatomy.  Contrast: 80mL OMNIPAQUE IOHEXOL 350 MG/ML IV SOLN  Comparison: None.  Findings: No filling defects in the pulmonary arterial tree to suggest acute pulmonary thromboembolism.  Negative abnormal mediastinal adenopathy.  No pneumothorax and no pleural effusion  Lungs are clear.  No acute bony deformity.  Tiny cyst in the caudate lobe of the liver.  IMPRESSION: No evidence of acute pulmonary thromboembolism.  Original Report Authenticated By: Donavan Burnet, M.D.        Labs Reviewed  CBC  BASIC METABOLIC PANEL  CARDIAC PANEL(CRET KIN+CKTOT+MB+TROPI)   No results found.   1. Chest pain   2. Trapezius muscle spasm       MDM  Pt with atypical story for CP.  TIMI 0 and recently evaluated in the hospital 2 weeks ago with negative enzymes, followup outpatient stress, evaluation by GI with a recent esophageal stricture dilation. Patient also had an upper GI done on Friday which showed no signs of esophageal spasm or abnormalities. She has had a cardiac catheterization in the past that was also within normal limits.  EKG unchanged.  CXR, CBC, BMP, CE  all within normal limits. However the patient also has significant tenderness over  her left trapezius and concern that she may be having a cervical radiculopathy causing her symptoms however based on her persistent symptoms of chest pain and the recent hospitalization of 6 days will get a CTA of the chest to rule out PE. The CT was negative patient was improved after pain control in muscle relaxers. Do not feel that this is GI in origin and feel less likely musculoskeletal. Symptoms are not suggestive of ACS, dissection or pericarditis. This would information was relayed to the patient and she will follow up with her regular practitioner for possible further evaluation of her neck   I personally performed the services described in this documentation, which was scribed in my presence.  The recorded information has been reviewed and considered.           Gwyneth Sprout, MD 01/11/12 [phone]

## 2012-01-10 NOTE — ED Notes (Signed)
Pt c/o LT CP w/ radiation to LUE x >1 week- was admitted to [facility] for CP (d/c'd last week); sts pain never went away completely & is worsening recently

## 2012-02-04 ENCOUNTER — Other Ambulatory Visit: Payer: Self-pay | Admitting: Cardiology

## 2012-02-04 DIAGNOSIS — I872 Venous insufficiency (chronic) (peripheral): Secondary | ICD-10-CM

## 2012-02-17 ENCOUNTER — Inpatient Hospital Stay: Admission: RE | Admit: 2012-02-17 | Payer: PRIVATE HEALTH INSURANCE | Source: Ambulatory Visit

## 2012-02-17 ENCOUNTER — Other Ambulatory Visit: Payer: PRIVATE HEALTH INSURANCE

## 2012-04-06 ENCOUNTER — Inpatient Hospital Stay: Admission: RE | Admit: 2012-04-06 | Payer: PRIVATE HEALTH INSURANCE | Source: Ambulatory Visit

## 2012-04-06 ENCOUNTER — Other Ambulatory Visit: Payer: PRIVATE HEALTH INSURANCE

## 2012-07-14 ENCOUNTER — Ambulatory Visit (INDEPENDENT_AMBULATORY_CARE_PROVIDER_SITE_OTHER): Payer: BC Managed Care – PPO | Admitting: Family Medicine

## 2012-07-14 VITALS — BP 161/100 | HR 74 | Temp 97.8°F | Resp 16 | Ht 67.0 in | Wt 193.0 lb

## 2012-07-14 DIAGNOSIS — H539 Unspecified visual disturbance: Secondary | ICD-10-CM

## 2012-07-14 DIAGNOSIS — C4330 Malignant melanoma of unspecified part of face: Secondary | ICD-10-CM

## 2012-07-14 DIAGNOSIS — I10 Essential (primary) hypertension: Secondary | ICD-10-CM

## 2012-07-14 DIAGNOSIS — R609 Edema, unspecified: Secondary | ICD-10-CM

## 2012-07-14 MED ORDER — ENALAPRIL-HYDROCHLOROTHIAZIDE 5-12.5 MG PO TABS: 1.0000 | ORAL_TABLET | Freq: Every day | ORAL | Status: DC

## 2012-07-14 MED ORDER — FUROSEMIDE 20 MG PO TABS: 20.0000 mg | ORAL_TABLET | Freq: Every day | ORAL | Status: DC | PRN

## 2012-07-14 NOTE — Progress Notes (Signed)
[facility]:  Office Visit  Chief Complaint:  Chief Complaint  Patient presents with  . Hypertension  . Dizziness  . Blurred Vision    HPI: [name] is a 51 y.o. female who complains of  HTN with dizziness and blurred vision this AM, no slurred speech, no facial assymetry or asmmmetric weakness. Her vision is bad, has glasses but does not wear. She gets HA from squinting. She has not taken any of her rx medications in last 3 months. She is only here today because she took her BP and realized it was high-150-160s/101-105.   Past Medical History  Diagnosis Date  . Hypertension   . Edema   . Pain in limb   . Back pain   . Asthma   . MVP (mitral valve prolapse) 12/22/2011  . S/P dilatation of esophageal stricture 12/22/2011   Past Surgical History  Procedure Date  . Abdominal hysterectomy   . Cardiac catheterization   . Tonsillectomy   . Spine surgery    History   Social History  . Marital Status: Legally Separated    Spouse Name: N/A    Number of Children: 3  . Years of Education: N/A   Occupational History  . office manager    Social History Main Topics  . Smoking status: Never Smoker   . Smokeless tobacco: Never Used  . Alcohol Use: No  . Drug Use: None  . Sexually Active: No   Other Topics Concern  . None   Social History Narrative  . None   Family History  Problem Relation Age of Onset  . Diabetes Mother   . Hypertension Mother   . Diabetes Father   . Hypertension Father   . Stroke Other   . Coronary artery disease Other    Allergies  Allergen Reactions  . Morphine And Related Other (See Comments)    hallucinations  . Pantoprazole Sodium Other (See Comments)    blisters  . Prednisone Other (See Comments)    Blisters on tongue   Prior to Admission medications   Medication Sig Start Date End Date Taking? Authorizing Provider  Enalapril-Hydrochlorothiazide 5-12.5 MG per tablet Take 1 tablet by mouth daily.   Yes  Historical Provider, MD  furosemide (LASIX) 20 MG tablet Take 20 mg by mouth daily as needed. For swelling   Yes Historical Provider, MD  Multiple Vitamin (MULITIVITAMIN WITH MINERALS) TABS Take 1 tablet by mouth daily.   Yes Historical Provider, MD  atorvastatin (LIPITOR) 10 MG tablet Take 1 tablet (10 mg total) by mouth daily at 6 PM. 12/25/11 12/24/12  Gwenyth Bender, NP  esomeprazole (NEXIUM) 40 MG capsule Take 1 capsule (40 mg total) by mouth daily before breakfast. 12/25/11 01/07/12  Gwenyth Bender, NP  HYDROcodone-acetaminophen (NORCO) 5-325 MG per tablet Take 1 tablet by mouth every 6 (six) hours as needed. For pain    Historical Provider, MD  Papaya CHEW Chew 2 tablets by mouth daily.    Historical Provider, MD  PRESCRIPTION MEDICATION Take 1 capsule by mouth daily. Acid reflux medicine    Historical Provider, MD     ROS: The patient denies fevers, chills, night sweats, unintentional weight loss, chest pain, palpitations, wheezing, dyspnea on exertion, nausea, vomiting, abdominal pain, dysuria, hematuria, melena, numbness, weakness, or tingling.   All other systems have been reviewed and were otherwise negative with the exception of those mentioned in the HPI and as above.    PHYSICAL EXAM: Filed Vitals:  07/14/12 1512  BP: 161/100  Pulse: 74  Temp: 97.8 F (36.6 C)  Resp: 16   Filed Vitals:   07/14/12 1512  Height: 5\' 7"  (1.702 m)  Weight: 193 lb (87.544 kg)   Body mass index is 30.23 kg/(m^2).  General: Alert, no acute distress HEENT:  Normocephalic, atraumatic, oropharynx patent. EOMI, PERRAL, gross fundoscpic exam nl. No papilledema Cardiovascular:  Regular rate and rhythm, no rubs murmurs or gallops.  No Carotid bruits, radial pulse intact. No pedal edema.  Respiratory: Clear to auscultation bilaterally.  No wheezes, rales, or rhonchi.  No cyanosis, no use of accessory musculature GI: No organomegaly, abdomen is soft and non-tender, positive bowel sounds.  No masses. Skin:  No rashes. Neurologic: Facial musculature symmetric. Psychiatric: Patient is appropriate throughout our interaction. Lymphatic: No cervical lymphadenopathy Musculoskeletal: Gait intact.   LABS: Results for orders placed during the hospital encounter of 01/10/12  CBC      Component Value Range   WBC 7.0  4.0 - 10.5 K/uL   RBC 4.06  3.87 - 5.11 MIL/uL   Hemoglobin 12.8  12.0 - 15.0 g/dL   HCT 16.1  09.6 - 04.5 %   MCV 91.1  78.0 - 100.0 fL   MCH 31.5  26.0 - 34.0 pg   MCHC 34.6  30.0 - 36.0 g/dL   RDW 40.9  81.1 - 91.4 %   Platelets 208  150 - 400 K/uL  BASIC METABOLIC PANEL      Component Value Range   Sodium 140  135 - 145 mEq/L   Potassium 3.8  3.5 - 5.1 mEq/L   Chloride 103  96 - 112 mEq/L   CO2 27  19 - 32 mEq/L   Glucose, Bld 102 (*) 70 - 99 mg/dL   BUN 11  6 - 23 mg/dL   Creatinine, Ser 7.82  0.50 - 1.10 mg/dL   Calcium 9.4  8.4 - 95.6 mg/dL   GFR calc non Af Amer 85 (*) >90 mL/min   GFR calc Af Amer >90  >90 mL/min  CARDIAC PANEL(CRET KIN+CKTOT+MB+TROPI)      Component Value Range   Total CK 126  7 - 177 U/L   CK, MB 1.7  0.3 - 4.0 ng/mL   Troponin I <0.30  <0.30 ng/mL   Relative Index 1.3  0.0 - 2.5     EKG/XRAY:   Primary read interpreted by Dr. Conley Rolls at [facility].   ASSESSMENT/PLAN: Encounter Diagnoses  Name Primary?  . HTN (hypertension) Yes  . Vision changes    51 y/o very non-compliant AA female with multiple risk factors for CVA/MI who is here today because she has been out of her BP meds for 3 months and when she was on them did not take them as rx- Refill Enalapril-HCTZ  5/12.5 mg daily ( BP today was in 150-160s/101-106), Advise to measure BP at home. BP goal is < 140/90. She has been told her increase risk of CVA/MI due to personal hx and also family hx. She has XOL, HTN, MVP and father who died from MI at age 30 Refill Lasix for intermittent LE edema Return in 1 week with BP logs, f/u with Dr. Audria Nine for labs, lipids, BMP and HTN check Refer back to  optometry for change of lens since she does not like bifocals and they make her dizzy when she takes them off or looks down. She can use it for nearsightedness but again does not want to rely on glasses.  Go  to ER prn CP/SOB   LE, THAO PHUONG, DO 07/14/2012 3:42 PM

## 2012-07-15 NOTE — Patient Instructions (Signed)
Hypertension       Hypertension is another name for high blood pressure. High blood pressure may mean that your heart needs to work harder to pump blood. Blood pressure consists of two numbers, which includes a higher number over a lower number (example: 110/72).     HOME CARE     Make lifestyle changes as told by your doctor. This may include weight loss and exercise.   Take your blood pressure medicine every day.   Limit how much salt you use.   Stop smoking if you smoke.   Do not use drugs.   Talk to your doctor if you are using decongestants or birth control pills. These medicines might make blood pressure higher.   Females should not drink more than 1 alcoholic drink per day. Males should not drink more than 2 alcoholic drinks per day.   See your doctor as told.    GET HELP RIGHT AWAY IF:     You have a blood pressure reading with a top number of 180 or higher.   You get a very bad headache.   You get blurred or changing vision.   You feel confused.   You feel weak, numb, or faint.   You get chest or belly (abdominal) pain.   You throw up (vomit).   You cannot breathe very well.    MAKE SURE YOU:     Understand these instructions.   Will watch your condition.   Will get help right away if you are not doing well or get worse.      Document Released: 04/06/2008 Document Revised: 10/08/2011 Document Reviewed: 04/06/2008   [employer] Patient Information 2012 [employer].

## 2012-07-19 ENCOUNTER — Telehealth: Payer: Self-pay

## 2012-07-19 NOTE — Telephone Encounter (Signed)
Patient advised she does have appt this week for this, Thursday or Friday. Do we need to adjust anything before she comes in? Her blood pressure readings continue to be extremely elevated.

## 2012-07-19 NOTE — Telephone Encounter (Signed)
PT STATES SHE WAS PUT ON SOME BLOOD PRESSURE MEDICINE AND HER PRESSURE IS STILL HIGH AND SHE IS CONCERNED ABOUT IT. PLEASE CALL [phone] HER PRESSURE IS [phone]   [facility] [address]

## 2012-07-19 NOTE — Telephone Encounter (Signed)
She was instructed to follow up this with Dr Audria Nine this week, need to know if she has plans to do this. I have called left message for her to call me back.

## 2012-07-19 NOTE — Telephone Encounter (Signed)
I advise her to follow up as directed. She did not get this way overnight. We cannot fix this overnight. Yes her blood pressure is extremely elevated and without correction something bad will happen. We have to work our way up on the medications. She can take an extra Enalapril/HCTZ 5/12.5 mg for a total of 10/25 mg. Her blood pressure is very likely to still be elevated with this, but it is going to take time to bring this down. Should she develop any worrisome symptoms of chest pain, headache, vision changes, slurred speech, numbness, tingling, unable to move a limb, or other focal deficits she should call 911 immediately. I would like for her to follow up with our office within the next 48 hours.

## 2012-07-20 NOTE — Telephone Encounter (Signed)
Gave pt instr's from [place] and list of Sxs to call 911 if she experiences. Pt agreed. Checked pt's appt time which is tomorrow at 9:00, but again instr'd her to come sooner if any concerns and to call 911 for Sxs discussed.

## 2012-07-21 ENCOUNTER — Ambulatory Visit (INDEPENDENT_AMBULATORY_CARE_PROVIDER_SITE_OTHER): Payer: BC Managed Care – PPO | Admitting: Family Medicine

## 2012-07-21 ENCOUNTER — Encounter: Payer: Self-pay | Admitting: Family Medicine

## 2012-07-21 VITALS — BP 142/100 | HR 66 | Temp 97.8°F | Resp 16 | Ht 66.5 in | Wt 191.2 lb

## 2012-07-21 DIAGNOSIS — G4700 Insomnia, unspecified: Secondary | ICD-10-CM

## 2012-07-21 DIAGNOSIS — I10 Essential (primary) hypertension: Secondary | ICD-10-CM

## 2012-07-21 DIAGNOSIS — J309 Allergic rhinitis, unspecified: Secondary | ICD-10-CM

## 2012-07-21 DIAGNOSIS — E010 Iodine-deficiency related diffuse (endemic) goiter: Secondary | ICD-10-CM

## 2012-07-21 DIAGNOSIS — E049 Nontoxic goiter, unspecified: Secondary | ICD-10-CM

## 2012-07-21 DIAGNOSIS — E559 Vitamin D deficiency, unspecified: Secondary | ICD-10-CM

## 2012-07-21 LAB — POCT URINALYSIS DIPSTICK
Leukocytes, UA: NEGATIVE
Nitrite, UA: NEGATIVE
Protein, UA: NEGATIVE
Spec Grav, UA: 1.015
Urobilinogen, UA: 0.2

## 2012-07-21 LAB — TSH: TSH: 1.023 u[IU]/mL (ref 0.350–4.500)

## 2012-07-21 LAB — T4, FREE: Free T4: 1.06 ng/dL (ref 0.80–1.80)

## 2012-07-21 MED ORDER — ALPRAZOLAM 0.25 MG PO TABS: 0.2500 mg | ORAL_TABLET | Freq: Every evening | ORAL | Status: DC | PRN

## 2012-07-21 MED ORDER — MOMETASONE FUROATE 50 MCG/ACT NA SUSP: NASAL | Status: DC

## 2012-07-21 MED ORDER — ENALAPRIL-HYDROCHLOROTHIAZIDE 10-25 MG PO TABS: 1.0000 | ORAL_TABLET | Freq: Every day | ORAL | Status: DC

## 2012-07-21 NOTE — Progress Notes (Signed)
Subjective:    Patient ID: [name], female    DOB: 1961-08-13, 51 y.o.   MRN: [id number]  HPI   This 51 y.o. AA female is here for HTN follow-up and other problems.. She is very stressed  due to graduate school  work and current job in Print production planner. She c/o problems with sleep  and fatigue (possible RLS?), problems with memory and lack of concentration and twitching on  left side of face. She talks fast and this has been pointed out to her by friends. She has hx of  low Vit D which is a puzzle to her because  she used to work in the sun 10+ hours per day.   Recent hospitalization for evaluation of atypical chest pain (MI ruled out). Very concerned  about this because of family hx of HTN and DM.    Last PAP: ~ 5 years ago (normal)  S/P TAH 13 years ago.   Review of Systems  Constitutional: Positive for diaphoresis.  HENT: Positive for neck stiffness, sinus pressure, tinnitus and ear discharge.   Eyes: Positive for visual disturbance. Negative for photophobia.  Respiratory: Positive for wheezing.   Cardiovascular: Positive for leg swelling.  Gastrointestinal: Positive for nausea and constipation.       Occasionally  Genitourinary: Negative.   Musculoskeletal: Positive for myalgias and gait problem.       At times I find myself falling over  Skin: Negative.   Neurological: Positive for dizziness, speech difficulty and headaches.       Sometimes  Hematological: Bruises/bleeds easily.  Psychiatric/Behavioral: Positive for confusion and decreased concentration.       Objective:   Physical Exam  Nursing note and vitals reviewed. Constitutional: She is oriented to person, place, and time. She appears well-developed and well-nourished. No distress.  HENT:  Head: Normocephalic and atraumatic.  Right Ear: Hearing, external ear and ear canal normal. Tympanic membrane is not injected and not retracted. A middle ear effusion is present.  Left Ear: Hearing, external ear and ear  canal normal. Tympanic membrane is not injected and not retracted. A middle ear effusion is present.  Nose: Nose normal. No mucosal edema, rhinorrhea, nasal deformity or septal deviation. Right sinus exhibits no maxillary sinus tenderness and no frontal sinus tenderness. Left sinus exhibits no maxillary sinus tenderness and no frontal sinus tenderness.  Mouth/Throat: Uvula is midline and mucous membranes are normal. No oral lesions. Normal dentition. No dental caries. Posterior oropharyngeal erythema present. No oropharyngeal exudate or posterior oropharyngeal edema.  Eyes: Conjunctivae normal and EOM are normal. Pupils are equal, round, and reactive to light. No scleral icterus.  Neck: Normal range of motion. Neck supple. Thyromegaly present.  Cardiovascular: Normal rate, regular rhythm and normal heart sounds.  Exam reveals no gallop and no friction rub.   No murmur heard. Pulmonary/Chest: Effort normal and breath sounds normal. No respiratory distress. She has no wheezes.  Abdominal: Soft. Normal appearance. She exhibits no distension, no pulsatile midline mass and no mass. Bowel sounds are decreased. There is no hepatosplenomegaly. There is no tenderness. There is no guarding and no CVA tenderness.  Musculoskeletal: Normal range of motion. She exhibits no edema and no tenderness.  Lymphadenopathy:    She has no cervical adenopathy.  Neurological: She is alert and oriented to person, place, and time. She has normal reflexes. No cranial nerve deficit. She exhibits normal muscle tone. Coordination normal.  Skin: Skin is warm and dry. No rash noted. No erythema.  Psychiatric: Judgment  and thought content normal. Her mood appears anxious. Her affect is not blunt, not labile and not inappropriate. Her speech is rapid and/or pressured. Her speech is not tangential. She is is hyperactive. Cognition and memory are normal. She does not exhibit a depressed mood. She is attentive.          Assessment &  Plan:   1. HTN (hypertension) -inadequate control Comprehensive metabolic panel, POCT urinalysis dipstick RX: Enalapril- HCTZ 10-25 mg  1 tab daily  2. Unspecified vitamin D deficiency  Vitamin D, 25-hydroxy  3. Thyromegaly  TSH, T4, Free  4. Insomnia - pt has difficulty quieting mind at bedtime; often studies right up to "lights out" Discussed importance of normal sleep (7-8 hours/night); advised pt to allow 30 minutes for quiet time prior to trying to sleep. RX: Alprazolam 0.25 mg 1 tab hs for sleep  5. Allergic rhinitis, cause unspecified  RX: Nasonex  1 spr EN hs (pt has hx of oral predisone intolerance)

## 2012-07-21 NOTE — Patient Instructions (Signed)
Allergic Rhinitis Allergic rhinitis is when the mucous membranes in the nose respond to allergens. Allergens are particles in the air that cause your body to have an allergic reaction. This causes you to release allergic antibodies. Through a chain of events, these eventually cause you to release histamine into the blood stream (hence the use of antihistamines). Although meant to be protective to the body, it is this release that causes your discomfort, such as frequent sneezing, congestion and an itchy runny nose.  CAUSES  The pollen allergens may come from grasses, trees, and weeds. This is seasonal allergic rhinitis, or "hay fever." Other allergens cause year-round allergic rhinitis (perennial allergic rhinitis) such as house dust mite allergen, pet dander and mold spores.  SYMPTOMS   Nasal stuffiness (congestion).   Runny, itchy nose with sneezing and tearing of the eyes.   There is often an itching of the mouth, eyes and ears.  It cannot be cured, but it can be controlled with medications. DIAGNOSIS  If you are unable to determine the offending allergen, skin or blood testing may find it. TREATMENT   Avoid the allergen.   Medications and allergy shots (immunotherapy) can help.   Hay fever may often be treated with antihistamines in pill or nasal spray forms. Antihistamines block the effects of histamine. There are over-the-counter medicines that may help with nasal congestion and swelling around the eyes. Check with your caregiver before taking or giving this medicine.  If the treatment above does not work, there are many new medications your caregiver can prescribe. Stronger medications may be used if initial measures are ineffective. Desensitizing injections can be used if medications and avoidance fails. Desensitization is when a patient is given ongoing shots until the body becomes less sensitive to the allergen. Make sure you follow up with your caregiver if problems continue. SEEK  MEDICAL CARE IF:   You develop fever (more than 100.5 F (38.1 C).   You develop a cough that does not stop easily (persistent).   You have shortness of breath.   You start wheezing.   Symptoms interfere with normal daily activities.  Document Released: 07/14/2001 Document Revised: 10/08/2011 Document Reviewed: 01/23/2009 [facility] Patient Information 2012 [place], [place].    Insomnia Insomnia is frequent trouble falling and/or staying asleep. Insomnia can be a long term problem or a short term problem. Both are common. Insomnia can be a short term problem when the wakefulness is related to a certain stress or worry. Long term insomnia is often related to ongoing stress during waking hours and/or poor sleeping habits. Overtime, sleep deprivation itself can make the problem worse. Every little thing feels more severe because you are overtired and your ability to cope is decreased. CAUSES   Stress, anxiety, and depression.   Poor sleeping habits.   Distractions such as TV in the bedroom.   Naps close to bedtime.   Engaging in emotionally charged conversations before bed.   Technical reading before sleep.   Alcohol and other sedatives. They may make the problem worse. They can hurt normal sleep patterns and normal dream activity.   Stimulants such as caffeine for several hours prior to bedtime.   Pain syndromes and shortness of breath can cause insomnia.   Exercise late at night.   Changing time zones may cause sleeping problems (jet lag).  It is sometimes helpful to have someone observe your sleeping patterns. They should look for periods of not breathing during the night (sleep apnea). They should also look to  see how long those periods last. If you live alone or observers are uncertain, you can also be observed at a sleep clinic where your sleep patterns will be professionally monitored. Sleep apnea requires a checkup and treatment. Give your caregivers your medical history.  Give your caregivers observations your family has made about your sleep.  SYMPTOMS   Not feeling rested in the morning.   Anxiety and restlessness at bedtime.   Difficulty falling and staying asleep.  TREATMENT   Your caregiver may prescribe treatment for an underlying medical disorders. Your caregiver can give advice or help if you are using alcohol or other drugs for self-medication. Treatment of underlying problems will usually eliminate insomnia problems.   Medications can be prescribed for short time use. They are generally not recommended for lengthy use.   Over-the-counter sleep medicines are not recommended for lengthy use. They can be habit forming.   You can promote easier sleeping by making lifestyle changes such as:   Using relaxation techniques that help with breathing and reduce muscle tension.   Exercising earlier in the day.   Changing your diet and the time of your last meal. No night time snacks.   Establish a regular time to go to bed.   Counseling can help with stressful problems and worry.   Soothing music and white noise may be helpful if there are background noises you cannot remove.   Stop tedious detailed work at least one hour before bedtime.  HOME CARE INSTRUCTIONS   Keep a diary. Inform your caregiver about your progress. This includes any medication side effects. See your caregiver regularly. Take note of:   Times when you are asleep.   Times when you are awake during the night.   The quality of your sleep.   How you feel the next day.  This information will help your caregiver care for you.  Get out of bed if you are still awake after 15 minutes. Read or do some quiet activity. Keep the lights down. Wait until you feel sleepy and go back to bed.   Keep regular sleeping and waking hours. Avoid naps.   Exercise regularly.   Avoid distractions at bedtime. Distractions include watching television or engaging in any intense or detailed  activity like attempting to balance the household checkbook.   Develop a bedtime ritual. Keep a familiar routine of bathing, brushing your teeth, climbing into bed at the same time each night, listening to soothing music. Routines increase the success of falling to sleep faster.   Use relaxation techniques. This can be using breathing and muscle tension release routines. It can also include visualizing peaceful scenes. You can also help control troubling or intruding thoughts by keeping your mind occupied with boring or repetitive thoughts like the old concept of counting sheep. You can make it more creative like imagining planting one beautiful flower after another in your backyard garden.   During your day, work to eliminate stress. When this is not possible use some of the previous suggestions to help reduce the anxiety that accompanies stressful situations.  MAKE SURE YOU:   Understand these instructions.   Will watch your condition.   Will get help right away if you are not doing well or get worse.  Document Released: 10/16/2000 Document Revised: 10/08/2011 Document Reviewed: 11/16/2007 [facility] Patient Information 2012 [place], [place].

## 2012-07-22 LAB — COMPREHENSIVE METABOLIC PANEL
Alkaline Phosphatase: 63 U/L (ref 39–117)
Chloride: 103 mEq/L (ref 96–112)
Potassium: 4.1 mEq/L (ref 3.5–5.3)
Sodium: 139 mEq/L (ref 135–145)
Total Bilirubin: 0.4 mg/dL (ref 0.3–1.2)

## 2012-07-22 LAB — VITAMIN D 25 HYDROXY (VIT D DEFICIENCY, FRACTURES): Vit D, 25-Hydroxy: 34 ng/mL (ref 30–89)

## 2012-07-22 NOTE — Progress Notes (Deleted)
  Subjective:    Patient ID: [name], female    DOB: [birth year], 51 y.o.   MRN: [id number]  HPI    Review of Systems     Objective:   Physical Exam        Assessment & Plan:

## 2012-07-22 NOTE — Progress Notes (Signed)
Quick Note:  Please call pt and advise that labs are normal but Vitamin D level is a in the low normal range; get OTC Vitamin D3 2000 IU per capsule and take 1 capsule daily to get level up to ~50.  Chemistries are normal. Thyroid tests are normal.   Copy to pt.  ______

## 2012-07-23 ENCOUNTER — Encounter: Payer: Self-pay | Admitting: Radiology

## 2012-09-01 ENCOUNTER — Encounter: Payer: BC Managed Care – PPO | Admitting: Family Medicine

## 2012-09-01 NOTE — Progress Notes (Signed)
This encounter was created in error - please disregard.

## 2012-11-17 ENCOUNTER — Other Ambulatory Visit: Payer: Self-pay | Admitting: Family Medicine

## 2012-11-24 ENCOUNTER — Ambulatory Visit (INDEPENDENT_AMBULATORY_CARE_PROVIDER_SITE_OTHER): Payer: BC Managed Care – PPO | Admitting: Family Medicine

## 2012-11-24 VITALS — BP 152/89 | HR 59 | Temp 98.0°F | Resp 16 | Ht 67.0 in | Wt 187.8 lb

## 2012-11-24 DIAGNOSIS — J309 Allergic rhinitis, unspecified: Secondary | ICD-10-CM

## 2012-11-24 DIAGNOSIS — N951 Menopausal and female climacteric states: Secondary | ICD-10-CM

## 2012-11-24 DIAGNOSIS — I10 Essential (primary) hypertension: Secondary | ICD-10-CM

## 2012-11-24 DIAGNOSIS — Z8639 Personal history of other endocrine, nutritional and metabolic disease: Secondary | ICD-10-CM

## 2012-11-24 DIAGNOSIS — R140 Abdominal distension (gaseous): Secondary | ICD-10-CM

## 2012-11-24 DIAGNOSIS — E049 Nontoxic goiter, unspecified: Secondary | ICD-10-CM

## 2012-11-24 DIAGNOSIS — R142 Eructation: Secondary | ICD-10-CM

## 2012-11-24 LAB — BASIC METABOLIC PANEL
BUN: 13 mg/dL (ref 6–23)
CO2: 28 mEq/L (ref 19–32)
Chloride: 103 mEq/L (ref 96–112)
Glucose, Bld: 88 mg/dL (ref 70–99)
Potassium: 3.6 mEq/L (ref 3.5–5.3)
Sodium: 139 mEq/L (ref 135–145)

## 2012-11-24 LAB — POCT URINALYSIS DIPSTICK
Bilirubin, UA: NEGATIVE
Blood, UA: NEGATIVE
Ketones, UA: NEGATIVE
Leukocytes, UA: NEGATIVE
Protein, UA: NEGATIVE
pH, UA: 7

## 2012-11-24 MED ORDER — BETAMETHASONE VALERATE 0.1 % EX LOTN: TOPICAL_LOTION | CUTANEOUS | Status: DC

## 2012-11-24 NOTE — Patient Instructions (Addendum)
Menopause symptoms- there are herbal supplements that may help reduce these symptoms; if you want to try Flax Seed oil capsule 1200 mg 1 capsule daily. Also [employer] carries products that are a combination of herbs (Black Cohosh, Evening Primrose oil, Valerian root and Chaste Tree [place] ) that, when combined, help reduce symptoms.  Hypertension- you can continue to take 1/2 tablet for blood pressure reduction.  Allergies and itching ears- use your nasal spray every night. You can get an over-the-counter antihistamine (cetirizine 10 mg) to be taken every night; it may make you a little sleepy.  Abdominal bloating- try to improve nutrition to increase fiber (fruits and vegetables and whole grains) so that you have normal bowel movements.  If this does not improve, a pelvic ultrasound to look at your ovaries may be needed.

## 2012-11-25 LAB — VITAMIN D 25 HYDROXY (VIT D DEFICIENCY, FRACTURES): Vit D, 25-Hydroxy: 40 ng/mL (ref 30–89)

## 2012-11-25 NOTE — Progress Notes (Signed)
Quick Note:  Please contact pt and advise that her recent labs are normal; hormones levels are consistent w/ early menopause.  Vitamin D level is low normal range; OTC Vitamin D 1000 IU taken daily will help increase this; also need to get daily sun exposure on most days of the week and increase Vitamin D containing foods. Try to eat more fruits and vegetables to keep potassium in normal range.  Copy to pt.  ______

## 2012-11-26 ENCOUNTER — Telehealth: Payer: Self-pay

## 2012-11-26 NOTE — Telephone Encounter (Signed)
Patient is calling regarding her lab results.  Patient states that she missed our phone call this morning.   Pt states OK to Leave a Message with the results.   Best#: [phone]

## 2012-11-27 ENCOUNTER — Encounter: Payer: Self-pay | Admitting: Family Medicine

## 2012-11-27 DIAGNOSIS — J309 Allergic rhinitis, unspecified: Secondary | ICD-10-CM | POA: Insufficient documentation

## 2012-11-27 DIAGNOSIS — N951 Menopausal and female climacteric states: Secondary | ICD-10-CM | POA: Insufficient documentation

## 2012-11-27 NOTE — Progress Notes (Signed)
Subjective:    Patient ID: [name], female    DOB: November 24, 1960, 52 y.o.   MRN: [id number]  HPI  This 52 y.o. AA female presents with c/o severe hot flashes- "dripping sweat"- which are  unbearable and embarrassing. They have been increasing in frequency and intensity over last few  months. She attributes this to medications and/ or stress of internship.  She is hesitant to try OTC  supplements or herbal products that have been recommended. 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 She reports  other symptoms including irritability, abd bloating and vision changes.   HTN- pt is compliant w/ medications; monitors BP and readings 125-140/70-90. Pt reports no  medication side effects.   Pt reports problems w/ allergies (sore throat and sinus congestion) but she takes OTC allergy   medication prn. She has ear canal itching and scaliness.   Review of Systems  Constitutional: Positive for diaphoresis and fatigue. Negative for fever and appetite change.  HENT: Positive for congestion, sore throat, postnasal drip and sinus pressure. Negative for ear pain.   Eyes: Positive for visual disturbance. Negative for photophobia, pain and redness.  Respiratory: Negative for cough, chest tightness and shortness of breath.   Cardiovascular: Positive for palpitations. Negative for chest pain and leg swelling.  Gastrointestinal: Positive for constipation and abdominal distention. Negative for nausea, vomiting and abdominal pain.  Genitourinary: Negative for vaginal discharge, vaginal pain, menstrual problem, pelvic pain and dyspareunia.  Musculoskeletal: Negative.   Neurological: Positive for dizziness, light-headedness and headaches. Negative for syncope, weakness and numbness.  Psychiatric/Behavioral: Positive for sleep disturbance and decreased concentration. Negative for behavioral problems, dysphoric mood and agitation. The patient  is nervous/anxious.        Pt takes Alprazolam hs which has helped improve sleep hygiene.       Objective:   Physical Exam  Nursing note and vitals reviewed. Constitutional: She is oriented to person, place, and time. She appears well-developed and well-nourished. No distress.  HENT:  Head: Normocephalic and atraumatic.  Right Ear: Hearing, tympanic membrane and external ear normal.  Left Ear: Hearing, tympanic membrane and external ear normal.  Nose: Mucosal edema present. No rhinorrhea, nasal deformity or septal deviation. Right sinus exhibits no maxillary sinus tenderness and no frontal sinus tenderness. Left sinus exhibits no maxillary sinus tenderness and no frontal sinus tenderness.  Mouth/Throat: Uvula is midline and mucous membranes are normal. Posterior oropharyngeal erythema present. No oropharyngeal exudate.       EACs- skin is dry and scaly.  Eyes: Conjunctivae normal and EOM are normal. Pupils are equal, round, and reactive to light. No scleral icterus.  Neck: Normal range of motion. Neck supple. Thyromegaly present.  Cardiovascular: Normal rate, regular rhythm, normal heart sounds and intact distal pulses.  Exam reveals no gallop and no friction rub.   No murmur heard. Pulmonary/Chest: Effort normal and breath sounds normal. No respiratory distress.  Musculoskeletal: Normal range of motion.  Lymphadenopathy:    She has no cervical adenopathy.  Neurological: She is alert and oriented to person, place, and time. No cranial nerve deficit. Coordination normal.  Skin: Skin is warm and dry.  Psychiatric: She has a normal mood and affect. Her behavior is normal. Judgment and thought content normal.        Assessment & Plan:   1. Menopause syndrome  FSH/LH, FSH/LH, Follicle stimulating hormone, Luteinizing hormone Recommended some herbal remedies that could help  reduce symptoms.  2. HTN (hypertension)  Basic metabolic panel  3. Allergic rhinitis - otitis of ext EACs Take  antihistamine daily. RX: Betamethasone valerate lotion 0.1%-see below  4. Abdominal bloating  POCT urinalysis dipstick Improve diet - increase fiber and water intake.  5. Enlarged thyroid  TSH, T3, Free  6. H/O vitamin D deficiency = 34 ng/mL in Sept Vitamin D, 25-hydroxy; advised OTC Vit D 2000 IU      Meds ordered this encounter  Medications  . betamethasone valerate lotion (VALISONE) 0.1 %    Sig: Apply to q-tip and swab twice a day until symptoms controlled then prn.    Dispense:  60 mL    Refill:  1

## 2012-12-19 ENCOUNTER — Encounter (HOSPITAL_COMMUNITY): Payer: Self-pay | Admitting: *Deleted

## 2012-12-19 ENCOUNTER — Emergency Department (HOSPITAL_COMMUNITY): Payer: BC Managed Care – PPO

## 2012-12-19 ENCOUNTER — Emergency Department (HOSPITAL_COMMUNITY)
Admission: EM | Admit: 2012-12-19 | Discharge: 2012-12-20 | Disposition: A | Discharge status: Home / Self Care | Payer: BC Managed Care – PPO | Attending: Emergency Medicine | Admitting: Emergency Medicine

## 2012-12-19 DIAGNOSIS — J45909 Unspecified asthma, uncomplicated: Secondary | ICD-10-CM | POA: Insufficient documentation

## 2012-12-19 DIAGNOSIS — F411 Generalized anxiety disorder: Secondary | ICD-10-CM | POA: Insufficient documentation

## 2012-12-19 DIAGNOSIS — I10 Essential (primary) hypertension: Secondary | ICD-10-CM | POA: Insufficient documentation

## 2012-12-19 DIAGNOSIS — R0789 Other chest pain: Secondary | ICD-10-CM

## 2012-12-19 DIAGNOSIS — R071 Chest pain on breathing: Secondary | ICD-10-CM | POA: Insufficient documentation

## 2012-12-19 DIAGNOSIS — Z8719 Personal history of other diseases of the digestive system: Secondary | ICD-10-CM | POA: Insufficient documentation

## 2012-12-19 DIAGNOSIS — Z79899 Other long term (current) drug therapy: Secondary | ICD-10-CM | POA: Insufficient documentation

## 2012-12-19 DIAGNOSIS — F329 Major depressive disorder, single episode, unspecified: Secondary | ICD-10-CM

## 2012-12-19 DIAGNOSIS — Z8679 Personal history of other diseases of the circulatory system: Secondary | ICD-10-CM | POA: Insufficient documentation

## 2012-12-19 DIAGNOSIS — F419 Anxiety disorder, unspecified: Secondary | ICD-10-CM

## 2012-12-19 DIAGNOSIS — F3289 Other specified depressive episodes: Secondary | ICD-10-CM | POA: Insufficient documentation

## 2012-12-19 DIAGNOSIS — R609 Edema, unspecified: Secondary | ICD-10-CM | POA: Insufficient documentation

## 2012-12-19 LAB — COMPREHENSIVE METABOLIC PANEL
AST: 26 U/L (ref 0–37)
CO2: 26 mEq/L (ref 19–32)
Calcium: 9.2 mg/dL (ref 8.4–10.5)
Creatinine, Ser: 0.69 mg/dL (ref 0.50–1.10)
GFR calc Af Amer: >90 mL/min (ref >90)
GFR calc non Af Amer: >90 mL/min (ref >90)

## 2012-12-19 LAB — CBC WITH DIFFERENTIAL/PLATELET
Basophils Absolute: 0 10*3/uL (ref 0.0–0.1)
Eosinophils Relative: 3 % (ref 0–5)
HCT: 38.6 % (ref 36.0–46.0)
Lymphocytes Relative: 34 % (ref 12–46)
MCH: 30.7 pg (ref 26.0–34.0)
MCHC: 33.4 g/dL (ref 30.0–36.0)
MCV: 91.9 fL (ref 78.0–100.0)
Monocytes Absolute: 0.6 10*3/uL (ref 0.1–1.0)
RDW: 12.6 % (ref 11.5–15.5)
WBC: 7.7 10*3/uL (ref 4.0–10.5)

## 2012-12-19 LAB — POCT I-STAT TROPONIN I

## 2012-12-19 NOTE — ED Notes (Signed)
Pt in c/o chest pain over last day or two, pt states she has been very overwhelmed lately and has had multiple anxiety attacks and isn't sure if that's what this pain is from, pt also with cardiac history, tearful in traige, pain is worse with inspiration or movement, pain has been constant, pain to left chest wall.

## 2012-12-20 ENCOUNTER — Ambulatory Visit: Payer: BC Managed Care – PPO

## 2012-12-20 MED ORDER — LORAZEPAM 1 MG PO TABS: 1.0000 mg | ORAL_TABLET | Freq: Three times a day (TID) | ORAL | Status: DC | PRN

## 2012-12-20 NOTE — ED Notes (Addendum)
Patient states she has had chest pain/discomfort for 24-48 hours. Patient has a history of chest pain with no cardiac cause. Patient reports increased anxiety and panic attack over the past couple of days. Patient states "I feel like I am about to just drop, just fall into the floor, just overwhelmed". Patient reports a history of taking both Paxil and Zoloft, but not taking anything now, patient takes Xanax at home but is out. Patient states she is not suicial "I know what that means if I say yes" but just feels very overwhelmed, states she would like to just fall asleep and not wake up. Patient is tearful during conversation.

## 2012-12-20 NOTE — ED Provider Notes (Signed)
History     CSN: [id number]  Arrival date & time 12/19/12  2241   First MD Initiated Contact with Patient 12/20/12 0022      Chief Complaint  Patient presents with  . Chest Pain    (Consider location/radiation/quality/duration/timing/severity/associated sxs/prior treatment) HPI 52 year old female presents to emergency department with complaint of left upper chest pain ongoing constantly for the last 2 days. Pain is worse with movement of her arm palpation of her chest, or deep breaths. Patient reports she is under a significant amount of stress currently. She reports all she wants to do is sleep all the time. She is currently in a graduate program for counseling. She feels the program is not helping her. She spoke with a counselor at her college today, and is planning to go back to see them again. She denies suicidal thoughts. She is afraid she is going to be a failure if she drops of the program which is plan on graduating in December. She has had a history of anxiety and depression in the past, and had been on SSRIs without improvement. She reports she was placed on Xanax by her primary care doctor to help with sleep, but she's run out of this. Patient has history of chest pains in the past, she has had a negative heart catheterization and Cardiolite done in the past. She denies any diaphoresis nausea or other complaints. Past Medical History  Diagnosis Date  . Hypertension   . Edema   . Pain in limb   . Back pain   . Asthma   . MVP (mitral valve prolapse) 12/22/2011  . S/P dilatation of esophageal stricture 12/22/2011    Past Surgical History  Procedure Laterality Date  . Abdominal hysterectomy    . Cardiac catheterization    . Tonsillectomy    . Spine surgery      Family History  Problem Relation Age of Onset  . Diabetes Mother   . Hypertension Mother   . Diabetes Father   . Hypertension Father   . Stroke Other   . Coronary artery disease Other     History  Substance  Use Topics  . Smoking status: Never Smoker   . Smokeless tobacco: Never Used  . Alcohol Use: No    OB History   Grav Para Term Preterm Abortions TAB SAB Ect Mult Living                  Review of Systems  All other systems reviewed and are negative.    Allergies  Morphine and related; Pantoprazole sodium; and Prednisone  Home Medications   Current Outpatient Rx  Name  Route  Sig  Dispense  Refill  . betamethasone valerate lotion (VALISONE) 0.1 %      Apply to q-tip and swab twice a day until symptoms controlled then prn.   60 mL   1   . Diethylpropion HCl (TENUATE PO)   Oral   Take by mouth as needed (appetite suppressant.).          Marland Kitchen enalapril-hydrochlorothiazide (VASERETIC) 10-25 MG per tablet   Oral   Take 1 tablet by mouth daily.   30 tablet   5   . fish oil-omega-3 fatty acids 1000 MG capsule   Oral   Take 1 g by mouth daily.         . furosemide (LASIX) 20 MG tablet   Oral   Take 1 tablet (20 mg total) by mouth daily as needed. For  swelling   30 tablet   2   . HYDROcodone-acetaminophen (NORCO) 5-325 MG per tablet   Oral   Take 1 tablet by mouth every 6 (six) hours as needed. For pain         . mometasone (NASONEX) 50 MCG/ACT nasal spray      1 spray in each nostril at bedtime.   17 g   5   . Multiple Vitamin (MULITIVITAMIN WITH MINERALS) TABS   Oral   Take 1 tablet by mouth daily.         . NON FORMULARY      Herbal life shakes         . Nutritional Supp - Diet Aids (DIETERS SUPER TEA PO)   Oral   Take by mouth as needed (to aid in digestion.).         Marland Kitchen ALPRAZolam (XANAX) 0.25 MG tablet   Oral   Take 1 tablet (0.25 mg total) by mouth at bedtime as needed for sleep.   30 tablet   0   . EXPIRED: esomeprazole (NEXIUM) 40 MG capsule   Oral   Take 1 capsule (40 mg total) by mouth daily before breakfast.   13 capsule   0   . LORazepam (ATIVAN) 1 MG tablet   Oral   Take 1 tablet (1 mg total) by mouth 3 (three) times  daily as needed for anxiety.   15 tablet   0     BP 151/97  Pulse 55  Temp(Src) 97.6 F (36.4 C) (Oral)  Resp 19  SpO2 100%  Physical Exam  Nursing note and vitals reviewed. Constitutional: She is oriented to person, place, and time. She appears well-developed and well-nourished.  HENT:  Head: Normocephalic and atraumatic.  Nose: Nose normal.  Mouth/Throat: Oropharynx is clear and moist.  Eyes: Conjunctivae and EOM are normal. Pupils are equal, round, and reactive to light.  Neck: Normal range of motion. Neck supple. No JVD present. No tracheal deviation present. No thyromegaly present.  Cardiovascular: Normal rate, regular rhythm, normal heart sounds and intact distal pulses.  Exam reveals no gallop and no friction rub.   No murmur heard. Pulmonary/Chest: Effort normal and breath sounds normal. No stridor. No respiratory distress. She has no wheezes. She has no rales (ttp over left chest, reproducing th epain). She exhibits tenderness.  Abdominal: Soft. Bowel sounds are normal. She exhibits no distension and no mass. There is no tenderness. There is no rebound and no guarding.  Musculoskeletal: Normal range of motion. She exhibits no edema and no tenderness.  Lymphadenopathy:    She has no cervical adenopathy.  Neurological: She is alert and oriented to person, place, and time. She exhibits normal muscle tone. Coordination normal.  Skin: Skin is warm and dry. No rash noted. No erythema. No pallor.  Psychiatric: Her behavior is normal. Judgment and thought content normal.  Sad, flat, depressed, anxious    ED Course  Procedures (including critical care time)  Labs Reviewed  COMPREHENSIVE METABOLIC PANEL - Abnormal; Notable for the following:    Sodium 133 (*)    Total Bilirubin 0.2 (*)    All other components within normal limits  CBC WITH DIFFERENTIAL  POCT I-STAT TROPONIN I   Dg Chest 2 View  12/20/2012  *RADIOLOGY REPORT*  Clinical Data: Chest pain  CHEST - 2 VIEW   Comparison: Prior chest x-ray and CT scan of the chest 01/10/2012  Findings: The lungs are well-aerated and free from pulmonary edema, focal  airspace consolidation or pulmonary nodule.  Cardiac and mediastinal contours are within normal limits.  No pneumothorax, or pleural effusion. No acute osseous findings.  IMPRESSION:  No acute cardiopulmonary disease.   Original Report Authenticated By: Malachy Moan, M.D.     Date: 12/20/2012  Rate: 62  Rhythm: normal sinus rhythm  QRS Axis: normal  Intervals: normal  ST/T Wave abnormalities: normal  Conduction Disutrbances:none  Narrative Interpretation: LVH  Old EKG Reviewed: unchanged    1. Depression   2. Anxiety   3. Chest wall pain       MDM  52 year old female with significant life stressors and underlying depression with constant chest pain for 2 days that is reproducible with palpation of her chest. I do not feel the pain is cardiac in nature. I do not feel this is ACS. Troponin is negative, EKG unchanged from before. Patient counseled to followup with a local therapist and strongly recommended starting a long-acting medication for depression and anxiety.        Olivia Mackie, MD 12/20/12 [phone]

## 2012-12-27 ENCOUNTER — Ambulatory Visit (INDEPENDENT_AMBULATORY_CARE_PROVIDER_SITE_OTHER): Payer: BC Managed Care – PPO | Admitting: Family Medicine

## 2012-12-27 ENCOUNTER — Encounter: Payer: Self-pay | Admitting: Family Medicine

## 2012-12-27 VITALS — BP 117/78 | HR 69 | Temp 97.9°F | Resp 18 | Ht 67.5 in | Wt 190.0 lb

## 2012-12-27 DIAGNOSIS — G3184 Mild cognitive impairment, so stated: Secondary | ICD-10-CM

## 2012-12-27 DIAGNOSIS — F411 Generalized anxiety disorder: Secondary | ICD-10-CM | POA: Insufficient documentation

## 2012-12-27 MED ORDER — ALPRAZOLAM 0.5 MG PO TABS: ORAL_TABLET | ORAL | Status: DC

## 2012-12-27 MED ORDER — ALPRAZOLAM 0.5 MG PO TBDP: ORAL_TABLET | ORAL | Status: DC

## 2012-12-27 NOTE — Patient Instructions (Addendum)
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 Continue with counseling and readjustment of academics and personal issues such that you can Began to experience improved mental , emotional and physical health. I endorse scaling back your academic demands for now. I will reassess your status in 6-8 weeks; other medication intervention may be needed.  Contact the office sooner if you are not feeling any better.

## 2012-12-27 NOTE — Progress Notes (Signed)
S:  This 52 y.o. AA female is here for f/u re: menopausal symptoms; she found an OTC herbal product that works very well. Night sweats and hot flashes have decreased.   Her major problem is ongoing anxiety; she was evaluated in [facility] for anxiety attack (12/19/12). Lorazepam was prescribed but pt prefers to continue using Alprazolam prn sleep (but requests a higher dose). Past hx of anxiety and depression- pt did not tolerate SSRIs well. She has been under tremendous pressure w/ work and school. Financial instability is a concern because the funding for her position may be cut; she is self-supporting. Her plan to graduate from [employer] program by Dec 2014 was altered; anxiety coupled w/ concentration and memory difficulties hampered school performance. She has reduced her hours and is in counseling. She reveals that she has unresolved issues relating to childhood and, as a result, places extreme pressure on self to succeed.  ROS: Cognitive problems w/ attention deficit, anxiety, sleep disturbance. Today she displays dysphoric mood. She has no thoughts of self harm or harm of others.  O:  Filed Vitals:   12/27/12 1615  BP: 117/78  Pulse: 69  Temp: 97.9 F (36.6 C)  Resp: 18   GEN: In NAD; WN<WD. Well groomed. HENT: /AT; EOMI w/ injected conj and sclerae are clear. EACs/ TMs normal. Post ph erythematous w/ cobblestoning. Nasal mucosa red and boggy. COR: RRR. LUNGS: Normal resp rate and effort. SKIN: W&D; no rashes or erythema. NEURO: A&O x 3; CNs intact. Speech is somewhat pressured but pt is coherent with normal thoughts and judgement. She is tearful and anxious.  A/P: Generalized anxiety disorder- continue counseling; continue Alprazolam 0.5 mg 1/2 tab at midday prn and 1 tab hs for sleep.  Mild cognitive impairment- readjust cooping mechanisms, improve sleep hygiene and try to stay focused on immediate tasks at hand.  RTC 6-8 weeks for follow-up.

## 2013-02-14 ENCOUNTER — Encounter: Payer: Self-pay | Admitting: Family Medicine

## 2013-02-14 ENCOUNTER — Ambulatory Visit (INDEPENDENT_AMBULATORY_CARE_PROVIDER_SITE_OTHER): Payer: BC Managed Care – PPO | Admitting: Family Medicine

## 2013-02-14 VITALS — BP 100/66 | HR 67 | Temp 98.8°F | Resp 16 | Ht 67.0 in | Wt 189.0 lb

## 2013-02-14 DIAGNOSIS — F341 Dysthymic disorder: Secondary | ICD-10-CM

## 2013-02-14 DIAGNOSIS — R600 Localized edema: Secondary | ICD-10-CM

## 2013-02-14 DIAGNOSIS — R609 Edema, unspecified: Secondary | ICD-10-CM

## 2013-02-14 DIAGNOSIS — F418 Other specified anxiety disorders: Secondary | ICD-10-CM

## 2013-02-14 DIAGNOSIS — T50905A Adverse effect of unspecified drugs, medicaments and biological substances, initial encounter: Secondary | ICD-10-CM

## 2013-02-14 DIAGNOSIS — T887XXA Unspecified adverse effect of drug or medicament, initial encounter: Secondary | ICD-10-CM

## 2013-02-14 DIAGNOSIS — R5383 Other fatigue: Secondary | ICD-10-CM

## 2013-02-14 DIAGNOSIS — R5381 Other malaise: Secondary | ICD-10-CM

## 2013-02-14 MED ORDER — ENALAPRIL-HYDROCHLOROTHIAZIDE 5-12.5 MG PO TABS: 1.0000 | ORAL_TABLET | Freq: Every day | ORAL | Status: DC
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
## 2013-02-14 NOTE — Patient Instructions (Addendum)
For self-help: [name] is a very good Chartered loss adjuster who has dealt with anxiety and writes about it for [employer].

## 2013-04-06 ENCOUNTER — Other Ambulatory Visit: Payer: Self-pay

## 2013-04-06 ENCOUNTER — Encounter (HOSPITAL_BASED_OUTPATIENT_CLINIC_OR_DEPARTMENT_OTHER): Payer: Self-pay | Admitting: *Deleted

## 2013-04-06 ENCOUNTER — Emergency Department (HOSPITAL_BASED_OUTPATIENT_CLINIC_OR_DEPARTMENT_OTHER)
Admission: EM | Admit: 2013-04-06 | Discharge: 2013-04-07 | Disposition: A | Discharge status: Home / Self Care | Payer: BC Managed Care – PPO | Attending: Emergency Medicine | Admitting: Emergency Medicine

## 2013-04-06 DIAGNOSIS — M542 Cervicalgia: Secondary | ICD-10-CM | POA: Insufficient documentation

## 2013-04-06 DIAGNOSIS — R011 Cardiac murmur, unspecified: Secondary | ICD-10-CM | POA: Insufficient documentation

## 2013-04-06 DIAGNOSIS — R5381 Other malaise: Secondary | ICD-10-CM | POA: Insufficient documentation

## 2013-04-06 DIAGNOSIS — J45909 Unspecified asthma, uncomplicated: Secondary | ICD-10-CM | POA: Insufficient documentation

## 2013-04-06 DIAGNOSIS — Z8742 Personal history of other diseases of the female genital tract: Secondary | ICD-10-CM | POA: Insufficient documentation

## 2013-04-06 DIAGNOSIS — Z8679 Personal history of other diseases of the circulatory system: Secondary | ICD-10-CM | POA: Insufficient documentation

## 2013-04-06 DIAGNOSIS — Z8739 Personal history of other diseases of the musculoskeletal system and connective tissue: Secondary | ICD-10-CM | POA: Insufficient documentation

## 2013-04-06 DIAGNOSIS — I10 Essential (primary) hypertension: Secondary | ICD-10-CM | POA: Insufficient documentation

## 2013-04-06 DIAGNOSIS — M79609 Pain in unspecified limb: Secondary | ICD-10-CM | POA: Insufficient documentation

## 2013-04-06 DIAGNOSIS — IMO0002 Reserved for concepts with insufficient information to code with codable children: Secondary | ICD-10-CM | POA: Insufficient documentation

## 2013-04-06 DIAGNOSIS — R5383 Other fatigue: Secondary | ICD-10-CM | POA: Insufficient documentation

## 2013-04-06 DIAGNOSIS — Z79899 Other long term (current) drug therapy: Secondary | ICD-10-CM | POA: Insufficient documentation

## 2013-04-06 DIAGNOSIS — R61 Generalized hyperhidrosis: Secondary | ICD-10-CM | POA: Insufficient documentation

## 2013-04-06 DIAGNOSIS — R209 Unspecified disturbances of skin sensation: Secondary | ICD-10-CM | POA: Insufficient documentation

## 2013-04-06 LAB — BASIC METABOLIC PANEL
CO2: 29 mEq/L (ref 19–32)
Calcium: 9.9 mg/dL (ref 8.4–10.5)
Chloride: 102 mEq/L (ref 96–112)
Creatinine, Ser: 1 mg/dL (ref 0.50–1.10)
Glucose, Bld: 89 mg/dL (ref 70–99)

## 2013-04-06 LAB — URINALYSIS, ROUTINE W REFLEX MICROSCOPIC
Bilirubin Urine: NEGATIVE
Hgb urine dipstick: NEGATIVE
Ketones, ur: NEGATIVE mg/dL
Specific Gravity, Urine: 1.013 (ref 1.005–1.030)
pH: 6.5 (ref 5.0–8.0)

## 2013-04-06 LAB — CBC WITH DIFFERENTIAL/PLATELET
Eosinophils Absolute: 0.3 10*3/uL (ref 0.0–0.7)
Eosinophils Relative: 4 % (ref 0–5)
HCT: 37.5 % (ref 36.0–46.0)
Lymphocytes Relative: 35 % (ref 12–46)
Lymphs Abs: 2.5 10*3/uL (ref 0.7–4.0)
MCH: 31.8 pg (ref 26.0–34.0)
MCV: 93.3 fL (ref 78.0–100.0)
Monocytes Absolute: 0.6 10*3/uL (ref 0.1–1.0)
RBC: 4.02 MIL/uL (ref 3.87–5.11)
RDW: 12.3 % (ref 11.5–15.5)
WBC: 7.1 10*3/uL (ref 4.0–10.5)

## 2013-04-06 MED ORDER — ASPIRIN 81 MG PO CHEW
162.0000 mg | CHEWABLE_TABLET | Freq: Once | ORAL | Status: AC
  Administered 2013-04-06: 162 mg via ORAL
  Filled 2013-04-06: qty 2

## 2013-04-06 MED ORDER — ASPIRIN EC 325 MG PO TBEC: 325.0000 mg | DELAYED_RELEASE_TABLET | Freq: Every day | ORAL | Status: DC

## 2013-04-06 NOTE — ED Provider Notes (Addendum)
History     CSN: [id number]  Arrival date & time 04/06/13  2121   First MD Initiated Contact with Patient 04/06/13 2200      Chief Complaint  Patient presents with  . Neck Pain    (Consider location/radiation/quality/duration/timing/severity/associated sxs/prior treatment) HPI Comments: Pt comes in with cc of neck pain, arm pain, facial numbness bilaterally, generalized body weakness. Pt has hx of HTN, anxiety, and mild cognitive impairment and MVP. She is a poor historian. Pt reports that around 8:30 pm, pt was making dinner, and all of a sudden she started having left arm pain starting at the neck and bilateral lower facial "weird feeling and numbness" and just generalized weakness. Her sx lasted for unknown amount of time, but have since resolved. She denies any nausea, diaphoresis - does endorse sweating - but she is having off and on sweating since she is perimenopausal. No HL, DN, smoking hx , drug use. Does have hx of cath - about 5 years ago per patient that was negative.    Patient is a 52 y.o. female presenting with neck pain. The history is provided by the patient.  Neck Pain Associated symptoms: numbness and weakness   Associated symptoms: no chest pain and no headaches     Past Medical History  Diagnosis Date  . Hypertension   . Edema   . Pain in limb   . Back pain   . Asthma   . MVP (mitral valve prolapse) 12/22/2011  . S/P dilatation of esophageal stricture 12/22/2011    Past Surgical History  Procedure Laterality Date  . Abdominal hysterectomy    . Cardiac catheterization    . Tonsillectomy    . Spine surgery      Family History  Problem Relation Age of Onset  . Diabetes Mother   . Hypertension Mother   . Diabetes Father   . Hypertension Father   . Stroke Other   . Coronary artery disease Other     History  Substance Use Topics  . Smoking status: Never Smoker   . Smokeless tobacco: Never Used  . Alcohol Use: No    OB History   Grav Para  Term Preterm Abortions TAB SAB Ect Mult Living                  Review of Systems  Constitutional: Positive for activity change.  HENT: Positive for neck pain. Negative for facial swelling and neck stiffness.   Respiratory: Negative for cough, shortness of breath and wheezing.   Cardiovascular: Negative for chest pain.  Gastrointestinal: Negative for nausea, vomiting, abdominal pain, diarrhea, constipation, blood in stool and abdominal distention.  Genitourinary: Negative for dysuria, hematuria and difficulty urinating.  Skin: Negative for color change.  Neurological: Positive for weakness and numbness. Negative for speech difficulty and headaches.  Hematological: Does not bruise/bleed easily.  Psychiatric/Behavioral: Negative for confusion.    Allergies  Morphine and related; Pantoprazole sodium; and Prednisone  Home Medications   Current Outpatient Rx  Name  Route  Sig  Dispense  Refill  . betamethasone valerate lotion (VALISONE) 0.1 %      Apply to q-tip and swab twice a day until symptoms controlled then prn.   60 mL   1   . Diethylpropion HCl (TENUATE PO)   Oral   Take by mouth as needed (appetite suppressant.).          Marland Kitchen Enalapril-Hydrochlorothiazide 5-12.5 MG per tablet   Oral   Take 1 tablet by  mouth daily.   30 tablet   5   . EXPIRED: esomeprazole (NEXIUM) 40 MG capsule   Oral   Take 1 capsule (40 mg total) by mouth daily before breakfast.   13 capsule   0   . fish oil-omega-3 fatty acids 1000 MG capsule   Oral   Take 1 g by mouth daily.         . furosemide (LASIX) 20 MG tablet   Oral   Take 1 tablet (20 mg total) by mouth daily as needed. For swelling   30 tablet   2   . HYDROcodone-acetaminophen (NORCO) 5-325 MG per tablet   Oral   Take 1 tablet by mouth every 6 (six) hours as needed. For pain         . mometasone (NASONEX) 50 MCG/ACT nasal spray      1 spray in each nostril at bedtime.   17 g   5   . Multiple Vitamin  (MULITIVITAMIN WITH MINERALS) TABS   Oral   Take 1 tablet by mouth daily.         . NON FORMULARY      Herbal life shakes         . Nutritional Supp - Diet Aids (DIETERS SUPER TEA PO)   Oral   Take by mouth as needed (to aid in digestion.).           BP 164/94  Pulse 64  Temp(Src) 97.8 F (36.6 C) (Oral)  Resp 18  Ht 5\' 6"  (1.676 m)  Wt 198 lb (89.812 kg)  BMI 31.97 kg/m2  SpO2 100%  Physical Exam  Nursing note and vitals reviewed. Constitutional: She is oriented to person, place, and time. She appears well-developed and well-nourished.  HENT:  Head: Normocephalic and atraumatic.  Eyes: EOM are normal. Pupils are equal, round, and reactive to light.  Neck: Neck supple. No JVD present.  No midline c-spine tenderness, pt able to turn head to 45 degrees bilaterally without any pain and able to flex neck to the chest and extend without any pain or neurologic symptoms.   Cardiovascular: Normal rate, regular rhythm and intact distal pulses.   Murmur heard. Pulmonary/Chest: Effort normal. No respiratory distress.  Abdominal: Soft. She exhibits no distension. There is no tenderness. There is no rebound and no guarding.  Neurological: She is alert and oriented to person, place, and time.  Cerebellar exam is normal (finger to nose) Sensory exam normal for bilateral upper and lower extremities - and patient is able to discriminate between sharp and dull. Motor exam is 4+/5   Skin: Skin is warm and dry.    ED Course  Procedures (including critical care time)  Labs Reviewed  BASIC METABOLIC PANEL  CBC WITH DIFFERENTIAL  TROPONIN I  URINALYSIS, ROUTINE W REFLEX MICROSCOPIC   No results found.   No diagnosis found.    MDM  Pt comes in with several complains - all ties up to a specific event that occurred at 8:30 pm.  She has neck pain and pain shooting down her arm. Neck exam is normal. Pain is not reproducible with neck manipulation. Dont think this is  radiculopathy.  Pt has generalized weakness and bilateral facial numbness. No focal neuro complains, no focal neuro deficit on exam. Unlikely to be stroke.  Pt has sweating - but endorses being perimenopausal.  I considered ACS in the ddx as well, but she had a negative cath reportedly 5 years back and per Cards note  in 2012.  "I think that this patient is at low risk for cardiovascular complications of surgery. She has mild atypical chest pain symptoms. She has had extensive negative cardiac evaluation in the past. Her ECG today is normal and her exam is normal. We will go ahead and clear her for surgery."  I dont think this is ACS syndrome. Besides her cardiac risk factor is HTN and family hx only. She has a PCP and reports that she will see them in a week - which i think is appropriate for possible stress test.  Derwood Kaplan, MD 04/06/13 2303   Date: 04/06/2013  Rate: 57  Rhythm: normal sinus rhythm  QRS Axis: normal  Intervals: normal  ST/T Wave abnormalities: normal  Conduction Disutrbances: none  Narrative Interpretation: unremarkable      Derwood Kaplan, MD 04/06/13 2319

## 2013-04-06 NOTE — ED Notes (Signed)
Sudden onset of pain in her left neck with radiation into her head and face. States she got disoriented. Drove herself here. States she does not feel back to normal. Alert oriented. Follows command. Ambulatory with no difficulty.

## 2013-04-13 ENCOUNTER — Ambulatory Visit (INDEPENDENT_AMBULATORY_CARE_PROVIDER_SITE_OTHER): Payer: BC Managed Care – PPO | Admitting: Family Medicine

## 2013-04-13 ENCOUNTER — Encounter: Payer: Self-pay | Admitting: Family Medicine

## 2013-04-13 DIAGNOSIS — Z1231 Encounter for screening mammogram for malignant neoplasm of breast: Secondary | ICD-10-CM

## 2013-04-13 DIAGNOSIS — N951 Menopausal and female climacteric states: Secondary | ICD-10-CM

## 2013-04-13 DIAGNOSIS — Z780 Asymptomatic menopausal state: Secondary | ICD-10-CM

## 2013-04-13 DIAGNOSIS — I10 Essential (primary) hypertension: Secondary | ICD-10-CM

## 2013-04-13 NOTE — Patient Instructions (Signed)
I have ordered a mammogram to be done at [facility]; if it is not entirely covered by your insurer, then there is a financial assistance program available for women who need help to pay for a mammogram. I cannot prescribe hormone therapy for you until you have had this screening. As soon as I have the results of the mammogram, you will be contacted to discuss hormone replacement therapy to relieve your hot flashes.

## 2013-04-13 NOTE — Progress Notes (Signed)
S:  This 52 y.o. AA female has well controlled HTN; pt is compliant w/ medication w/o adverse effects. Main complaint today is severe hot flashes, > 15 per day. She has sleep disturbance due to night sweats w/ wet linens. Daytime episodes results in completely damp clothing, such that her employer advised that's he seek medical attention. She is miserable. She tried a "natural supplement" w/o benefit. Pt is s/p TAH many years ago; her ovaries are intact. She gives a hx of inability to tolerate OCPs decades ago due to "leg cramps".  Pt is also dealing w/ anxiety; she has significant financial stressors and has not had a MMG recently. Last MMG found in EMR is 2008; one of the staff here called several imaging facilities and the last MMG is verified in. 2008.  Pt was seen in [facility] ED on 04/06/13 for eval of neck and arm pain and paresthesia with facial numbness and generalized weakness; her eval was negative. She has a hx of MVP. Given her hx of negative cardiac cath ~ 5 years ago, it was suggested she be referred for stress test. She has significant stress contributing to these symptoms.  Patient Active Problem List   Diagnosis Date Noted  . Generalized anxiety disorder 12/27/2012  . 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 Allergic rhinitis 11/27/2012  . Hyperlipidemia 12/22/2011  . MVP (mitral valve prolapse) 12/22/2011  . S/P dilatation of esophageal stricture 12/22/2011  . Atypical chest pain 08/19/2011  . Hypertension     PMHx, Soc Hx and Fam Hx reviewed.  ROS: As per HPI.  O: Vital signs reviewed.      GEN: in NAD; WN,WD.      HENT: [place]/AT; EOMI w/ clear conj/sclerae. Otherwise unremarkable.      COR: RRR.      LUNGS: Normal resp rate and effort.      SKIN: W&D; no erythema.      NEURO: A&O x 3; CNs intact. Nonfocal.      PSYCH: Pt is attentive but obviously anxious; speech is slightly pressured and tangential. Thought content is normal. Judgement is good.  A/P: Menopause - pt advised that  HRT is not an option w/o MMG.  Plan: MM Digital Screening                         I will contact pt about HRT if MMG is neg and I feel that that is appropriate. She would like to start a low-dose hormone.  Other screening mammogram - Plan: MM Digital Screening  HTN, goal below 140/90- Continue current medication.

## 2013-04-20 ENCOUNTER — Ambulatory Visit (HOSPITAL_COMMUNITY): Payer: BC Managed Care – PPO

## 2013-04-20 ENCOUNTER — Telehealth: Payer: Self-pay

## 2013-04-20 NOTE — Telephone Encounter (Signed)
Pt was referred for a mammogram and could not remember where at.  Missed the appointment and would like one to be rescheduled.  [phone].

## 2013-04-21 NOTE — Telephone Encounter (Signed)
LMOM that she can schedule her own appointment but didn't know where it was. Lupita Leash can you help with this?

## 2013-05-01 ENCOUNTER — Other Ambulatory Visit: Payer: Self-pay | Admitting: Family Medicine

## 2013-05-09 ENCOUNTER — Ambulatory Visit: Payer: Self-pay | Admitting: Family Medicine

## 2013-06-16 ENCOUNTER — Other Ambulatory Visit: Payer: Self-pay | Admitting: Family Medicine

## 2013-08-08 ENCOUNTER — Encounter (HOSPITAL_BASED_OUTPATIENT_CLINIC_OR_DEPARTMENT_OTHER): Payer: Self-pay

## 2013-08-08 ENCOUNTER — Emergency Department (HOSPITAL_BASED_OUTPATIENT_CLINIC_OR_DEPARTMENT_OTHER): Payer: BC Managed Care – PPO

## 2013-08-08 ENCOUNTER — Emergency Department (HOSPITAL_BASED_OUTPATIENT_CLINIC_OR_DEPARTMENT_OTHER)
Admission: EM | Admit: 2013-08-08 | Discharge: 2013-08-08 | Disposition: A | Discharge status: Home / Self Care | Payer: BC Managed Care – PPO | Attending: Emergency Medicine | Admitting: Emergency Medicine

## 2013-08-08 DIAGNOSIS — Z7982 Long term (current) use of aspirin: Secondary | ICD-10-CM | POA: Insufficient documentation

## 2013-08-08 DIAGNOSIS — Z9889 Other specified postprocedural states: Secondary | ICD-10-CM | POA: Insufficient documentation

## 2013-08-08 DIAGNOSIS — Z79899 Other long term (current) drug therapy: Secondary | ICD-10-CM | POA: Insufficient documentation

## 2013-08-08 DIAGNOSIS — M7989 Other specified soft tissue disorders: Secondary | ICD-10-CM | POA: Insufficient documentation

## 2013-08-08 DIAGNOSIS — J45909 Unspecified asthma, uncomplicated: Secondary | ICD-10-CM | POA: Insufficient documentation

## 2013-08-08 DIAGNOSIS — I10 Essential (primary) hypertension: Secondary | ICD-10-CM | POA: Insufficient documentation

## 2013-08-08 DIAGNOSIS — IMO0002 Reserved for concepts with insufficient information to code with codable children: Secondary | ICD-10-CM | POA: Insufficient documentation

## 2013-08-08 DIAGNOSIS — R1032 Left lower quadrant pain: Secondary | ICD-10-CM | POA: Insufficient documentation

## 2013-08-08 LAB — CBC WITH DIFFERENTIAL/PLATELET
Eosinophils Absolute: 0.2 10*3/uL (ref 0.0–0.7)
Eosinophils Relative: 3 % (ref 0–5)
HCT: 36.5 % (ref 36.0–46.0)
Hemoglobin: 12.3 g/dL (ref 12.0–15.0)
Lymphs Abs: 2 10*3/uL (ref 0.7–4.0)
MCH: 30.7 pg (ref 26.0–34.0)
MCV: 91 fL (ref 78.0–100.0)
Monocytes Absolute: 0.5 10*3/uL (ref 0.1–1.0)
Monocytes Relative: 7 % (ref 3–12)
RBC: 4.01 MIL/uL (ref 3.87–5.11)

## 2013-08-08 LAB — COMPREHENSIVE METABOLIC PANEL
Alkaline Phosphatase: 73 U/L (ref 39–117)
BUN: 14 mg/dL (ref 6–23)
Calcium: 9.5 mg/dL (ref 8.4–10.5)
Creatinine, Ser: 0.9 mg/dL (ref 0.50–1.10)
GFR calc Af Amer: 84 mL/min — ABNORMAL LOW (ref >90)
Glucose, Bld: 106 mg/dL — ABNORMAL HIGH (ref 70–99)
Potassium: 3.9 mEq/L (ref 3.5–5.1)
Total Protein: 6.9 g/dL (ref 6.0–8.3)

## 2013-08-08 LAB — URINALYSIS, ROUTINE W REFLEX MICROSCOPIC
Ketones, ur: NEGATIVE mg/dL
Leukocytes, UA: NEGATIVE
Nitrite: NEGATIVE
pH: 7 (ref 5.0–8.0)

## 2013-08-08 MED ORDER — SODIUM CHLORIDE 0.9 % IV SOLN
Freq: Once | INTRAVENOUS | Status: AC
  Administered 2013-08-08: 12:00:00 via INTRAVENOUS

## 2013-08-08 MED ORDER — HYDROCODONE-ACETAMINOPHEN 5-325 MG PO TABS: 2.0000 | ORAL_TABLET | ORAL | Status: DC | PRN

## 2013-08-08 MED ORDER — IOHEXOL 300 MG/ML  SOLN
50.0000 mL | Freq: Once | INTRAMUSCULAR | Status: AC | PRN
  Administered 2013-08-08: 50 mL via ORAL

## 2013-08-08 MED ORDER — IOHEXOL 300 MG/ML  SOLN
100.0000 mL | Freq: Once | INTRAMUSCULAR | Status: AC | PRN
  Administered 2013-08-08: 100 mL via INTRAVENOUS

## 2013-08-08 NOTE — ED Provider Notes (Signed)
CSN: [id number]     Arrival date & time 08/08/13  1115 History   First MD Initiated Contact with Patient 08/08/13 1155     Chief Complaint  Patient presents with  . Hip Pain   (Consider location/radiation/quality/duration/timing/severity/associated sxs/prior Treatment) HPI Comments: Patient is a 52 year old female with past medical history significant for acid reflux and peripheral edema. She presents to the emergency department with complaints of pain in the left hip and left lower quadrant. She states this has been going on for the past 2 months and has been gradually worsening. She denies any injury or trauma. She tells me that she's been having intermittent swelling in the left leg. She denies any chest pain or shortness of breath. She denies any nausea vomiting or diarrhea. She feels as though she has a lump in her left groin and feels as though her abdomen is more distended than it normally is. She denies urinary complaints  Patient is a 52 y.o. female presenting with hip pain. The history is provided by the patient.  Hip Pain This is a new problem. Episode onset: 2 months ago. The problem occurs constantly. The problem has been gradually worsening. Associated symptoms include abdominal pain. Nothing aggravates the symptoms. Nothing relieves the symptoms. She has tried nothing for the symptoms. The treatment provided no relief.    Past Medical History  Diagnosis Date  . Hypertension   . Edema   . Pain in limb   . Back pain   . Asthma   . MVP (mitral valve prolapse) 12/22/2011  . S/P dilatation of esophageal stricture 12/22/2011   Past Surgical History  Procedure Laterality Date  . Abdominal hysterectomy    . Cardiac catheterization    . Tonsillectomy    . Spine surgery     Family History  Problem Relation Age of Onset  . Diabetes Mother   . Hypertension Mother   . Diabetes Father   . Hypertension Father   . Stroke Other   . Coronary artery disease Other    History   Substance Use Topics  . Smoking status: Never Smoker   . Smokeless tobacco: Never Used  . Alcohol Use: No   OB History   Grav Para Term Preterm Abortions TAB SAB Ect Mult Living                 Review of Systems  Gastrointestinal: Positive for abdominal pain.  All other systems reviewed and are negative.    Allergies  Morphine and related; Pantoprazole sodium; and Prednisone  Home Medications   Current Outpatient Rx  Name  Route  Sig  Dispense  Refill  . aspirin EC 325 MG tablet   Oral   Take 1 tablet (325 mg total) by mouth daily.   30 tablet   0   . betamethasone valerate lotion (VALISONE) 0.1 %      Apply to q-tip and swab twice a day until symptoms controlled then prn.   60 mL   1   . Diethylpropion HCl (TENUATE PO)   Oral   Take by mouth as needed (appetite suppressant.).          Marland Kitchen Enalapril-Hydrochlorothiazide 5-12.5 MG per tablet   Oral   Take 1 tablet by mouth daily.   30 tablet   5   . EXPIRED: esomeprazole (NEXIUM) 40 MG capsule   Oral   Take 1 capsule (40 mg total) by mouth daily before breakfast.   13 capsule   0   .  fish oil-omega-3 fatty acids 1000 MG capsule   Oral   Take 1 g by mouth daily.         . furosemide (LASIX) 20 MG tablet      TAKE ONE TABLET BY MOUTH EVERY DAY AS NEEDED FOR  SWELLING   30 tablet   0   . furosemide (LASIX) 20 MG tablet      TAKE ONE TABLET BY MOUTH EVERY DAY AS NEEDED FOR  SWELLING   30 tablet   0   . HYDROcodone-acetaminophen (NORCO) 5-325 MG per tablet   Oral   Take 1 tablet by mouth every 6 (six) hours as needed. For pain         . mometasone (NASONEX) 50 MCG/ACT nasal spray      1 spray in each nostril at bedtime.   17 g   5   . Multiple Vitamin (MULITIVITAMIN WITH MINERALS) TABS   Oral   Take 1 tablet by mouth daily.         . NON FORMULARY      Herbal life shakes         . Nutritional Supp - Diet Aids (DIETERS SUPER TEA PO)   Oral   Take by mouth as needed (to aid in  digestion.).          BP 156/93  Pulse 77  Temp(Src) 98 F (36.7 C) (Oral)  Resp 20  Ht 5\' 6"  (1.676 m)  Wt 195 lb (88.451 kg)  BMI 31.49 kg/m2  SpO2 100% Physical Exam  Nursing note and vitals reviewed. Constitutional: She is oriented to person, place, and time. She appears well-developed and well-nourished. No distress.  HENT:  Head: Normocephalic and atraumatic.  Neck: Normal range of motion. Neck supple.  Cardiovascular: Normal rate and regular rhythm.  Exam reveals no gallop and no friction rub.   No murmur heard. Pulmonary/Chest: Effort normal and breath sounds normal. No respiratory distress. She has no wheezes.  Abdominal: Soft. Bowel sounds are normal. She exhibits no distension. There is tenderness.  There is tenderness to palpation in the left lower quadrant and left pelvic region. There is no rebound and no guarding. Bowel sounds are present. I am unable to palpate a definite mass.  Musculoskeletal: Normal range of motion.  Neurological: She is alert and oriented to person, place, and time.  Skin: Skin is warm and dry. She is not diaphoretic.    ED Course  Procedures (including critical care time) Labs Review Labs Reviewed  CBC WITH DIFFERENTIAL  COMPREHENSIVE METABOLIC PANEL   Imaging Review No results found.  MDM  No diagnosis found. Patient presents here with complaints of left lower quadrant pain that has been going on for the past several weeks. Pain radiates into her left hip and she feels more distended in this area then she has been in the past. She also states that her left leg swells off and on which is not been given a good explanation for her. For this reason a CT scan of the abdomen and pelvis was performed which did not reveal any acute abnormalities. There were no masses or hernias in this area that would explain her pain. Urinalysis and laboratory studies are otherwise unremarkable. At this point who does not appear in acute process it is in need  of further intervention. She will be treated with pain medication and is to follow up with her primary care Dr. if not improving in the next several days.   Riley Lam  Milika Ventress, MD 08/08/13 1356

## 2013-08-08 NOTE — ED Notes (Signed)
Left hip pain x 2 months-denies injury-steady gait to triage

## 2013-10-12 ENCOUNTER — Ambulatory Visit (INDEPENDENT_AMBULATORY_CARE_PROVIDER_SITE_OTHER): Payer: BC Managed Care – PPO | Admitting: Family Medicine

## 2013-10-12 ENCOUNTER — Encounter: Payer: Self-pay | Admitting: Family Medicine

## 2013-10-12 ENCOUNTER — Ambulatory Visit: Payer: BC Managed Care – PPO

## 2013-10-12 VITALS — BP 130/80 | HR 66 | Temp 98.2°F | Resp 16 | Ht 66.75 in | Wt 198.0 lb

## 2013-10-12 DIAGNOSIS — M5030 Other cervical disc degeneration, unspecified cervical region: Secondary | ICD-10-CM

## 2013-10-12 DIAGNOSIS — R0989 Other specified symptoms and signs involving the circulatory and respiratory systems: Secondary | ICD-10-CM

## 2013-10-12 DIAGNOSIS — R0609 Other forms of dyspnea: Secondary | ICD-10-CM

## 2013-10-12 DIAGNOSIS — M25529 Pain in unspecified elbow: Secondary | ICD-10-CM

## 2013-10-12 DIAGNOSIS — R0600 Dyspnea, unspecified: Secondary | ICD-10-CM

## 2013-10-12 DIAGNOSIS — Z131 Encounter for screening for diabetes mellitus: Secondary | ICD-10-CM

## 2013-10-12 LAB — RHEUMATOID FACTOR: Rhuematoid fact SerPl-aCnc: <10 IU/mL (ref <=14)

## 2013-10-12 MED ORDER — HYDROCODONE-ACETAMINOPHEN 5-325 MG PO TABS: ORAL_TABLET | ORAL | Status: DC

## 2013-10-12 MED ORDER — ENALAPRIL-HYDROCHLOROTHIAZIDE 5-12.5 MG PO TABS: 1.0000 | ORAL_TABLET | Freq: Every day | ORAL | Status: DC

## 2013-10-12 MED ORDER — ALBUTEROL SULFATE HFA 108 (90 BASE) MCG/ACT IN AERS: 2.0000 | INHALATION_SPRAY | Freq: Four times a day (QID) | RESPIRATORY_TRACT | Status: DC | PRN

## 2013-10-12 MED ORDER — GABAPENTIN 100 MG PO CAPS: 100.0000 mg | ORAL_CAPSULE | Freq: Three times a day (TID) | ORAL | Status: DC

## 2013-10-12 NOTE — Patient Instructions (Signed)

## 2013-10-12 NOTE — Progress Notes (Signed)
S:  This 52 y.o. AA female presents w/ c/o bilateral arm and wrist pain w/ weakness in hands. This has progressively worsened in last 2 months. Pt is in school and does a lot of writing. She is starting to drop things and cannot use hands very well. They feel swollen and painful. Wrist joints hurt as well as shoulder joints (L>>R). Pt has hx of cervical disc disease, diagnosed 2+ years ago by Dr. Franky Macho; pt is s/p lumbar DDD surgery performed by Dr. Franky Macho in October 2012. She reports that he diagnosed cervical spine problem and immediately recommended surgery. At that time, she was being evaluated for chest pain that started 3 weeks after endoscopic procedure in Feb 2013. She was very apprehensive about having any procedures at that time. C-spine films did show degenerative spondylosis C4-5 and C5-6 w/ disc space narrowing. Pt did not follow-up w/ Dr. Franky Macho. She denies recent trauma or fall or injury to neck or upper back.  Pt also c/o SOB. She states she is "asthmatic" but does not use an inhaler. She c/o post-exercise dyspnea and mild cough but no nocturnal symptoms.  While giving history, pt becomes tearful and is clearly upset by these physical problems. She states she is frustrated and just wants to give up.  Patient Active Problem List   Diagnosis Date Noted  . Generalized anxiety disorder 12/27/2012  . 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 Allergic rhinitis 11/27/2012  . Hyperlipidemia 12/22/2011  . MVP (mitral valve prolapse) 12/22/2011  . S/P dilatation of esophageal stricture 12/22/2011  . Atypical chest pain 08/19/2011  . Hypertension    PMHx, Surg Hx, Soc and Fam Hx (strong hx of DM) reviewed.  Medications reconciled.  ROS: As per HPI.  O: Filed Vitals:   10/12/13 1035  BP: 130/80  Pulse: 66  Temp: 98.2 F (36.8 C)  Resp: 16   GEN: In mild distress and anxious. WN,WD. Pt has difficulty giving coherent hx due to anxiety. HENT: [place]/AT; EOMI w/ clear conj/sclerae. EACs/TMs/  nasal mucosa/ oroph unremarkable. NECK: Decreased ROM w/ restricted flexion/extension, rotation. Tender paravertebral muscles. COR: RRR. No m/g/r. LUNGS: CTA; normal resp rate and effort. BACK: Spine straight w/ mild paraspinous muscle spasms. Well healed lumbar surgical scar. MS: MAEs; decreased ROM in shoulders w/ tenderness in shoulder girdle muscles. NEURO: A&O x 3; CNs intact. DTRs 1+ but difficult to illicit. PSYCH: Tearful w/ flat affect. Speech pattern somewhat tangentialand hard to follow pt's history. Thought process within normal limits.     Results for orders placed in visit on 10/12/13  POCT GLYCOSYLATED HEMOGLOBIN (HGB A1C)      Result Value Range   Hemoglobin A1C 5.1      [facility] reading (PRIMARY) by  Dr. Audria Nine: Cervical spine-  Degenerative changes in mid and lower levels. Loss of normal curvature.  A/P: DDD (degenerative disc disease), cervical - Chronic problem that may require MRI; trial of Gabapentin.  Plan: DG Cervical Spine 2 or 3 views  Pain in joint, upper arm, unspecified laterality - Pt currently on winter break from classes so arm pain has subsided to some degree w/ rest.  Plan: Sedimentation Rate, Rheumatoid factor; trial of Gabapentin 100 mg 1 capsule tid.  Screening for diabetes mellitus - Plan: POCT glycosylated hemoglobin (Hb A1C)  Dyspnea- Pt has EIA, needs Albuterol MDI for prn use.  Meds ordered this encounter  Medications  . Enalapril-Hydrochlorothiazide 5-12.5 MG per tablet    Sig: Take 1 tablet by mouth daily.    Dispense:  30 tablet    Refill:  5  . albuterol (PROVENTIL HFA;VENTOLIN HFA) 108 (90 BASE) MCG/ACT inhaler    Sig: Inhale 2 puffs into the lungs every 6 (six) hours as needed for wheezing or shortness of breath.    Dispense:  1 Inhaler    Refill:  3  . HYDROcodone-acetaminophen (NORCO) 5-325 MG per tablet    Sig: Take 1 tablet by mouth every 6 hours prn moderate pain.    Dispense:  30 tablet    Refill:  0  . gabapentin  (NEURONTIN) 100 MG capsule    Sig: Take 1 capsule (100 mg total) by mouth 3 (three) times daily.    Dispense:  90 capsule    Refill:  3

## 2013-10-13 NOTE — Progress Notes (Signed)
Quick Note:  Please notify pt that results are normal.   Provide pt with copy of labs. ______ 

## 2013-11-02 ENCOUNTER — Other Ambulatory Visit: Payer: Self-pay | Admitting: Physician Assistant

## 2013-11-08 ENCOUNTER — Ambulatory Visit (INDEPENDENT_AMBULATORY_CARE_PROVIDER_SITE_OTHER): Payer: BC Managed Care – PPO | Admitting: Family Medicine

## 2013-11-08 ENCOUNTER — Encounter: Payer: Self-pay | Admitting: Family Medicine

## 2013-11-08 VITALS — BP 170/98 | HR 59 | Temp 98.0°F | Resp 16 | Ht 67.0 in | Wt 199.0 lb

## 2013-11-08 DIAGNOSIS — R29898 Other symptoms and signs involving the musculoskeletal system: Secondary | ICD-10-CM

## 2013-11-08 DIAGNOSIS — M5030 Other cervical disc degeneration, unspecified cervical region: Secondary | ICD-10-CM

## 2013-11-08 DIAGNOSIS — N951 Menopausal and female climacteric states: Secondary | ICD-10-CM

## 2013-11-08 MED ORDER — FUROSEMIDE 20 MG PO TABS: ORAL_TABLET | ORAL | Status: DC

## 2013-11-08 NOTE — Patient Instructions (Signed)
Weakness in arms- I think this is related to arthritis in your cervical spine.  Take Gabapentin 3 times a day as prescribed. Use moist topical heat for muscle spasms. Physical therapy may be helpful.

## 2013-11-09 ENCOUNTER — Encounter: Payer: Self-pay | Admitting: Family Medicine

## 2013-11-09 DIAGNOSIS — M5030 Other cervical disc degeneration, unspecified cervical region: Secondary | ICD-10-CM | POA: Insufficient documentation

## 2013-11-09 NOTE — Progress Notes (Signed)
S:  This 53 y.o. AA female returns for f/u of neck discomfort (diagnosis= C-spine DDD) and persistent arm and hand numbness. 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 Pt uses the computer w/o  Difficulty. She c/o weakness when holding objects. Sleeps on a supportive pillow. Pt has known C-spine DDD and was advised years ago about possibility of surgery. She declined at that time. Gabapentin 100 mg tid has been prescribed but pt is taking it and HC-APAP 5- 325 mg tab "as needed". In general, she is not pleased about all the medications that she takes.  Pt very concerned about weight gain despite daily walking. She also has severe hot flashes. Pt resumed an OTC "Estroblend" and a multivitamin to reduce these symptoms. She continues to use an OTC weight loss supplement "on occasion" but expresses frustration w/ fat layer around the waist and hips. Tenuate appetite suppressant was effective but pt stopped taking this.  Discussion of recent labs (all normal) further frustrates pt. Financial constraints limit further evaluation at this point (MR of C-spine or NCV to rule out CTS).  Patient Active Problem List   Diagnosis Date Noted  . Generalized anxiety disorder 12/27/2012  . 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 Allergic rhinitis 11/27/2012  . Hyperlipidemia 12/22/2011  . MVP (mitral valve prolapse) 12/22/2011  . S/P dilatation of esophageal stricture 12/22/2011  . Atypical chest pain 08/19/2011  . Hypertension    PMHx, Surg Hx, Soc and Fam Hx reviewed.   Medications reconciled.  ROS: As per HPI. Otherwise noncontributory.  O: Filed Vitals:   11/08/13 1011  BP: 170/98  Pulse: 59  Temp: 98 F (36.7 C)  Resp: 16   GEN: In NAD; WN,WD. HENT: [place]/AT; EOMI w/ clear conj/sclerae. Otherwise unremarkable. NECK: Decreased ROM w/ muscle spasms and tenderness. COR: RRR. LUNGS: Unlabored resp. MS: Wrists/ Hands- FROM w/ minimal  swelling at wrists. Thenar eminence and 1st MCP tender. No joint deformities, redness or effusions. Pt does demonstrates an early trigger finger on R hand. NV intact.  NEURO: A&O x 3; CNs intact. Grip is good. Nonfocal. PSYCH: Pleasant but anxious. She displays frustration but is not angry or inappropriate. Eye contact is fair. Occasionally tearful. Speech is rushed at times and tangential. Thought content is normal and judgement is sound.  I reviewed images (plain films) of cervical spine with pt. MR C-spine would provide more information about degree of DDD but cost is prohibitive as is referral to specialist.  A/P: DDD (degenerative disc disease), cervical - Plan: Ambulatory referral to Physical Therapy Gabapentin to be taken tid as prescribed.  Weakness of both arms- Re-eval in 6 weeks.  Menopause syndrome- Continue current management. Discussed use of new medication (low dose Paroxetine 7.5 mg which is formulated for treatment of menopause symptoms). Pt declines as she has taken Paroxetine is the past for anxiety disorder; she does not want to resume medication of this type but she will consider it if hot flashes and night sweats continue.

## 2013-11-14 ENCOUNTER — Emergency Department (HOSPITAL_COMMUNITY): Payer: BC Managed Care – PPO

## 2013-11-14 ENCOUNTER — Ambulatory Visit: Payer: BC Managed Care – PPO | Admitting: Internal Medicine

## 2013-11-14 ENCOUNTER — Encounter (HOSPITAL_COMMUNITY): Payer: Self-pay | Admitting: Emergency Medicine

## 2013-11-14 ENCOUNTER — Emergency Department (HOSPITAL_COMMUNITY)
Admission: EM | Admit: 2013-11-14 | Discharge: 2013-11-14 | Disposition: A | Discharge status: Home / Self Care | Payer: BC Managed Care – PPO | Attending: Emergency Medicine | Admitting: Emergency Medicine

## 2013-11-14 DIAGNOSIS — B9789 Other viral agents as the cause of diseases classified elsewhere: Secondary | ICD-10-CM | POA: Insufficient documentation

## 2013-11-14 DIAGNOSIS — IMO0002 Reserved for concepts with insufficient information to code with codable children: Secondary | ICD-10-CM | POA: Insufficient documentation

## 2013-11-14 DIAGNOSIS — J45901 Unspecified asthma with (acute) exacerbation: Secondary | ICD-10-CM | POA: Insufficient documentation

## 2013-11-14 DIAGNOSIS — Z79899 Other long term (current) drug therapy: Secondary | ICD-10-CM | POA: Insufficient documentation

## 2013-11-14 DIAGNOSIS — R630 Anorexia: Secondary | ICD-10-CM | POA: Insufficient documentation

## 2013-11-14 DIAGNOSIS — R509 Fever, unspecified: Secondary | ICD-10-CM | POA: Insufficient documentation

## 2013-11-14 DIAGNOSIS — Z7982 Long term (current) use of aspirin: Secondary | ICD-10-CM | POA: Insufficient documentation

## 2013-11-14 DIAGNOSIS — M542 Cervicalgia: Secondary | ICD-10-CM | POA: Insufficient documentation

## 2013-11-14 DIAGNOSIS — R350 Frequency of micturition: Secondary | ICD-10-CM | POA: Insufficient documentation

## 2013-11-14 DIAGNOSIS — R5383 Other fatigue: Secondary | ICD-10-CM

## 2013-11-14 DIAGNOSIS — R110 Nausea: Secondary | ICD-10-CM | POA: Insufficient documentation

## 2013-11-14 DIAGNOSIS — R51 Headache: Secondary | ICD-10-CM | POA: Insufficient documentation

## 2013-11-14 DIAGNOSIS — R109 Unspecified abdominal pain: Secondary | ICD-10-CM | POA: Insufficient documentation

## 2013-11-14 DIAGNOSIS — Z9889 Other specified postprocedural states: Secondary | ICD-10-CM | POA: Insufficient documentation

## 2013-11-14 DIAGNOSIS — I10 Essential (primary) hypertension: Secondary | ICD-10-CM | POA: Insufficient documentation

## 2013-11-14 DIAGNOSIS — R5381 Other malaise: Secondary | ICD-10-CM | POA: Insufficient documentation

## 2013-11-14 DIAGNOSIS — B349 Viral infection, unspecified: Secondary | ICD-10-CM | POA: Diagnosis present

## 2013-11-14 LAB — POCT I-STAT, CHEM 8
BUN: 5 mg/dL — AB (ref 6–23)
CALCIUM ION: 1.11 mmol/L — AB (ref 1.12–1.23)
Chloride: 102 mEq/L (ref 96–112)
Creatinine, Ser: 0.9 mg/dL (ref 0.50–1.10)
Glucose, Bld: 96 mg/dL (ref 70–99)
HCT: 43 % (ref 36.0–46.0)
Hemoglobin: 14.6 g/dL (ref 12.0–15.0)
POTASSIUM: 3.7 meq/L (ref 3.7–5.3)
Sodium: 141 mEq/L (ref 137–147)
TCO2: 29 mmol/L (ref 0–100)

## 2013-11-14 LAB — URINALYSIS W MICROSCOPIC + REFLEX CULTURE
Bilirubin Urine: NEGATIVE
Glucose, UA: NEGATIVE mg/dL
HGB URINE DIPSTICK: NEGATIVE
KETONES UR: NEGATIVE mg/dL
Leukocytes, UA: NEGATIVE
Nitrite: NEGATIVE
PH: 7.5 (ref 5.0–8.0)
PROTEIN: NEGATIVE mg/dL
Specific Gravity, Urine: 1.018 (ref 1.005–1.030)
Urobilinogen, UA: 0.2 mg/dL (ref 0.0–1.0)

## 2013-11-14 MED ORDER — ACETAMINOPHEN 325 MG PO TABS
650.0000 mg | ORAL_TABLET | Freq: Once | ORAL | Status: AC
  Administered 2013-11-14: 650 mg via ORAL
  Filled 2013-11-14: qty 2

## 2013-11-14 MED ORDER — HYDROCHLOROTHIAZIDE 12.5 MG PO CAPS
12.5000 mg | ORAL_CAPSULE | ORAL | Status: AC
  Administered 2013-11-14: 12.5 mg via ORAL
  Filled 2013-11-14: qty 1

## 2013-11-14 MED ORDER — HYDROCOD POLST-CHLORPHEN POLST 10-8 MG/5ML PO LQCR: 5.0000 mL | Freq: Two times a day (BID) | ORAL | Status: DC | PRN

## 2013-11-14 MED ORDER — DIPHENHYDRAMINE HCL 50 MG/ML IJ SOLN
12.5000 mg | Freq: Once | INTRAMUSCULAR | Status: AC
  Administered 2013-11-14: 12.5 mg via INTRAVENOUS
  Filled 2013-11-14: qty 1

## 2013-11-14 MED ORDER — SODIUM CHLORIDE 0.9 % IV BOLUS (SEPSIS)
1000.0000 mL | INTRAVENOUS | Status: AC
  Administered 2013-11-14: 1000 mL via INTRAVENOUS

## 2013-11-14 MED ORDER — ALBUTEROL SULFATE HFA 108 (90 BASE) MCG/ACT IN AERS
6.0000 | INHALATION_SPRAY | Freq: Once | RESPIRATORY_TRACT | Status: AC
  Administered 2013-11-14: 6 via RESPIRATORY_TRACT
  Filled 2013-11-14: qty 6.7

## 2013-11-14 MED ORDER — ENALAPRIL MALEATE 5 MG PO TABS
5.0000 mg | ORAL_TABLET | ORAL | Status: AC
  Administered 2013-11-14: 5 mg via ORAL
  Filled 2013-11-14: qty 1

## 2013-11-14 MED ORDER — METOCLOPRAMIDE HCL 5 MG/ML IJ SOLN
5.0000 mg | Freq: Once | INTRAMUSCULAR | Status: AC
  Administered 2013-11-14: 5 mg via INTRAVENOUS
  Filled 2013-11-14: qty 2

## 2013-11-14 NOTE — ED Provider Notes (Signed)
CSN: [id number]     Arrival date & time 11/14/13  0827 History   First MD Initiated Contact with Patient 11/14/13 [phone]     Chief Complaint  Patient presents with  . Abdominal Pain  . Cough  . Nausea   (Consider location/radiation/quality/duration/timing/severity/associated sxs/prior Treatment) Patient is a 53 y.o. female presenting with cough and URI. The history is provided by the patient.  Cough Associated symptoms: chills, fever (subjective), headaches and wheezing   Associated symptoms: no chest pain and no shortness of breath   URI Presenting symptoms: cough, fatigue and fever (subjective)   Presenting symptoms: no congestion   Severity:  Moderate Onset quality:  Gradual Duration:  6 days Timing:  Constant Progression:  Unchanged Chronicity:  New Relieved by:  Nothing Worsened by:  Nothing tried Ineffective treatments:  None tried Associated symptoms: headaches, neck pain, sinus pain and wheezing     Past Medical History  Diagnosis Date  . Hypertension   . Edema   . Pain in limb   . Back pain   . Asthma   . MVP (mitral valve prolapse) 12/22/2011  . S/P dilatation of esophageal stricture 12/22/2011   Past Surgical History  Procedure Laterality Date  . Abdominal hysterectomy    . Cardiac catheterization    . Tonsillectomy    . Spine surgery     Family History  Problem Relation Age of Onset  . Diabetes Mother   . Hypertension Mother   . Diabetes Father   . Hypertension Father   . Stroke Other   . Coronary artery disease Other    History  Substance Use Topics  . Smoking status: Never Smoker   . Smokeless tobacco: Never Used  . Alcohol Use: No   OB History   Grav Para Term Preterm Abortions TAB SAB Ect Mult Living                 Review of Systems  Constitutional: Positive for fever (subjective), chills, appetite change and fatigue.  HENT: Negative for congestion and drooling.   Eyes: Negative for pain.  Respiratory: Positive for cough and wheezing.  Negative for shortness of breath.   Cardiovascular: Negative for chest pain.  Gastrointestinal: Positive for nausea. Negative for vomiting, abdominal pain and diarrhea.  Genitourinary: Positive for frequency. Negative for dysuria and hematuria.  Musculoskeletal: Positive for neck pain. Negative for back pain and gait problem.  Skin: Negative for color change.  Neurological: Positive for headaches. Negative for dizziness.  Hematological: Negative for adenopathy.  Psychiatric/Behavioral: Negative for behavioral problems.  All other systems reviewed and are negative.    Allergies  Morphine and related; Pantoprazole sodium; and Prednisone  Home Medications   Current Outpatient Rx  Name  Route  Sig  Dispense  Refill  . albuterol (PROVENTIL HFA;VENTOLIN HFA) 108 (90 BASE) MCG/ACT inhaler   Inhalation   Inhale 2 puffs into the lungs every 6 (six) hours as needed for wheezing or shortness of breath.   1 Inhaler   3   . aspirin EC 325 MG tablet   Oral   Take 1 tablet (325 mg total) by mouth daily.   30 tablet   0   . betamethasone valerate lotion (VALISONE) 0.1 %      Apply to q-tip and swab twice a day until symptoms controlled then prn.   60 mL   1   . Bioflavonoid Products (VITAMIN C) CHEW   Oral   Chew by mouth daily.         Marland Kitchen  CALCIUM PO   Oral   Take by mouth.         . Cholecalciferol (VITAMIN D PO)   Oral   Take by mouth daily.         . Enalapril-Hydrochlorothiazide 5-12.5 MG per tablet   Oral   Take 1 tablet by mouth daily.   30 tablet   5   . EXPIRED: esomeprazole (NEXIUM) 40 MG capsule   Oral   Take 1 capsule (40 mg total) by mouth daily before breakfast.   13 capsule   0   . fish oil-omega-3 fatty acids 1000 MG capsule   Oral   Take 1 g by mouth daily.         . furosemide (LASIX) 20 MG tablet      TAKE ONE TABLET BY MOUTH ONCE DAILY AS NEEDED FOR  SWELLING   30 tablet   2   . gabapentin (NEURONTIN) 100 MG capsule   Oral   Take 1  capsule (100 mg total) by mouth 3 (three) times daily.   90 capsule   3   . HYDROcodone-acetaminophen (NORCO) 5-325 MG per tablet      Take 1 tablet by mouth every 6 hours prn moderate pain.   30 tablet   0   . mometasone (NASONEX) 50 MCG/ACT nasal spray      1 spray in each nostril at bedtime.   17 g   5   . Multiple Vitamin (MULITIVITAMIN WITH MINERALS) TABS   Oral   Take 1 tablet by mouth daily.         . Nutritional Supp - Diet Aids (DIETERS SUPER TEA PO)   Oral   Take by mouth as needed (to aid in digestion.).         Marland Kitchen Nutritional Supplements (EQ ESTROBLEND MENOPAUSE PO)   Oral   Take by mouth daily.         Marland Kitchen VITAMIN E PO   Oral   Take by mouth daily.          BP 183/103  Pulse 65  Temp(Src) 98 F (36.7 C) (Oral)  SpO2 100% Physical Exam  Nursing note and vitals reviewed. Constitutional: She is oriented to person, place, and time. She appears well-developed and well-nourished.  HENT:  Head: Normocephalic.  Mouth/Throat: Oropharynx is clear and moist. No oropharyngeal exudate.  Eyes: Conjunctivae and EOM are normal. Pupils are equal, round, and reactive to light.  Neck: Normal range of motion. Neck supple.  Cardiovascular: Normal rate, regular rhythm, normal heart sounds and intact distal pulses.  Exam reveals no gallop and no friction rub.   No murmur heard. Pulmonary/Chest: Effort normal and breath sounds normal. No respiratory distress. She has no wheezes.  Abdominal: Soft. Bowel sounds are normal. There is no tenderness. There is no rebound and no guarding.  Musculoskeletal: Normal range of motion. She exhibits no edema and no tenderness.  Neurological: She is alert and oriented to person, place, and time.  Skin: Skin is warm and dry.  Psychiatric: She has a normal mood and affect. Her behavior is normal.    ED Course  Procedures (including critical care time) Labs Review Labs Reviewed  URINALYSIS W MICROSCOPIC + REFLEX CULTURE - Abnormal;  Notable for the following:    APPearance CLOUDY (*)    All other components within normal limits  POCT I-STAT, CHEM 8 - Abnormal; Notable for the following:    BUN 5 (*)    Calcium, Ion 1.11 (*)  All other components within normal limits   Imaging Review Dg Chest 2 View  11/14/2013   CLINICAL DATA:  Cough and shortness of breath  EXAM: CHEST  2 VIEW  COMPARISON:  December 19, 2012  FINDINGS: Lungs are clear. Heart size and pulmonary vascularity are normal. No adenopathy. No bone lesions.  IMPRESSION: No abnormality noted.   Electronically Signed   By: Lowella Grip M.D.   On: 11/14/2013 09:24    EKG Interpretation   None       MDM   1. Viral syndrome    9:03 AM 53 y.o. female who presents with malaise, subjective fever, chills, nonproductive cough, sinus congestion, and soreness to her ribs and abdomen for approximately 6 days. She is afebrile and her initial blood pressure shows that she is hypertensive here. I suspect she has a viral syndrome such as influenza. She notes that she has had decreased by mouth intake at home. She is a history of asthma and some mild shortness of breath. She has mild wheezing bilaterally on exam. Will treat her symptomatically and get a screening I stat chem 8. I suspect her diffuse mild abd/rib soreness is assoc w/ her cough.   10:58 AM: I interpreted/reviewed the labs and/or imaging which were non-contributory.  Pt feeling mildly better. Will rec sx therapy. Tussionex Rx for home. I have discussed the diagnosis/risks/treatment options with the patient and believe the pt to be eligible for discharge home to follow-up with pcp as needed. We also discussed returning to the ED immediately if new or worsening sx occur. We discussed the sx which are most concerning (e.g., inability to tolerate po, fever, ) that necessitate immediate return. Any new prescriptions provided to the patient are listed below.  New Prescriptions   CHLORPHENIRAMINE-HYDROCODONE  (TUSSIONEX PENNKINETIC ER) 10-8 MG/5ML LQCR    Take 5 mLs by mouth every 12 (twelve) hours as needed for cough.      Blanchard Kelch, MD 11/14/13 1058

## 2013-11-14 NOTE — ED Notes (Addendum)
Pt c/o headache, cough, nausea, feeling bad x 6 days, frequent urination x 2 days. Does take BP meds but has not taken it this morning. Pt states when she touches stomach its feels like needle sticks.

## 2013-11-14 NOTE — Discharge Instructions (Signed)

## 2013-12-21 ENCOUNTER — Telehealth: Payer: Self-pay

## 2013-12-21 NOTE — Telephone Encounter (Signed)
Kalman Drape, a case nurse with [employer], calling to let Dr. Leward Quan know that patient is currently enrolled in the nurse case management program and receives free services through this.   [phone]

## 2014-01-30 ENCOUNTER — Ambulatory Visit (INDEPENDENT_AMBULATORY_CARE_PROVIDER_SITE_OTHER): Payer: BC Managed Care – PPO | Admitting: Family

## 2014-01-30 ENCOUNTER — Telehealth: Payer: Self-pay | Admitting: Family

## 2014-01-30 ENCOUNTER — Encounter: Payer: Self-pay | Admitting: Family

## 2014-01-30 VITALS — BP 124/84 | Temp 98.2°F | Ht 67.0 in | Wt 199.0 lb

## 2014-01-30 DIAGNOSIS — M5030 Other cervical disc degeneration, unspecified cervical region: Secondary | ICD-10-CM

## 2014-01-30 DIAGNOSIS — I059 Rheumatic mitral valve disease, unspecified: Secondary | ICD-10-CM

## 2014-01-30 DIAGNOSIS — M25519 Pain in unspecified shoulder: Secondary | ICD-10-CM

## 2014-01-30 DIAGNOSIS — I341 Nonrheumatic mitral (valve) prolapse: Secondary | ICD-10-CM

## 2014-01-30 DIAGNOSIS — E785 Hyperlipidemia, unspecified: Secondary | ICD-10-CM

## 2014-01-30 DIAGNOSIS — I10 Essential (primary) hypertension: Secondary | ICD-10-CM

## 2014-01-30 DIAGNOSIS — M25512 Pain in left shoulder: Secondary | ICD-10-CM

## 2014-01-30 NOTE — Progress Notes (Signed)
Subjective:    Patient ID: [name], female    DOB: [birth year], 53 y.o.   MRN: [id number]  HPI 53 year old African American female, nonsmoker, the patient to the practice, left hand dominant female is in today with complaints of left shoulder pain ongoing x10 years but worsening over the last 8 months. This is the initial Tylenol that helps. The pain tends to be worse with lying down. Describes it as a constant 10 out of 10, dull ache. Denies any known injury. However, had an injury from a fall approximately 10 years ago but is unsure which shoulder she entered. She has a history of hypertension, hyperlipidemia, degenerative disc disease of the cervical spine, mitral valve prolapse and anxiety disorder.   Review of Systems  Constitutional: Negative.   Respiratory: Negative.   Cardiovascular: Negative.   Gastrointestinal: Negative.   Endocrine: Negative.   Genitourinary: Negative.   Musculoskeletal: Positive for arthralgias. Negative for joint swelling.       Left shoulder pain   Skin: Negative.   Neurological: Negative.   Psychiatric/Behavioral: Negative.    Past Medical History  Diagnosis Date  . Hypertension   . Edema   . Pain in limb   . Back pain   . Asthma   . MVP (mitral valve prolapse) 12/22/2011  . S/P dilatation of esophageal stricture 12/22/2011    History   Social History  . Marital Status: Legally Separated    Spouse Name: N/A    Number of Children: 3  . Years of Education: N/A   Occupational History  . office manager    Social History Main Topics  . Smoking status: Never Smoker   . Smokeless tobacco: Never Used  . Alcohol Use: No  . Drug Use: No  . Sexual Activity: Not on file   Other Topics Concern  . Not on file   Social History Narrative  . No narrative on file    Past Surgical History  Procedure Laterality Date  . Abdominal hysterectomy    . Cardiac catheterization    . Tonsillectomy    . Spine surgery      Family History    Problem Relation Age of Onset  . Diabetes Mother   . Hypertension Mother   . Diabetes Father   . Hypertension Father   . Stroke Other   . Coronary artery disease Other     Allergies  Allergen Reactions  . Morphine And Related Other (See Comments)    hallucinations  . Pantoprazole Sodium Other (See Comments)    blisters  . Prednisone Other (See Comments)    Blisters on tongue    Current Outpatient Prescriptions on File Prior to Visit  Medication Sig Dispense Refill  . albuterol (PROVENTIL HFA;VENTOLIN HFA) 108 (90 BASE) MCG/ACT inhaler Inhale 2 puffs into the lungs every 6 (six) hours as needed for wheezing or shortness of breath.  1 Inhaler  3  . aspirin EC 325 MG tablet Take 1 tablet (325 mg total) by mouth daily.  30 tablet  0  . Bioflavonoid Products (VITAMIN C) CHEW Chew by mouth daily.      Marland Kitchen CALCIUM PO Take by mouth.      . chlorpheniramine-HYDROcodone (TUSSIONEX PENNKINETIC ER) 10-8 MG/5ML LQCR Take 5 mLs by mouth every 12 (twelve) hours as needed for cough.  115 mL  0  . Cholecalciferol (VITAMIN D PO) Take by mouth daily.      . Enalapril-Hydrochlorothiazide 5-12.5 MG per tablet Take 1 tablet by  mouth daily.  30 tablet  5  . furosemide (LASIX) 20 MG tablet TAKE ONE TABLET BY MOUTH ONCE DAILY AS NEEDED FOR  SWELLING  30 tablet  2  . Multiple Vitamin (MULITIVITAMIN WITH MINERALS) TABS Take 1 tablet by mouth daily.      . Nutritional Supplements (EQ ESTROBLEND MENOPAUSE PO) Take 1 tablet by mouth daily.      . Pseudoeph-Doxylamine-DM-APAP (NYQUIL PO) Take 30 mLs by mouth at bedtime.       No current facility-administered medications on file prior to visit.    BP 124/84  Temp(Src) 98.2 F (36.8 C) (Oral)  Ht 5\' 7"  (1.702 m)  Wt 199 lb (90.266 kg)  BMI 31.16 kg/m2chart and    Objective:   Physical Exam  Constitutional: She is oriented to person, place, and time. She appears well-developed and well-nourished.  Neck: Normal range of motion.  Cardiovascular: Normal  rate, regular rhythm and normal heart sounds.   Pulmonary/Chest: Effort normal and breath sounds normal.  Musculoskeletal: She exhibits tenderness.  Left shoulder: Tender to palpation at the rotator cuff. Decreased ROM due to pain.   Neurological: She is alert and oriented to person, place, and time.  Skin: Skin is warm and dry.  Psychiatric: She has a normal mood and affect.          Assessment & Plan:  [name] was seen today for establish care.  Diagnoses and associated orders for this visit:  Unspecified essential hypertension  Other and unspecified hyperlipidemia  DDD (degenerative disc disease), cervical  MVP (mitral valve prolapse)  Left shoulder pain - DG Shoulder Left; Future

## 2014-01-30 NOTE — Progress Notes (Signed)
Pre visit review using our clinic review tool, if applicable. No additional management support is needed unless otherwise documented below in the visit note. 

## 2014-01-30 NOTE — Telephone Encounter (Signed)
Relevant patient education assigned to patient using Emmi. ° °

## 2014-01-30 NOTE — Patient Instructions (Signed)

## 2014-02-08 ENCOUNTER — Ambulatory Visit (INDEPENDENT_AMBULATORY_CARE_PROVIDER_SITE_OTHER)
Admission: RE | Admit: 2014-02-08 | Discharge: 2014-02-08 | Disposition: A | Discharge status: Home / Self Care | Payer: BC Managed Care – PPO | Source: Ambulatory Visit | Attending: Family | Admitting: Family

## 2014-02-08 DIAGNOSIS — M25512 Pain in left shoulder: Secondary | ICD-10-CM

## 2014-02-08 DIAGNOSIS — M25519 Pain in unspecified shoulder: Secondary | ICD-10-CM

## 2014-02-09 ENCOUNTER — Encounter (HOSPITAL_COMMUNITY): Payer: Self-pay | Admitting: Emergency Medicine

## 2014-02-09 ENCOUNTER — Telehealth: Payer: Self-pay | Admitting: Family

## 2014-02-09 ENCOUNTER — Emergency Department (HOSPITAL_COMMUNITY)
Admission: EM | Admit: 2014-02-09 | Discharge: 2014-02-09 | Disposition: A | Discharge status: Home / Self Care | Payer: BC Managed Care – PPO | Attending: Emergency Medicine | Admitting: Emergency Medicine

## 2014-02-09 ENCOUNTER — Emergency Department (HOSPITAL_COMMUNITY): Payer: BC Managed Care – PPO

## 2014-02-09 DIAGNOSIS — I10 Essential (primary) hypertension: Secondary | ICD-10-CM | POA: Insufficient documentation

## 2014-02-09 DIAGNOSIS — R42 Dizziness and giddiness: Secondary | ICD-10-CM | POA: Insufficient documentation

## 2014-02-09 DIAGNOSIS — R079 Chest pain, unspecified: Secondary | ICD-10-CM

## 2014-02-09 DIAGNOSIS — K219 Gastro-esophageal reflux disease without esophagitis: Secondary | ICD-10-CM | POA: Insufficient documentation

## 2014-02-09 DIAGNOSIS — Z7982 Long term (current) use of aspirin: Secondary | ICD-10-CM | POA: Insufficient documentation

## 2014-02-09 DIAGNOSIS — Z79899 Other long term (current) drug therapy: Secondary | ICD-10-CM | POA: Insufficient documentation

## 2014-02-09 DIAGNOSIS — R61 Generalized hyperhidrosis: Secondary | ICD-10-CM | POA: Insufficient documentation

## 2014-02-09 DIAGNOSIS — J45901 Unspecified asthma with (acute) exacerbation: Secondary | ICD-10-CM | POA: Insufficient documentation

## 2014-02-09 DIAGNOSIS — Z9889 Other specified postprocedural states: Secondary | ICD-10-CM | POA: Insufficient documentation

## 2014-02-09 DIAGNOSIS — IMO0001 Reserved for inherently not codable concepts without codable children: Secondary | ICD-10-CM

## 2014-02-09 LAB — BASIC METABOLIC PANEL
BUN: 10 mg/dL (ref 6–23)
CO2: 25 meq/L (ref 19–32)
Calcium: 9.8 mg/dL (ref 8.4–10.5)
Chloride: 100 mEq/L (ref 96–112)
Creatinine, Ser: 0.8 mg/dL (ref 0.50–1.10)
GFR calc Af Amer: >90 mL/min (ref >90)
GFR calc non Af Amer: 83 mL/min — ABNORMAL LOW (ref >90)
GLUCOSE: 89 mg/dL (ref 70–99)
POTASSIUM: 3.7 meq/L (ref 3.7–5.3)
Sodium: 140 mEq/L (ref 137–147)

## 2014-02-09 LAB — I-STAT TROPONIN, ED
TROPONIN I, POC: 0.01 ng/mL (ref 0.00–0.08)
Troponin i, poc: 0 ng/mL (ref 0.00–0.08)

## 2014-02-09 LAB — CBC
HEMATOCRIT: 35.9 % — AB (ref 36.0–46.0)
HEMOGLOBIN: 12.4 g/dL (ref 12.0–15.0)
MCH: 30.7 pg (ref 26.0–34.0)
MCHC: 34.5 g/dL (ref 30.0–36.0)
MCV: 88.9 fL (ref 78.0–100.0)
Platelets: 169 10*3/uL (ref 150–400)
RBC: 4.04 MIL/uL (ref 3.87–5.11)
RDW: 12.3 % (ref 11.5–15.5)
WBC: 6 10*3/uL (ref 4.0–10.5)

## 2014-02-09 LAB — PRO B NATRIURETIC PEPTIDE: Pro B Natriuretic peptide (BNP): 45.1 pg/mL (ref 0–125)

## 2014-02-09 MED ORDER — OMEPRAZOLE 20 MG PO CPDR: 20.0000 mg | DELAYED_RELEASE_CAPSULE | Freq: Every day | ORAL | Status: DC

## 2014-02-09 MED ORDER — ASPIRIN 81 MG PO CHEW
324.0000 mg | CHEWABLE_TABLET | Freq: Once | ORAL | Status: AC
  Administered 2014-02-09: 324 mg via ORAL
  Filled 2014-02-09: qty 4

## 2014-02-09 MED ORDER — NITROGLYCERIN 0.4 MG SL SUBL: 0.4000 mg | SUBLINGUAL_TABLET | SUBLINGUAL | Status: DC | PRN

## 2014-02-09 MED ORDER — IOHEXOL 350 MG/ML SOLN
100.0000 mL | Freq: Once | INTRAVENOUS | Status: AC | PRN
  Administered 2014-02-09: 100 mL via INTRAVENOUS

## 2014-02-09 NOTE — ED Notes (Signed)
Pt c/o central chest pain, shob and dizziness that started today when she was leaving school today. Pt states that she has been to many doctors about her left should and swelling in her left leg but no one can tell her what is wrong with her. Pt states the chest pain feels like a "bone is stuck inside of me".

## 2014-02-09 NOTE — ED Notes (Signed)
Pt out of room for CT 

## 2014-02-09 NOTE — Telephone Encounter (Signed)
See results note. 

## 2014-02-09 NOTE — Telephone Encounter (Signed)
Pt would like to know the results to her X-Ray.

## 2014-02-09 NOTE — ED Provider Notes (Signed)
  Physical Exam  BP 140/108  Pulse 65  Temp(Src) 98.9 F (37.2 C) (Oral)  Resp 16  SpO2 96%  Physical Exam  ED Course  Procedures  Care assumed at sign out from Dr. Lynn Ito. Patient has chest pain, sob today. CT angio showed no PE but possible esophagitis. Sign out pending second trop. Second trop neg. I think symptoms from GERD. Recommend prilosec and GI f/u.      Wandra Arthurs, MD 02/09/14 [phone]

## 2014-02-09 NOTE — Discharge Instructions (Signed)
Chest Pain (Nonspecific) °It is often hard to give a specific diagnosis for the cause of chest pain. There is always a chance that your pain could be related to something serious, such as a heart attack or a blood clot in the lungs. You need to follow up with your caregiver for further evaluation. °CAUSES  °· Heartburn. °· Pneumonia or bronchitis. °· Anxiety or stress. °· Inflammation around your heart (pericarditis) or lung (pleuritis or pleurisy). °· A blood clot in the lung. °· A collapsed lung (pneumothorax). It can develop suddenly on its own (spontaneous pneumothorax) or from injury (trauma) to the chest. °· Shingles infection (herpes zoster virus). °The chest wall is composed of bones, muscles, and cartilage. Any of these can be the source of the pain. °· The bones can be bruised by injury. °· The muscles or cartilage can be strained by coughing or overwork. °· The cartilage can be affected by inflammation and become sore (costochondritis). °DIAGNOSIS  °Lab tests or other studies, such as X-rays, electrocardiography, stress testing, or cardiac imaging, may be needed to find the cause of your pain.  °TREATMENT  °· Treatment depends on what may be causing your chest pain. Treatment may include: °· Acid blockers for heartburn. °· Anti-inflammatory medicine. °· Pain medicine for inflammatory conditions. °· Antibiotics if an infection is present. °· You may be advised to change lifestyle habits. This includes stopping smoking and avoiding alcohol, caffeine, and chocolate. °· You may be advised to keep your head raised (elevated) when sleeping. This reduces the chance of acid going backward from your stomach into your esophagus. °· Most of the time, nonspecific chest pain will improve within 2 to 3 days with rest and mild pain medicine. °HOME CARE INSTRUCTIONS  °· If antibiotics were prescribed, take your antibiotics as directed. Finish them even if you start to feel better. °· For the next few days, avoid physical  activities that bring on chest pain. Continue physical activities as directed. °· Do not smoke. °· Avoid drinking alcohol. °· Only take over-the-counter or prescription medicine for pain, discomfort, or fever as directed by your caregiver. °· Follow your caregiver's suggestions for further testing if your chest pain does not go away. °· Keep any follow-up appointments you made. If you do not go to an appointment, you could develop lasting (chronic) problems with pain. If there is any problem keeping an appointment, you must call to reschedule. °SEEK MEDICAL CARE IF:  °· You think you are having problems from the medicine you are taking. Read your medicine instructions carefully. °· Your chest pain does not go away, even after treatment. °· You develop a rash with blisters on your chest. °SEEK IMMEDIATE MEDICAL CARE IF:  °· You have increased chest pain or pain that spreads to your arm, neck, jaw, back, or abdomen. °· You develop shortness of breath, an increasing cough, or you are coughing up blood. °· You have severe back or abdominal pain, feel nauseous, or vomit. °· You develop severe weakness, fainting, or chills. °· You have a fever. °THIS IS AN EMERGENCY. Do not wait to see if the pain will go away. Get medical help at once. Call your local emergency services (911 in [place] Do not drive yourself to the hospital. °MAKE SURE YOU:  °· Understand these instructions. °· Will watch your condition. °· Will get help right away if you are not doing well or get worse. °Document Released: 07/29/2005 Document Revised: 01/11/2012 Document Reviewed: 05/24/2008 °[employer]® Patient Information ©2014 [employer]   [employer].    Take prilosec 20 mg daily.   Follow up with your doctor. You may need to see a GI doctor.   Return to ER if you have severe chest pain, shortness of breath.

## 2014-02-09 NOTE — ED Provider Notes (Signed)
CSN: [id number]     Arrival date & time 02/09/14  1237 History   First MD Initiated Contact with Patient 02/09/14 1252     Chief Complaint  Patient presents with  . Chest Pain  . Shortness of Breath  . Dizziness     (Consider location/radiation/quality/duration/timing/severity/associated sxs/prior Treatment) Patient is a 53 y.o. female presenting with chest pain.  Chest Pain Pain location:  Substernal area Pain quality: sharp   Pain radiates to:  Does not radiate Pain severity:  Moderate Onset quality:  Sudden Duration:  1 hour Timing:  Constant Progression:  Unchanged Chronicity:  New Context comment:  During class Relieved by:  Nothing Associated symptoms: diaphoresis, dizziness and shortness of breath   Associated symptoms: no fever and no nausea     Past Medical History  Diagnosis Date  . Hypertension   . Edema   . Pain in limb   . Back pain   . Asthma   . MVP (mitral valve prolapse) 12/22/2011  . S/P dilatation of esophageal stricture 12/22/2011   Past Surgical History  Procedure Laterality Date  . Abdominal hysterectomy    . Cardiac catheterization    . Tonsillectomy    . Spine surgery     Family History  Problem Relation Age of Onset  . Diabetes Mother   . Hypertension Mother   . Diabetes Father   . Hypertension Father   . Stroke Other   . Coronary artery disease Other    History  Substance Use Topics  . Smoking status: Never Smoker   . Smokeless tobacco: Never Used  . Alcohol Use: No   OB History   Grav Para Term Preterm Abortions TAB SAB Ect Mult Living                 Review of Systems  Constitutional: Positive for diaphoresis. Negative for fever.  Respiratory: Positive for shortness of breath.   Cardiovascular: Positive for chest pain.  Gastrointestinal: Negative for nausea.  Neurological: Positive for dizziness.  All other systems reviewed and are negative.     Allergies  Morphine and related; Pantoprazole sodium; and  Prednisone  Home Medications   Current Outpatient Rx  Name  Route  Sig  Dispense  Refill  . albuterol (PROVENTIL HFA;VENTOLIN HFA) 108 (90 BASE) MCG/ACT inhaler   Inhalation   Inhale 2 puffs into the lungs every 6 (six) hours as needed for wheezing or shortness of breath.   1 Inhaler   3   . aspirin EC 325 MG tablet   Oral   Take 1 tablet (325 mg total) by mouth daily.   30 tablet   0   . Bioflavonoid Products (VITAMIN C) CHEW   Oral   Chew by mouth daily.         Marland Kitchen CALCIUM PO   Oral   Take by mouth.         . Cholecalciferol (VITAMIN D PO)   Oral   Take by mouth daily.         . Enalapril-Hydrochlorothiazide 5-12.5 MG per tablet   Oral   Take 1 tablet by mouth daily.   30 tablet   5   . furosemide (LASIX) 20 MG tablet      TAKE ONE TABLET BY MOUTH ONCE DAILY AS NEEDED FOR  SWELLING   30 tablet   2   . Multiple Vitamin (MULITIVITAMIN WITH MINERALS) TABS   Oral   Take 1 tablet by mouth daily.         Marland Kitchen  OVER THE COUNTER MEDICATION   Oral   Take 1 tablet by mouth as needed. Appetite suppressant         . tretinoin (RETIN-A) 0.05 % cream   Topical   Apply 1 application topically at bedtime.          BP 140/108  Pulse 65  Temp(Src) 98.9 F (37.2 C) (Oral)  Resp 16  SpO2 96% Physical Exam  Nursing note and vitals reviewed. Constitutional: She is oriented to person, place, and time. She appears well-developed and well-nourished. No distress.  HENT:  Head: Normocephalic and atraumatic.  Mouth/Throat: Oropharynx is clear and moist.  Eyes: Conjunctivae are normal. Pupils are equal, round, and reactive to light. No scleral icterus.  Neck: Neck supple.  Cardiovascular: Normal rate, regular rhythm, normal heart sounds and intact distal pulses.   No murmur heard. Pulmonary/Chest: Effort normal and breath sounds normal. No stridor. No respiratory distress. She has no rales. She exhibits tenderness.  Abdominal: Soft. Bowel sounds are normal. She  exhibits no distension. There is no tenderness.  Musculoskeletal: Normal range of motion.  Left leg in compression hose.  No significant edema when hose removed.    Neurological: She is alert and oriented to person, place, and time.  Skin: Skin is warm and dry. No rash noted.  Psychiatric: She has a normal mood and affect. Her behavior is normal.    ED Course  Procedures (including critical care time) Labs Review Labs Reviewed  CBC - Abnormal; Notable for the following:    HCT 35.9 (*)    All other components within normal limits  BASIC METABOLIC PANEL - Abnormal; Notable for the following:    GFR calc non Af Amer 83 (*)    All other components within normal limits  PRO B NATRIURETIC PEPTIDE  I-STAT TROPOININ, ED   Imaging Review Ct Angio Chest Pe W/cm &/or Wo Cm  02/09/2014   CLINICAL DATA:  Shortness of breath and chest pain  EXAM: CT ANGIOGRAPHY CHEST WITH CONTRAST  TECHNIQUE: Multidetector CT imaging of the chest was performed using the standard protocol during bolus administration of intravenous contrast. Multiplanar CT image reconstructions and MIPs were obtained to evaluate the vascular anatomy.  CONTRAST:  150mL OMNIPAQUE IOHEXOL 350 MG/ML SOLN  COMPARISON:  DG CHEST 2 VIEW dated 11/14/2013; CT ANGIO CHEST W/CM &/OR WO/CM dated 01/10/2012  FINDINGS: Contrast within the pulmonary arterial tree is normal in appearance. There are no filling defects to suggest an acute pulmonary embolism. The caliber of the thoracic aorta is normal. There is no evidence of a false lumen. The cardiac chambers are top normal in size. There is no pleural nor pericardial effusion. The caliber of the thoracic esophagus is normal. There is a small hiatal hernia. There is no mediastinal or hilar lymphadenopathy. There is a moderate amount of dense breast parenchyma present that is fairly symmetric from right to left. There is no axillary lymphadenopathy.  At lung window settings there is no interstitial or alveolar  infiltrate. There is minimal pleural based atelectasis or scarring in the posterior costophrenic gutter on the left.  The observed portions of the liver and spleen and adrenal glands exhibit no acute abnormalities.  The thoracic spine and sternum appear intact. The observed portions of the ribs are normal in appearance.  Review of the MIP images confirms the above findings.  IMPRESSION: 1. There is no evidence of an acute pulmonary embolism. No acute thoracic aortic pathology is demonstrated either. 2. There is top-normal cardiac  chamber size without evidence of CHF nor pleural nor pericardial effusion. 3. There is no evidence of pneumonia nor pneumothorax or pneumomediastinum. 4. There is a small thick-walled hiatal hernia. Esophagitis may be present.   Electronically Signed   By: David  Martinique   On: 02/09/2014 15:40   Dg Shoulder Left  02/08/2014   CLINICAL DATA:  Chronic left shoulder pain, worse in the last 6 months  EXAM: LEFT SHOULDER - 2+ VIEW  COMPARISON:  DG SHOULDER *L* dated 12/05/2010  FINDINGS: Stable very mild acromioclavicular joint hypertrophy. No glenohumeral abnormalities.  IMPRESSION: No acute finding   Electronically Signed   By: Skipper Cliche M.D.   On: 02/08/2014 13:16  CT independently viewed by me.      EKG Interpretation   Date/Time:  Friday February 09 2014 12:44:17 EDT Ventricular Rate:  68 PR Interval:  149 QRS Duration: 95 QT Interval:  404 QTC Calculation: 430 R Axis:   -2 Text Interpretation:  Sinus rhythm RSR' in V1 or V2, probably normal  variant No significant change was found Confirmed by [facility]  MD, TREY  ([id number]) on 02/09/2014 3:34:08 PM      MDM   Final diagnoses:  Chest pain  Reflux    53 yo female presenting with chest pain.  History atypical and exam most consistent with MSK pain.  She has a history of leg swelling, which has been worked up in the past.  Vascular notes report some imaging that may represent an old DVT, but no imaging shows acute DVT.   With this history, she will need CTA to rule out PE.  If CT negative, would obtain delta troponin to rule out MI.    CT negative for PE. Shows small hiatal hernia.  Will give GI cocktail. Delta troponin pending. Plan discharge if this is negative.  Houston Siren III, MD 02/10/14 [phone]

## 2014-02-13 ENCOUNTER — Encounter: Payer: BC Managed Care – PPO | Admitting: Family

## 2014-02-16 ENCOUNTER — Telehealth: Payer: Self-pay | Admitting: Family

## 2014-02-16 NOTE — Telephone Encounter (Signed)
Pt would like to know if you received the results of her lupas test and another test and would like to know the results?

## 2014-02-17 NOTE — Telephone Encounter (Signed)
Discuss at [place] on 02/21/14

## 2014-02-21 ENCOUNTER — Encounter: Payer: BC Managed Care – PPO | Admitting: Family

## 2014-02-21 DIAGNOSIS — Z0289 Encounter for other administrative examinations: Secondary | ICD-10-CM

## 2014-03-09 ENCOUNTER — Other Ambulatory Visit: Payer: Self-pay | Admitting: Family

## 2014-03-09 DIAGNOSIS — R768 Other specified abnormal immunological findings in serum: Secondary | ICD-10-CM

## 2014-05-06 ENCOUNTER — Emergency Department (HOSPITAL_BASED_OUTPATIENT_CLINIC_OR_DEPARTMENT_OTHER)
Admission: EM | Admit: 2014-05-06 | Discharge: 2014-05-06 | Disposition: A | Discharge status: Home / Self Care | Payer: BC Managed Care – PPO | Attending: Emergency Medicine | Admitting: Emergency Medicine

## 2014-05-06 ENCOUNTER — Encounter (HOSPITAL_BASED_OUTPATIENT_CLINIC_OR_DEPARTMENT_OTHER): Payer: Self-pay | Admitting: Emergency Medicine

## 2014-05-06 DIAGNOSIS — Y929 Unspecified place or not applicable: Secondary | ICD-10-CM | POA: Insufficient documentation

## 2014-05-06 DIAGNOSIS — W57XXXA Bitten or stung by nonvenomous insect and other nonvenomous arthropods, initial encounter: Secondary | ICD-10-CM

## 2014-05-06 DIAGNOSIS — L03116 Cellulitis of left lower limb: Secondary | ICD-10-CM

## 2014-05-06 DIAGNOSIS — L02419 Cutaneous abscess of limb, unspecified: Secondary | ICD-10-CM | POA: Insufficient documentation

## 2014-05-06 DIAGNOSIS — L03119 Cellulitis of unspecified part of limb: Principal | ICD-10-CM

## 2014-05-06 DIAGNOSIS — Z9889 Other specified postprocedural states: Secondary | ICD-10-CM | POA: Insufficient documentation

## 2014-05-06 DIAGNOSIS — J45909 Unspecified asthma, uncomplicated: Secondary | ICD-10-CM | POA: Insufficient documentation

## 2014-05-06 DIAGNOSIS — Y939 Activity, unspecified: Secondary | ICD-10-CM | POA: Insufficient documentation

## 2014-05-06 DIAGNOSIS — Z79899 Other long term (current) drug therapy: Secondary | ICD-10-CM | POA: Insufficient documentation

## 2014-05-06 DIAGNOSIS — I10 Essential (primary) hypertension: Secondary | ICD-10-CM | POA: Insufficient documentation

## 2014-05-06 DIAGNOSIS — Z7982 Long term (current) use of aspirin: Secondary | ICD-10-CM | POA: Insufficient documentation

## 2014-05-06 MED ORDER — DIPHENHYDRAMINE HCL 25 MG PO CAPS
50.0000 mg | ORAL_CAPSULE | Freq: Once | ORAL | Status: AC
  Administered 2014-05-06: 50 mg via ORAL
  Filled 2014-05-06: qty 2

## 2014-05-06 MED ORDER — CEPHALEXIN 500 MG PO CAPS: 500.0000 mg | ORAL_CAPSULE | Freq: Three times a day (TID) | ORAL | Status: DC

## 2014-05-06 MED ORDER — CEPHALEXIN 250 MG PO CAPS
250.0000 mg | ORAL_CAPSULE | Freq: Once | ORAL | Status: AC
  Administered 2014-05-06: 250 mg via ORAL
  Filled 2014-05-06: qty 1

## 2014-05-06 NOTE — ED Notes (Signed)
Insect bite to left leg on Thursday  itching

## 2014-05-06 NOTE — ED Notes (Addendum)
Pt thinks she has bitten by something on Thursday.Denies drainage from wound. Circular area pink in color to left posterior thigh with open area in middle of wound. 4CM in width and 5CM in length. States area has been tender. Denies fevers.

## 2014-05-06 NOTE — ED Provider Notes (Signed)
CSN: [id number]     Arrival date & time 05/06/14  0021 History   First MD Initiated Contact with Patient 05/06/14 0034     Chief Complaint  Patient presents with  . ? bite      (Consider location/radiation/quality/duration/timing/severity/associated sxs/prior Treatment) HPI 53 y.o female with red itchy areea posterior area left thigh where she thinks she was stung two days ago.  She did not see it but felt a sudden irritation.  She has not had fever and has not noted discharge.  No cough, dyspnea or other complaints.   Past Medical History  Diagnosis Date  . Hypertension   . Edema   . Pain in limb   . Back pain   . Asthma   . MVP (mitral valve prolapse) 12/22/2011  . S/P dilatation of esophageal stricture 12/22/2011   Past Surgical History  Procedure Laterality Date  . Abdominal hysterectomy    . Cardiac catheterization    . Tonsillectomy    . Spine surgery     Family History  Problem Relation Age of Onset  . Diabetes Mother   . Hypertension Mother   . Diabetes Father   . Hypertension Father   . Stroke Other   . Coronary artery disease Other    History  Substance Use Topics  . Smoking status: Never Smoker   . Smokeless tobacco: Never Used  . Alcohol Use: No   OB History   Grav Para Term Preterm Abortions TAB SAB Ect Mult Living                 Review of Systems  All other systems reviewed and are negative.     Allergies  Morphine and related; Pantoprazole sodium; and Prednisone  Home Medications   Prior to Admission medications   Medication Sig Start Date End Date Taking? Authorizing Provider  albuterol (PROVENTIL HFA;VENTOLIN HFA) 108 (90 BASE) MCG/ACT inhaler Inhale 2 puffs into the lungs every 6 (six) hours as needed for wheezing or shortness of breath. 10/12/13   Barton Fanny, MD  aspirin EC 325 MG tablet Take 1 tablet (325 mg total) by mouth daily. 04/06/13   Varney Biles, MD  Bioflavonoid Products (VITAMIN C) CHEW Chew by mouth daily.     Historical Provider, MD  CALCIUM PO Take by mouth.    Historical Provider, MD  Cholecalciferol (VITAMIN D PO) Take by mouth daily.    Historical Provider, MD  Enalapril-Hydrochlorothiazide 5-12.5 MG per tablet Take 1 tablet by mouth daily. 10/12/13   Barton Fanny, MD  furosemide (LASIX) 20 MG tablet TAKE ONE TABLET BY MOUTH ONCE DAILY AS NEEDED FOR  SWELLING 11/08/13   Barton Fanny, MD  Multiple Vitamin (MULITIVITAMIN WITH MINERALS) TABS Take 1 tablet by mouth daily.    Historical Provider, MD  omeprazole (PRILOSEC) 20 MG capsule Take 1 capsule (20 mg total) by mouth daily. 02/09/14   Wandra Arthurs, MD  OVER THE COUNTER MEDICATION Take 1 tablet by mouth as needed. Appetite suppressant    Historical Provider, MD  tretinoin (RETIN-A) 0.05 % cream Apply 1 application topically at bedtime.    Historical Provider, MD   Temp(Src) 97.8 F (36.6 C) (Oral) Physical Exam  Nursing note and vitals reviewed. Constitutional: She appears well-developed and well-nourished.  HENT:  Head: Normocephalic and atraumatic.  Right Ear: External ear normal.  Left Ear: External ear normal.  Mouth/Throat: Oropharynx is clear and moist.  Eyes: Conjunctivae and EOM are normal. Pupils are equal, round,  and reactive to light.  Neck: Normal range of motion. Neck supple.  Cardiovascular: Normal rate, regular rhythm, normal heart sounds and intact distal pulses.   Pulmonary/Chest: Effort normal and breath sounds normal.  Abdominal: Soft. Bowel sounds are normal.  Musculoskeletal:       Legs:   ED Course  Procedures (including critical care time) Labs Review Labs Reviewed - No data to display  Imaging Review No results found.   EKG Interpretation None      MDM   Final diagnoses:  Insect bite  Cellulitis of left lower extremity   Red indurated area with middle excoriation consistent with bug bite but may have secondary infection- no fluctuance notes.  Plan benadryl and keflex.  Patient voiced  understanding of need for follow up and return precautions.     Shaune Pollack, MD 05/06/14 [phone]

## 2014-05-06 NOTE — Discharge Instructions (Signed)
Insect Bite Mosquitoes, flies, fleas, bedbugs, and many other insects can bite. Insect bites are different from insect stings. A sting is when venom is injected into the skin. Some insect bites can transmit infectious diseases. SYMPTOMS  Insect bites usually turn red, swell, and itch for 2 to 4 days. They often go away on their own. TREATMENT  Your caregiver may prescribe antibiotic medicines if a bacterial infection develops in the bite. HOME CARE INSTRUCTIONS  Do not scratch the bite area.  Keep the bite area clean and dry. Wash the bite area thoroughly with soap and water.  Put ice or cool compresses on the bite area.  Put ice in a plastic bag.  Place a towel between your skin and the bag.  Leave the ice on for 20 minutes, 4 times a day for the first 2 to 3 days, or as directed.  You may apply a baking soda paste, cortisone cream, or calamine lotion to the bite area as directed by your caregiver. This can help reduce itching and swelling.  Only take over-the-counter or prescription medicines as directed by your caregiver.  If you are given antibiotics, take them as directed. Finish them even if you start to feel better. You may need a tetanus shot if:  You cannot remember when you had your last tetanus shot.  You have never had a tetanus shot.  The injury broke your skin. If you get a tetanus shot, your arm may swell, get red, and feel warm to the touch. This is common and not a problem. If you need a tetanus shot and you choose not to have one, there is a rare chance of getting tetanus. Sickness from tetanus can be serious. SEEK IMMEDIATE MEDICAL CARE IF:   You have increased pain, redness, or swelling in the bite area.  You see a red line on the skin coming from the bite.  You have a fever.  You have joint pain.  You have a headache or neck pain.  You have unusual weakness.  You have a rash.  You have chest pain or shortness of breath.  You have abdominal pain,  nausea, or vomiting.  You feel unusually tired or sleepy. MAKE SURE YOU:   Understand these instructions.  Will watch your condition.  Will get help right away if you are not doing well or get worse. Document Released: 11/26/2004 Document Revised: 01/11/2012 Document Reviewed: 05/20/2011 [facility] Patient Information 2015 [place], [place]. This information is not intended to replace advice given to you by your health care provider. Make sure you discuss any questions you have with your health care provider. Cellulitis Cellulitis is an infection of the skin and the tissue beneath it. The infected area is usually red and tender. Cellulitis occurs most often in the arms and lower legs.  CAUSES  Cellulitis is caused by bacteria that enter the skin through cracks or cuts in the skin. The most common types of bacteria that cause cellulitis are Staphylococcus and Streptococcus. SYMPTOMS   Redness and warmth.  Swelling.  Tenderness or pain.  Fever. DIAGNOSIS  Your caregiver can usually determine what is wrong based on a physical exam. Blood tests may also be done. TREATMENT  Treatment usually involves taking an antibiotic medicine. HOME CARE INSTRUCTIONS   Take your antibiotics as directed. Finish them even if you start to feel better.  Keep the infected arm or leg elevated to reduce swelling.  Apply a warm cloth to the affected area up to 4 times  per day to relieve pain.  Only take over-the-counter or prescription medicines for pain, discomfort, or fever as directed by your caregiver.  Keep all follow-up appointments as directed by your caregiver. SEEK MEDICAL CARE IF:   You notice red streaks coming from the infected area.  Your red area gets larger or turns dark in color.  Your bone or joint underneath the infected area becomes painful after the skin has healed.  Your infection returns in the same area or another area.  You notice a swollen bump in the infected area.  You  develop new symptoms. SEEK IMMEDIATE MEDICAL CARE IF:   You have a fever.  You feel very sleepy.  You develop vomiting or diarrhea.  You have a general ill feeling (malaise) with muscle aches and pains. MAKE SURE YOU:   Understand these instructions.  Will watch your condition.  Will get help right away if you are not doing well or get worse. Document Released: 07/29/2005 Document Revised: 04/19/2012 Document Reviewed: 01/04/2012 [facility] Patient Information 2015 [place], [place]. This information is not intended to replace advice given to you by your health care provider. Make sure you discuss any questions you have with your health care provider.

## 2014-06-04 ENCOUNTER — Emergency Department (HOSPITAL_COMMUNITY): Payer: BC Managed Care – PPO

## 2014-06-04 ENCOUNTER — Emergency Department (HOSPITAL_COMMUNITY)
Admission: EM | Admit: 2014-06-04 | Discharge: 2014-06-05 | Disposition: A | Discharge status: Home / Self Care | Payer: BC Managed Care – PPO | Attending: Emergency Medicine | Admitting: Emergency Medicine

## 2014-06-04 ENCOUNTER — Encounter (HOSPITAL_COMMUNITY): Payer: Self-pay | Admitting: Emergency Medicine

## 2014-06-04 DIAGNOSIS — I10 Essential (primary) hypertension: Secondary | ICD-10-CM | POA: Insufficient documentation

## 2014-06-04 DIAGNOSIS — Z8679 Personal history of other diseases of the circulatory system: Secondary | ICD-10-CM | POA: Insufficient documentation

## 2014-06-04 DIAGNOSIS — Z79899 Other long term (current) drug therapy: Secondary | ICD-10-CM | POA: Insufficient documentation

## 2014-06-04 DIAGNOSIS — R404 Transient alteration of awareness: Secondary | ICD-10-CM | POA: Insufficient documentation

## 2014-06-04 DIAGNOSIS — Z3202 Encounter for pregnancy test, result negative: Secondary | ICD-10-CM | POA: Insufficient documentation

## 2014-06-04 DIAGNOSIS — R55 Syncope and collapse: Secondary | ICD-10-CM | POA: Insufficient documentation

## 2014-06-04 DIAGNOSIS — J45901 Unspecified asthma with (acute) exacerbation: Secondary | ICD-10-CM | POA: Insufficient documentation

## 2014-06-04 DIAGNOSIS — Z7982 Long term (current) use of aspirin: Secondary | ICD-10-CM | POA: Insufficient documentation

## 2014-06-04 LAB — URINALYSIS, ROUTINE W REFLEX MICROSCOPIC
BILIRUBIN URINE: NEGATIVE
GLUCOSE, UA: NEGATIVE mg/dL
HGB URINE DIPSTICK: NEGATIVE
Ketones, ur: NEGATIVE mg/dL
Leukocytes, UA: NEGATIVE
Nitrite: NEGATIVE
Protein, ur: NEGATIVE mg/dL
Specific Gravity, Urine: 1.017 (ref 1.005–1.030)
Urobilinogen, UA: 0.2 mg/dL (ref 0.0–1.0)
pH: 6 (ref 5.0–8.0)

## 2014-06-04 LAB — PRO B NATRIURETIC PEPTIDE: Pro B Natriuretic peptide (BNP): 80.7 pg/mL (ref 0–125)

## 2014-06-04 LAB — BASIC METABOLIC PANEL
Anion gap: 10 (ref 5–15)
BUN: 13 mg/dL (ref 6–23)
CHLORIDE: 102 meq/L (ref 96–112)
CO2: 29 mEq/L (ref 19–32)
Calcium: 8.8 mg/dL (ref 8.4–10.5)
Creatinine, Ser: 0.69 mg/dL (ref 0.50–1.10)
GFR calc Af Amer: >90 mL/min (ref >90)
Glucose, Bld: 79 mg/dL (ref 70–99)
POTASSIUM: 3.7 meq/L (ref 3.7–5.3)
SODIUM: 141 meq/L (ref 137–147)

## 2014-06-04 LAB — CBC WITH DIFFERENTIAL/PLATELET
Basophils Absolute: 0 10*3/uL (ref 0.0–0.1)
Basophils Relative: 1 % (ref 0–1)
EOS ABS: 0.4 10*3/uL (ref 0.0–0.7)
Eosinophils Relative: 6 % — ABNORMAL HIGH (ref 0–5)
HCT: 34.7 % — ABNORMAL LOW (ref 36.0–46.0)
Hemoglobin: 11.7 g/dL — ABNORMAL LOW (ref 12.0–15.0)
LYMPHS ABS: 1.7 10*3/uL (ref 0.7–4.0)
Lymphocytes Relative: 27 % (ref 12–46)
MCH: 30.4 pg (ref 26.0–34.0)
MCHC: 33.7 g/dL (ref 30.0–36.0)
MCV: 90.1 fL (ref 78.0–100.0)
MONOS PCT: 7 % (ref 3–12)
Monocytes Absolute: 0.5 10*3/uL (ref 0.1–1.0)
NEUTROS ABS: 3.8 10*3/uL (ref 1.7–7.7)
Neutrophils Relative %: 59 % (ref 43–77)
Platelets: 182 10*3/uL (ref 150–400)
RBC: 3.85 MIL/uL — ABNORMAL LOW (ref 3.87–5.11)
RDW: 12.5 % (ref 11.5–15.5)
WBC: 6.4 10*3/uL (ref 4.0–10.5)

## 2014-06-04 LAB — RAPID URINE DRUG SCREEN, HOSP PERFORMED
Amphetamines: NOT DETECTED
Barbiturates: NOT DETECTED
Benzodiazepines: NOT DETECTED
COCAINE: NOT DETECTED
OPIATES: NOT DETECTED
Tetrahydrocannabinol: NOT DETECTED

## 2014-06-04 LAB — POC URINE PREG, ED: PREG TEST UR: NEGATIVE

## 2014-06-04 LAB — I-STAT TROPONIN, ED: TROPONIN I, POC: 0 ng/mL (ref 0.00–0.08)

## 2014-06-04 MED ORDER — SODIUM CHLORIDE 0.9 % IV BOLUS (SEPSIS)
1000.0000 mL | Freq: Once | INTRAVENOUS | Status: AC
  Administered 2014-06-04: 1000 mL via INTRAVENOUS

## 2014-06-04 MED ORDER — IOHEXOL 350 MG/ML SOLN
80.0000 mL | Freq: Once | INTRAVENOUS | Status: AC | PRN
  Administered 2014-06-04: 80 mL via INTRAVENOUS

## 2014-06-04 MED ORDER — LORAZEPAM 2 MG/ML IJ SOLN
1.0000 mg | Freq: Once | INTRAMUSCULAR | Status: AC
Start: 2014-06-04 — End: 2014-06-04
  Administered 2014-06-04: 1 mg via INTRAVENOUS
  Filled 2014-06-04: qty 1

## 2014-06-04 NOTE — ED Provider Notes (Signed)
CSN: [id number]     Arrival date & time 06/04/14  1532 History   First MD Initiated Contact with Patient 06/04/14 1537     Chief Complaint  Patient presents with  . Loss of Consciousness     (Consider location/radiation/quality/duration/timing/severity/associated sxs/prior Treatment) Patient is a 53 y.o. female presenting with syncope. The history is provided by the patient.  Loss of Consciousness Episode history:  Single Most recent episode:  Today Duration:  3 minutes Timing:  Constant Progression:  Unchanged Chronicity:  New Context: inactivity   Witnessed: yes   Relieved by:  Nothing Worsened by:  Nothing tried Ineffective treatments:  None tried Associated symptoms: dizziness, headaches and shortness of breath   Associated symptoms: no chest pain, no fever, no nausea, no palpitations, no seizures, no vomiting and no weakness   Risk factors: no coronary artery disease and no seizures     53 y.o. female with a chief complaint of syncope. Patient was at work sitting at was talking to a coworker and suddenly blacked out. Patient does [place] he can happen. Per witnesses she went completely out for 2-3 minutes had some myoclonic jerking. Patient when she woke when EMS got there was very combative as well. Patient has been complaining of a headache off and on for the past 3 weeks. Patient thinks this is associated with her hypertension that is not well controlled. Patient does not have a PCP currently and is supposed to have followup on Friday. Patient also complaining of shortness of breath and chest tightness. Patient states is also been going on for the past 2 or 3 weeks. Patient now feels that she can't no more than 6 steps without having to sit and rest. Patient unable to sleep flat in bed due to the recent back surgery. Patient also endorses PND. Last heart cath back 16 years ago. Patient denies any focal weakness. Patient denies any neck stiffness or neck pain.    Past Medical  History  Diagnosis Date  . Hypertension   . Edema   . Pain in limb   . Back pain   . Asthma   . MVP (mitral valve prolapse) 12/22/2011  . S/P dilatation of esophageal stricture 12/22/2011   Past Surgical History  Procedure Laterality Date  . Abdominal hysterectomy    . Cardiac catheterization    . Tonsillectomy    . Spine surgery     Family History  Problem Relation Age of Onset  . Diabetes Mother   . Hypertension Mother   . Diabetes Father   . Hypertension Father   . Stroke Other   . Coronary artery disease Other    History  Substance Use Topics  . Smoking status: Never Smoker   . Smokeless tobacco: Never Used  . Alcohol Use: No   OB History   Grav Para Term Preterm Abortions TAB SAB Ect Mult Living                 Review of Systems  Constitutional: Negative for fever and chills.  HENT: Negative for congestion and rhinorrhea.   Eyes: Negative for redness and visual disturbance.  Respiratory: Positive for chest tightness and shortness of breath. Negative for wheezing.   Cardiovascular: Positive for syncope. Negative for chest pain and palpitations.  Gastrointestinal: Negative for nausea and vomiting.  Genitourinary: Negative for dysuria and urgency.  Musculoskeletal: Negative for arthralgias and myalgias.  Skin: Negative for pallor and wound.  Neurological: Positive for dizziness and headaches. Negative for seizures and  weakness.      Allergies  Morphine and related; Pantoprazole sodium; and Prednisone  Home Medications   Prior to Admission medications   Medication Sig Start Date End Date Taking? Authorizing Provider  albuterol (PROVENTIL HFA;VENTOLIN HFA) 108 (90 BASE) MCG/ACT inhaler Inhale 2 puffs into the lungs every 6 (six) hours as needed for wheezing or shortness of breath. 10/12/13  Yes Barton Fanny, MD  aspirin EC 325 MG tablet Take 1 tablet (325 mg total) by mouth daily. 04/06/13  Yes Varney Biles, MD  Enalapril-Hydrochlorothiazide 5-12.5 MG  per tablet Take 1 tablet by mouth 2 (two) times daily.   Yes Historical Provider, MD  furosemide (LASIX) 20 MG tablet Take 20 mg by mouth daily as needed (for swelling).   Yes Historical Provider, MD  Multiple Vitamin (MULITIVITAMIN WITH MINERALS) TABS Take 1 tablet by mouth daily.   Yes Historical Provider, MD   BP 99/57  Pulse 63  Temp(Src) 98 F (36.7 C) (Oral)  Resp 16  SpO2 96% Physical Exam  Constitutional: She is oriented to person, place, and time. She appears well-developed and well-nourished. No distress.  HENT:  Head: Normocephalic and atraumatic.  Eyes: EOM are normal. Pupils are equal, round, and reactive to light.  Neck: Normal range of motion. Neck supple.  Cardiovascular: Normal rate and regular rhythm.  Exam reveals no gallop and no friction rub.   No murmur heard. Pulmonary/Chest: Effort normal. She has no wheezes. She has no rales.  Abdominal: Soft. She exhibits no distension. There is no tenderness.  Musculoskeletal: She exhibits no edema and no tenderness.  Neurological: She is alert and oriented to person, place, and time. She has normal strength. No cranial nerve deficit or sensory deficit. Coordination normal. GCS eye subscore is 4. GCS verbal subscore is 5. GCS motor subscore is 6. She displays no Babinski's sign on the right side. She displays no Babinski's sign on the left side.  Reflex Scores:      Tricep reflexes are 2+ on the right side and 2+ on the left side.      Bicep reflexes are 2+ on the right side and 2+ on the left side.      Brachioradialis reflexes are 2+ on the right side and 2+ on the left side.      Patellar reflexes are 2+ on the right side and 2+ on the left side.      Achilles reflexes are 2+ on the right side and 2+ on the left side. Skin: Skin is warm and dry. She is not diaphoretic.  Psychiatric: She has a normal mood and affect. Her behavior is normal.    ED Course  Procedures (including critical care time) Labs Review Labs  Reviewed  CBC WITH DIFFERENTIAL - Abnormal; Notable for the following:    RBC 3.85 (*)    Hemoglobin 11.7 (*)    HCT 34.7 (*)    Eosinophils Relative 6 (*)    All other components within normal limits  BASIC METABOLIC PANEL  URINE RAPID DRUG SCREEN (HOSP PERFORMED)  URINALYSIS, ROUTINE W REFLEX MICROSCOPIC  PRO B NATRIURETIC PEPTIDE  POC URINE PREG, ED  I-STAT TROPOININ, ED    Imaging Review Ct Head Wo Contrast  06/04/2014   CLINICAL DATA:  Loss of consciousness.  Headache for 1 week.  EXAM: CT HEAD WITHOUT CONTRAST  TECHNIQUE: Contiguous axial images were obtained from the base of the skull through the vertex without intravenous contrast.  COMPARISON:  02/05/2011  FINDINGS: Ventricle size  is normal. Negative for acute or chronic infarction. Negative for hemorrhage or fluid collection. Negative for mass or edema. No shift of the midline structures.  Calvarium is intact.  IMPRESSION: Normal   Electronically Signed   By: Franchot Gallo M.D.   On: 06/04/2014 19:41   Ct Angio Chest Pe W/cm &/or Wo Cm  06/04/2014   CLINICAL DATA:  Shortness of breath.  Rule out pulmonary embolism.  EXAM: CT ANGIOGRAPHY CHEST WITH CONTRAST  TECHNIQUE: Multidetector CT imaging of the chest was performed using the standard protocol during bolus administration of intravenous contrast. Multiplanar CT image reconstructions and MIPs were obtained to evaluate the vascular anatomy.  CONTRAST:  69mL OMNIPAQUE IOHEXOL 350 MG/ML SOLN  COMPARISON:  02/09/2014  FINDINGS: THORACIC INLET/BODY WALL:  No acute abnormality.  MEDIASTINUM:  Normal heart size. No pericardial effusion. No evidence of pulmonary embolism. Bolus dispersion and body shape decreases sensitivity beyond the segmental levels. Negative for adenopathy. Negative aorta.  LUNG WINDOWS:  No consolidation.  No effusion.  No suspicious pulmonary nodule.  UPPER ABDOMEN:  No acute findings.  OSSEOUS:  No acute fracture.  No suspicious lytic or blastic lesions.  Review of the  MIP images confirms the above findings.  IMPRESSION: No evidence of pulmonary embolism or other acute intrathoracic abnormality.   Electronically Signed   By: Jorje Guild M.D.   On: 06/04/2014 23:10   Dg Chest Portable 1 View  06/04/2014   CLINICAL DATA:  Loss of consciousness  EXAM: PORTABLE CHEST - 1 VIEW  COMPARISON:  Chest radiograph November 14, 2013 and chest CT February 09, 2014  FINDINGS: Lungs are clear. Heart size and pulmonary vascularity are normal. No pneumothorax. No adenopathy. No bone lesions.  IMPRESSION: No abnormality noted.   Electronically Signed   By: Lowella Grip M.D.   On: 06/04/2014 16:43     EKG Interpretation   Date/Time:  Monday June 04 2014 15:38:32 EDT Ventricular Rate:  62 PR Interval:  155 QRS Duration: 102 QT Interval:  442 QTC Calculation: 449 R Axis:   -10 Text Interpretation:  Atrial-paced complexes RSR' in V1 or V2, probably  normal variant Probable left ventricular hypertrophy Baseline wander in  lead(s) V3 Partial missing lead(s): V4 No significant change since last  tracing Confirmed by [place] ([id number]) on 06/04/2014 3:58:38 PM      MDM   Final diagnoses:  Syncope and collapse    53 yo F cc of syncope.  Atypical factors, headache and shortness of breath hx of prior DVT.  Will CT head, CT PE study, ecg cxr, cbc, trop, bmp.   Patient with unremarkable EKG negative PE study CBC troponin CMP also unremarkable. Patient is feeling well. We will discharge the patient home she'll follow with her PCP.   I have discussed the diagnosis/risks/treatment options with the patient and family and believe the pt to be eligible for discharge home to follow-up with PCP. We also discussed returning to the ED immediately if new or worsening sx occur. We discussed the sx which are most concerning (e.g., repeat syncope, chest pain, shortness of breath) that necessitate immediate return. Medications administered to the patient during their visit and any  new prescriptions provided to the patient are listed below.  Medications given during this visit Medications  sodium chloride 0.9 % bolus 1,000 mL (0 mLs Intravenous Stopped 06/04/14 2257)  iohexol (OMNIPAQUE) 350 MG/ML injection 80 mL (80 mLs Intravenous Contrast Given 06/04/14 2223)  LORazepam (ATIVAN) injection 1 mg (  1 mg Intravenous Given 06/04/14 2117)    Discharge Medication List as of 06/04/2014 11:57 PM       Deno Etienne, MD 06/05/14 9767

## 2014-06-04 NOTE — ED Notes (Addendum)
Pt to department via EMS- pt had a witnessed syncopal episode while at work. Episode lasted 2-3 minutes, had some twitching with the episode. Pt was somewhat combative on EMS arrival. Bp-180/102 Hr-65 CBG-107 20g LAC

## 2014-06-04 NOTE — Discharge Instructions (Signed)
Follow up with your PCP, return for repeat events.  Syncope Syncope means a person passes out (faints). The person usually wakes up in less than 5 minutes. It is important to seek medical care for syncope. HOME CARE  Have someone stay with you until you feel normal.  Do not drive, use machines, or play sports until your doctor says it is okay.  Keep all doctor visits as told.  Lie down when you feel like you might pass out. Take deep breaths. Wait until you feel normal before standing up.  Drink enough fluids to keep your pee (urine) clear or pale yellow.  If you take blood pressure or heart medicine, get up slowly. Take several minutes to sit and then stand. GET HELP RIGHT AWAY IF:   You have a severe headache.  You have pain in the chest, belly (abdomen), or back.  You are bleeding from the mouth or butt (rectum).  You have black or tarry poop (stool).  You have an irregular or very fast heartbeat.  You have pain with breathing.  You keep passing out, or you have shaking (seizures) when you pass out.  You pass out when sitting or lying down.  You feel confused.  You have trouble walking.  You have severe weakness.  You have vision problems. If you fainted, call your local emergency services (911 in [place] Do not drive yourself to the hospital. MAKE SURE YOU:   Understand these instructions.  Will watch your condition.  Will get help right away if you are not doing well or get worse. Document Released: 04/06/2008 Document Revised: 04/19/2012 Document Reviewed: 12/18/2011 [facility] Patient Information 2015 [place], [place]. This information is not intended to replace advice given to you by your health care provider. Make sure you discuss any questions you have with your health care provider.

## 2014-06-05 NOTE — ED Provider Notes (Signed)
I saw and evaluated the patient, reviewed the resident's note and I agree with the findings and plan.   EKG Interpretation   Date/Time:  Monday June 04 2014 15:38:32 EDT Ventricular Rate:  62 PR Interval:  155 QRS Duration: 102 QT Interval:  442 QTC Calculation: 449 R Axis:   -10 Text Interpretation:  Atrial-paced complexes RSR' in V1 or V2, probably  normal variant Probable left ventricular hypertrophy Baseline wander in  lead(s) V3 Partial missing lead(s): V4 No significant change since last  tracing Confirmed by Kori Colin  MD, Derrious Bologna ([id number]) on 06/04/2014 3:58:38 PM       Patient with syncope while at work. Return to normal baseline. Exam is unremarkable. She has several complaints that have been lasting for several weeks, including headache, shortness of breath like prior PE, and overall not feeling well. Work appears unremarkable. Given her headache is been long-standing I have very low suspicion this is a subarachnoid or other acute cause of syncope. This is not consistent with a cardiac cause of syncope. I feel she has been cleared and is stable for discharge.  Ephraim Hamburger, MD 06/05/14 [phone]

## 2014-07-03 HISTORY — PX: CARDIAC CATHETERIZATION: SHX172

## 2014-08-10 ENCOUNTER — Other Ambulatory Visit: Payer: Self-pay | Admitting: Physician Assistant

## 2014-09-28 ENCOUNTER — Other Ambulatory Visit: Payer: Self-pay | Admitting: Physician Assistant

## 2014-09-28 ENCOUNTER — Other Ambulatory Visit: Payer: Self-pay | Admitting: Family Medicine

## 2014-10-09 DIAGNOSIS — Z0271 Encounter for disability determination: Secondary | ICD-10-CM

## 2014-11-20 ENCOUNTER — Ambulatory Visit (INDEPENDENT_AMBULATORY_CARE_PROVIDER_SITE_OTHER): Payer: BLUE CROSS/BLUE SHIELD | Admitting: Family

## 2014-11-20 ENCOUNTER — Ambulatory Visit (INDEPENDENT_AMBULATORY_CARE_PROVIDER_SITE_OTHER)
Admission: RE | Admit: 2014-11-20 | Discharge: 2014-11-20 | Disposition: A | Discharge status: Home / Self Care | Payer: BLUE CROSS/BLUE SHIELD | Source: Ambulatory Visit | Attending: Family | Admitting: Family

## 2014-11-20 ENCOUNTER — Telehealth: Payer: Self-pay | Admitting: Family

## 2014-11-20 ENCOUNTER — Encounter: Payer: Self-pay | Admitting: Family

## 2014-11-20 VITALS — BP 172/120 | HR 74 | Temp 97.9°F | Resp 16 | Ht 67.0 in | Wt 212.0 lb

## 2014-11-20 DIAGNOSIS — R0602 Shortness of breath: Secondary | ICD-10-CM | POA: Insufficient documentation

## 2014-11-20 DIAGNOSIS — E785 Hyperlipidemia, unspecified: Secondary | ICD-10-CM

## 2014-11-20 DIAGNOSIS — I10 Essential (primary) hypertension: Secondary | ICD-10-CM

## 2014-11-20 MED ORDER — METOPROLOL TARTRATE 25 MG PO TABS: 25.0000 mg | ORAL_TABLET | Freq: Two times a day (BID) | ORAL | Status: DC

## 2014-11-20 MED ORDER — FUROSEMIDE 20 MG PO TABS
20.0000 mg | ORAL_TABLET | Freq: Every day | ORAL | Status: DC
Start: 2014-11-20 — End: 2015-01-29

## 2014-11-20 MED ORDER — ALBUTEROL SULFATE HFA 108 (90 BASE) MCG/ACT IN AERS: 2.0000 | INHALATION_SPRAY | Freq: Four times a day (QID) | RESPIRATORY_TRACT | Status: DC | PRN

## 2014-11-20 NOTE — Progress Notes (Signed)
Pre visit review using our clinic review tool, if applicable. No additional management support is needed unless otherwise documented below in the visit note. 

## 2014-11-20 NOTE — Progress Notes (Signed)
   Subjective:    Patient ID: [name], female    DOB: 01-12-1961, 54 y.o.   MRN: [id number]  Chief Complaint  Patient presents with  . Establish Care    Having very bad SOB, left leg and foot stay swollen and numb,     HPI:  [name] is a 54 y.o. female who presents today to establish care and discuss her weight gain and hypertension.  1) Hypertension - blood pressure is currently uncontrolled treated with no medication.    BP Readings from Last 3 Encounters:  11/20/14 172/120  06/05/14 99/57  05/06/14 183/100    2) Shortness of breath - Acute symptoms have been going on for about 4 months. 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  Indicates that she is barely able to walk short distances without becoming short of breath. Daughter notices at night she has periods of apnea. Recently had a cardiac catheterization which was determined to be normal.    3) Hyperlipidemia - currently stable with no medication.  Allergies  Allergen Reactions  . Morphine And Related Other (See Comments)    hallucinations  . Pantoprazole Sodium Other (See Comments)    blisters  . Prednisone Other (See Comments)    Blisters on tongue    Current Outpatient Prescriptions on File Prior to Visit  Medication Sig Dispense Refill  . aspirin EC 325 MG tablet Take 1 tablet (325 mg total) by mouth daily. 30 tablet 0  . Multiple Vitamin (MULITIVITAMIN WITH MINERALS) TABS Take 1 tablet by mouth daily.     No current facility-administered medications on file prior to visit.    Past Medical History  Diagnosis Date  . Hypertension   . Edema   . Pain in limb   . Back pain   . Asthma   . MVP (mitral valve prolapse) 12/22/2011  . S/P dilatation of esophageal stricture 12/22/2011    Review of Systems  Eyes:       Negative for change in vision.  Respiratory: Positive for shortness of breath. Negative for chest tightness and wheezing.   Cardiovascular:  Positive for palpitations and leg swelling. Negative for chest pain.  Neurological: Positive for dizziness and headaches.      Objective:    BP 172/120 mmHg  Pulse 74  Temp(Src) 97.9 F (36.6 C) (Oral)  Resp 16  Ht 5\' 7"  (1.702 m)  Wt 212 lb (96.163 kg)  BMI 33.20 kg/m2  SpO2 99% Nursing note and vital signs reviewed.  Physical Exam  Constitutional: She is oriented to person, place, and time. She appears well-developed and well-nourished. No distress.  Cardiovascular: Normal rate, regular rhythm, normal heart sounds and intact distal pulses.   Pulmonary/Chest: Effort normal and breath sounds normal.  Neurological: She is alert and oriented to person, place, and time.  Skin: Skin is warm and dry.  Psychiatric: She has a normal mood and affect. Her behavior is normal. Judgment and thought content normal.       Assessment & Plan:

## 2014-11-20 NOTE — Assessment & Plan Note (Signed)
Blood pressure remains uncontrolled at this time. Start furosemide daily. Start metoprolol twice a day. Discussed with patient's regarding scheduling eye exam. Will check kidney function at next visit.

## 2014-11-20 NOTE — Telephone Encounter (Signed)
Please inform the patient that her x-ray results show her heart size if the upper size of normal, but otherwise her lungs look clear, thus no specific reason for her shortness of breath with exertion.

## 2014-11-20 NOTE — Patient Instructions (Signed)
Thank you for choosing [facility].  Summary/Instructions:  Please obtain lab values for review.  Your prescription(s) have been submitted to your pharmacy or been printed and provided for you. Please take as directed and contact our office if you believe you are having problem(s) with the medication(s) or have any questions.  Please stop by the lab on the basement level of the building for your blood work. Your results will be released to [place] (or called to you) after review, usually within 72 hours after test completion. If any changes need to be made, you will be notified at that same time.   If your symptoms worsen or fail to improve, please contact our office for further instruction, or in case of emergency go directly to the emergency room at the closest medical facility.

## 2014-11-20 NOTE — Assessment & Plan Note (Signed)
Patient examined no apparent distress. Work of breathing was normal. Obtain chest x-ray to rule out underlying causes. Refill albuterol. Continue to monitor at this time. If symptoms worsen consider referral to pulmonology or cardiology.

## 2014-11-20 NOTE — Assessment & Plan Note (Signed)
Stable on no medications at this time. Continue lifestyle management.

## 2014-11-22 NOTE — Telephone Encounter (Signed)
Called pt and left VM for her to call back 

## 2014-11-26 NOTE — Telephone Encounter (Signed)
Tried calling pt again. No answer and left VM for her to call back.

## 2014-11-27 NOTE — Telephone Encounter (Signed)
Pt aware of results. Would like a cheaper inhaler than what she was called in.

## 2014-11-27 NOTE — Telephone Encounter (Signed)
That is the generic. Is there a specific inhaler she is looking for?

## 2014-11-28 MED ORDER — IPRATROPIUM-ALBUTEROL 20-100 MCG/ACT IN AERS: 1.0000 | INHALATION_SPRAY | Freq: Four times a day (QID) | RESPIRATORY_TRACT | Status: DC | PRN

## 2014-11-28 NOTE — Telephone Encounter (Signed)
Nothing specific just something cheaper

## 2014-11-28 NOTE — Telephone Encounter (Signed)
Pt aware.

## 2014-11-28 NOTE — Addendum Note (Signed)
Addended by: Mauricio Po D on: 11/28/2014 11:07 AM   Modules accepted: Orders

## 2014-11-28 NOTE — Telephone Encounter (Addendum)
I have sent another prescription

## 2014-12-13 ENCOUNTER — Telehealth: Payer: Self-pay | Admitting: Family

## 2014-12-14 ENCOUNTER — Ambulatory Visit: Payer: BLUE CROSS/BLUE SHIELD | Admitting: Family

## 2015-01-01 ENCOUNTER — Ambulatory Visit (INDEPENDENT_AMBULATORY_CARE_PROVIDER_SITE_OTHER): Payer: BLUE CROSS/BLUE SHIELD | Admitting: Family

## 2015-01-01 ENCOUNTER — Other Ambulatory Visit (INDEPENDENT_AMBULATORY_CARE_PROVIDER_SITE_OTHER): Payer: BLUE CROSS/BLUE SHIELD

## 2015-01-01 ENCOUNTER — Telehealth: Payer: Self-pay | Admitting: Family

## 2015-01-01 ENCOUNTER — Encounter: Payer: Self-pay | Admitting: Family

## 2015-01-01 VITALS — BP 182/110 | HR 64 | Temp 98.9°F | Resp 18 | Ht 67.0 in | Wt 209.4 lb

## 2015-01-01 DIAGNOSIS — I10 Essential (primary) hypertension: Secondary | ICD-10-CM

## 2015-01-01 DIAGNOSIS — R42 Dizziness and giddiness: Secondary | ICD-10-CM

## 2015-01-01 DIAGNOSIS — H538 Other visual disturbances: Secondary | ICD-10-CM

## 2015-01-01 LAB — CBC
HCT: 39 % (ref 36.0–46.0)
Hemoglobin: 13.4 g/dL (ref 12.0–15.0)
MCHC: 34.4 g/dL (ref 30.0–36.0)
MCV: 88.1 fl (ref 78.0–100.0)
PLATELETS: 200 10*3/uL (ref 150.0–400.0)
RBC: 4.42 Mil/uL (ref 3.87–5.11)
RDW: 13 % (ref 11.5–15.5)
WBC: 5 10*3/uL (ref 4.0–10.5)

## 2015-01-01 LAB — BASIC METABOLIC PANEL
BUN: 9 mg/dL (ref 6–23)
CALCIUM: 9.5 mg/dL (ref 8.4–10.5)
CO2: 32 meq/L (ref 19–32)
CREATININE: 0.85 mg/dL (ref 0.40–1.20)
Chloride: 104 mEq/L (ref 96–112)
GFR: 89.8 mL/min (ref >60.00)
GLUCOSE: 98 mg/dL (ref 70–99)
Potassium: 4.5 mEq/L (ref 3.5–5.1)
SODIUM: 139 meq/L (ref 135–145)

## 2015-01-01 LAB — HEMOGLOBIN A1C: HEMOGLOBIN A1C: 5.9 % (ref 4.6–6.5)

## 2015-01-01 LAB — TSH: TSH: 0.88 u[IU]/mL (ref 0.35–4.50)

## 2015-01-01 MED ORDER — LISINOPRIL 20 MG PO TABS: 20.0000 mg | ORAL_TABLET | Freq: Every day | ORAL | Status: DC

## 2015-01-01 NOTE — Patient Instructions (Signed)
Thank you for choosing [facility].  Summary/Instructions:  Your prescription(s) have been submitted to your pharmacy or been printed and provided for you. Please take as directed and contact our office if you believe you are having problem(s) with the medication(s) or have any questions.  Please stop by the lab on the basement level of the building for your blood work. Your results will be released to [place] (or called to you) after review, usually within 72 hours after test completion. If any changes need to be made, you will be notified at that same time.  If your symptoms worsen or fail to improve, please contact our office for further instruction, or in case of emergency go directly to the emergency room at the closest medical facility.   Please STOP taking the metoprolol and START taking lisinopril.

## 2015-01-01 NOTE — Assessment & Plan Note (Signed)
No definitive cause identifiable, could possibly be related to hypertension or blurred vision. Continue to drink plenty of fluids and change positions slowly. Obtain BMET, CBC, TSH and A1c to rule out metabolic causes. Follow up if symptoms worsen or fail to improve prior to pending lab work.

## 2015-01-01 NOTE — Assessment & Plan Note (Signed)
No definitive cause of blurred vision, however cannot rule out hypertension, diabetes, or thyroid. Obtain basic metabolic panel, TSH, and O8O. Refer to ophthalmologist for further evaluation and need for corrective lenses. Follow-up if symptoms worsen or fail to improve.

## 2015-01-01 NOTE — Assessment & Plan Note (Signed)
Blood pressure remains increased greater than goal of 140/90. Cannot rule out metoprolol as well as potential for side effects that patient is experiencing. Discontinue metoprolol and start lisinopril. Obtain basic metabolic panel.

## 2015-01-01 NOTE — Telephone Encounter (Signed)
Please inform the patient that her lab work shows that her A1c, kidney function, electrolytes, thyroid and white/red blood cells are all within the normal expected ranges. Therefore we will continue to work on decreasing her blood pressure to see if that resolves her symptoms.

## 2015-01-01 NOTE — Progress Notes (Signed)
Pre visit review using our clinic review tool, if applicable. No additional management support is needed unless otherwise documented below in the visit note. 

## 2015-01-01 NOTE — Progress Notes (Signed)
Subjective:    Patient ID: [name], female    DOB: [birth year], 54 y.o.   MRN: [id number]  Chief Complaint  Patient presents with  . Blurred Vision    still having really bad dizzy spells, very blurred vision, hypertension, yesterday had to constantly go to the bathroom, really bad memory has gotten worse    HPI:  [name] is a 53 y.o. female who presents today for a follow up.  1) Dizzy spells - Indicates that she has been having increased amount of dizzy spells over the last 6 months. Has not done any treatments for it.   2) Blurred vision - Associated symptom of blurred vision has been going on for about a month. States that she wears glasses, however they do not help.   3) Blood pressure - Remains labile at this time. Currently treated with metoprolol.   BP Readings from Last 3 Encounters:  01/01/15 182/110  11/20/14 172/120  06/05/14 99/57     Allergies  Allergen Reactions  . Morphine And Related Other (See Comments)    hallucinations  . Pantoprazole Sodium Other (See Comments)    blisters  . Prednisone Other (See Comments)    Blisters on tongue    Current Outpatient Prescriptions on File Prior to Visit  Medication Sig Dispense Refill  . albuterol (PROVENTIL HFA;VENTOLIN HFA) 108 (90 BASE) MCG/ACT inhaler Inhale 2 puffs into the lungs every 6 (six) hours as needed for wheezing or shortness of breath. 1 Inhaler 3  . aspirin EC 325 MG tablet Take 1 tablet (325 mg total) by mouth daily. 30 tablet 0  . furosemide (LASIX) 20 MG tablet Take 1 tablet (20 mg total) by mouth daily. 30 tablet 3  . Ipratropium-Albuterol (COMBIVENT) 20-100 MCG/ACT AERS respimat Inhale 1 puff into the lungs every 6 (six) hours as needed for wheezing. 1 Inhaler 2  . Multiple Vitamin (MULITIVITAMIN WITH MINERALS) TABS Take 1 tablet by mouth daily.     No current facility-administered medications on file prior to visit.    Past Medical History  Diagnosis Date  . Hypertension   .  Edema   . Pain in limb   . Back pain   . Asthma   . MVP (mitral valve prolapse) 12/22/2011  . S/P dilatation of esophageal stricture 12/22/2011    Review of Systems  Eyes:       Positive for blurred vision.   Respiratory: Negative for chest tightness.   Cardiovascular: Negative for chest pain, palpitations and leg swelling.  Neurological: Positive for dizziness.      Objective:    BP 182/110 mmHg  Pulse 64  Temp(Src) 98.9 F (37.2 C) (Oral)  Resp 18  Ht 5\' 7"  (1.702 m)  Wt 209 lb 6.4 oz (94.983 kg)  BMI 32.79 kg/m2  SpO2 97% Nursing note and vital signs reviewed.  Physical Exam  Constitutional: She is oriented to person, place, and time. She appears well-developed and well-nourished. No distress.  Eyes: Conjunctivae, EOM and lids are normal. Pupils are equal, round, and reactive to light. Lids are everted and swept, no foreign bodies found.  Fundoscopic exam:      The right eye shows red reflex.       The left eye shows red reflex.  Cardiovascular: Normal rate, regular rhythm, normal heart sounds and intact distal pulses.   Pulmonary/Chest: Effort normal and breath sounds normal.  Neurological: She is alert and oriented to person, place, and time.  Skin: Skin is  warm and dry.  Psychiatric: She has a normal mood and affect. Her behavior is normal. Judgment and thought content normal.       Assessment & Plan:

## 2015-01-02 NOTE — Telephone Encounter (Signed)
LVM for pt to call back.

## 2015-01-03 MED ORDER — ALPRAZOLAM 0.25 MG PO TABS: 0.2500 mg | ORAL_TABLET | Freq: Two times a day (BID) | ORAL | Status: DC | PRN

## 2015-01-03 NOTE — Telephone Encounter (Signed)
Pt aware of results. Wants to know what her blood type is and if we can do that here? She would also like a new rx for xanax for her anxiety. Please advise.

## 2015-01-03 NOTE — Telephone Encounter (Signed)
Xanax prescription printed. I do not believe that our lab here does the Type and Screen, but we shall check on it.

## 2015-01-03 NOTE — Telephone Encounter (Signed)
Pt aware.

## 2015-01-24 ENCOUNTER — Telehealth: Payer: Self-pay | Admitting: Family

## 2015-01-24 NOTE — Telephone Encounter (Signed)
Patient states her bp is high.  Patient has gone back to previous bp med.  States she feels she is suppose to be monitored and is not.  She feels like she is having hot flashes every 30 minutes.  Patient states she feels like she is having a break down.  Patient is requesting call in regards.

## 2015-01-24 NOTE — Telephone Encounter (Signed)
Please have her make a follow up appointment for the blood pressure so we can devise a plan. I have sent in a referral to GYN for the hot flashes. And in the mean time if her symptoms worsen go to Urgent Care.

## 2015-01-24 NOTE — Telephone Encounter (Signed)
Pt states that she doesn't feel that anyone understands what is going on with her and that there is something that people are missing something.

## 2015-01-28 NOTE — Telephone Encounter (Signed)
She is going to call sometime this week to make an appointment to see what she needs to do next.

## 2015-01-29 ENCOUNTER — Ambulatory Visit (INDEPENDENT_AMBULATORY_CARE_PROVIDER_SITE_OTHER): Payer: BLUE CROSS/BLUE SHIELD | Admitting: Family

## 2015-01-29 ENCOUNTER — Telehealth: Payer: Self-pay | Admitting: Family

## 2015-01-29 ENCOUNTER — Encounter: Payer: Self-pay | Admitting: Family

## 2015-01-29 VITALS — BP 180/100 | HR 81 | Temp 97.9°F | Resp 18 | Ht 67.0 in | Wt 210.0 lb

## 2015-01-29 DIAGNOSIS — M7989 Other specified soft tissue disorders: Secondary | ICD-10-CM | POA: Insufficient documentation

## 2015-01-29 DIAGNOSIS — M329 Systemic lupus erythematosus, unspecified: Secondary | ICD-10-CM

## 2015-01-29 DIAGNOSIS — R768 Other specified abnormal immunological findings in serum: Secondary | ICD-10-CM | POA: Insufficient documentation

## 2015-01-29 DIAGNOSIS — I10 Essential (primary) hypertension: Secondary | ICD-10-CM

## 2015-01-29 HISTORY — DX: Other specified abnormal immunological findings in serum: R76.8

## 2015-01-29 MED ORDER — FUROSEMIDE 40 MG PO TABS: 40.0000 mg | ORAL_TABLET | Freq: Every day | ORAL | Status: DC

## 2015-01-29 MED ORDER — METOPROLOL TARTRATE 50 MG PO TABS: 50.0000 mg | ORAL_TABLET | Freq: Two times a day (BID) | ORAL | Status: DC

## 2015-01-29 NOTE — Assessment & Plan Note (Signed)
Patient indicates positive DNA testing previously for lupus. She was seen by rheumatology however did not like the physician that she saw and was no longer going back there. Refer to rheumatology for management of lupus.

## 2015-01-29 NOTE — Progress Notes (Signed)
Subjective:    Patient ID: [name], female    DOB: Jan 17, 1961, 54 y.o.   MRN: [id number]  Chief Complaint  Patient presents with  . Follow-up    memory issues, numbness in her hands and fingers, hot flashes, foot is swollen, gaining weight, says she can tell when hot flashes, feels nerves throughout her arms    HPI:  [name] is a 54 y.o. female who presents today for follow up of multiple problems.  1) Blood pressure - Stopped taking the lisinopril because she was afraid it was going to interact with her Lupus. She restarted the metoprolol 25 mg twice daily. Indicates that this appears to be helping a little bit. Denies any adverse effects of the medication.  BP Readings from Last 3 Encounters:  01/29/15 180/100  01/01/15 182/110  11/20/14 172/120    2) Swelling of legs - notes increased swelling of her legs. She currently takes 20 mg of furosemide. Also notes significant amount of weight gain recently.  3) Lupus - Indicates she was diagnosed with SLE in January and prior to starting here. Patient indicates that she was previously seen by a rhumatologist however did not get along with her very well and did not go back. She is very concerned about Lupus.   Allergies  Allergen Reactions  . Morphine And Related Other (See Comments)    hallucinations  . Pantoprazole Sodium Other (See Comments)    blisters  . Prednisone Other (See Comments)    Blisters on tongue    Current Outpatient Prescriptions on File Prior to Visit  Medication Sig Dispense Refill  . albuterol (PROVENTIL HFA;VENTOLIN HFA) 108 (90 BASE) MCG/ACT inhaler Inhale 2 puffs into the lungs every 6 (six) hours as needed for wheezing or shortness of breath. 1 Inhaler 3  . ALPRAZolam (XANAX) 0.25 MG tablet Take 1 tablet (0.25 mg total) by mouth 2 (two) times daily as needed for anxiety. 20 tablet 0  . aspirin EC 325 MG tablet Take 1 tablet (325 mg total) by mouth daily. 30 tablet 0  . Ipratropium-Albuterol  (COMBIVENT) 20-100 MCG/ACT AERS respimat Inhale 1 puff into the lungs every 6 (six) hours as needed for wheezing. 1 Inhaler 2  . lisinopril (PRINIVIL,ZESTRIL) 20 MG tablet Take 1 tablet (20 mg total) by mouth daily. 30 tablet 0  . Multiple Vitamin (MULITIVITAMIN WITH MINERALS) TABS Take 1 tablet by mouth daily.     No current facility-administered medications on file prior to visit.    Past Medical History  Diagnosis Date  . Hypertension   . Edema   . Pain in limb   . Back pain   . Asthma   . MVP (mitral valve prolapse) 12/22/2011  . S/P dilatation of esophageal stricture 12/22/2011  . Lupus (systemic lupus erythematosus) 01/29/2015    Review of Systems  Constitutional: Negative for fever, chills and unexpected weight change.  Respiratory: Negative for cough, chest tightness, shortness of breath and wheezing.   Cardiovascular: Positive for leg swelling. Negative for chest pain and palpitations.  Neurological: Positive for numbness.      Objective:    BP 180/100 mmHg  Pulse 81  Temp(Src) 97.9 F (36.6 C) (Oral)  Resp 18  Ht 5\' 7"  (1.702 m)  Wt 210 lb (95.255 kg)  BMI 32.88 kg/m2  SpO2 93% Nursing note and vital signs reviewed.  Physical Exam  Constitutional: She is oriented to person, place, and time. She appears well-developed and well-nourished. No distress.  Cardiovascular:  Normal rate, regular rhythm, normal heart sounds and intact distal pulses.   Moderate amount of non-pitting edema noted in bilateral ankles with the left>right. Pulses are intact and appropriate.   Pulmonary/Chest: Effort normal and breath sounds normal.  Neurological: She is alert and oriented to person, place, and time.  Skin: Skin is warm and dry. No rash noted.  Psychiatric: She has a normal mood and affect. Her behavior is normal. Judgment and thought content normal.       Assessment & Plan:

## 2015-01-29 NOTE — Assessment & Plan Note (Signed)
Patient's blood pressure remains uncontrolled. She discontinued her lisinopril independently and restarted her metoprolol 25 mg twice daily. Indicates that has helped some. Increase metoprolol to 50 mg twice a day. Increase furosemide to 40 mg daily. Follow up with phone call in one week to determine effectiveness.

## 2015-01-29 NOTE — Assessment & Plan Note (Signed)
Question possible cardiac origin of lower legs edema. Low risk for DVT as edema is bilateral. Decrease furosemide to 40 mg daily. Continue conservative treatment of decreasing sodium, elevating legs, and compression socks as needed. If increase in Lasix is not effective, consider new 2-D echocardiogram of the heart to rule out cardiac origin.

## 2015-01-29 NOTE — Patient Instructions (Signed)
Thank you for choosing [facility].  Summary/Instructions:  Your prescription(s) have been submitted to your pharmacy or been printed and provided for you. Please take as directed and contact our office if you believe you are having problem(s) with the medication(s) or have any questions.  If your symptoms worsen or fail to improve, please contact our office for further instruction, or in case of emergency go directly to the emergency room at the closest medical facility.    Lupus Lupus (also called systemic lupus erythematosus, SLE) is a disorder of the body's natural defense system (immune system). In lupus, the immune system attacks various areas of the body (autoimmune disease). CAUSES The cause is unknown. However, lupus runs in families. Certain genes can make you more likely to develop lupus. It is 10 times more common in women than in men. Lupus is also more common in African Americans and Asians. Other factors also play a role, such as viruses (Epstein-Barr virus, EBV), stress, hormones, cigarette smoke, and certain drugs. SYMPTOMS Lupus can affect many parts of the body, including the joints, skin, kidneys, lungs, heart, nervous system, and blood vessels. The signs and symptoms of lupus differ from person to person. The disease can range from mild to life-threatening. Typical features of lupus include:  Butterfly-shaped rash over the face.  Arthritis involving one or more joints.  Kidney disease.  Fever, weight loss, hair loss, fatigue.  Poor circulation in the fingers and toes (Raynaud's disease).  Chest pain when taking deep breaths. Abdominal pain may also occur.  Skin rash in areas exposed to the sun.  Sores in the mouth and nose. DIAGNOSIS Diagnosing lupus can take a long time and is often difficult. An exam and an accurate account of your symptoms and health problems is very important. Blood tests are necessary, though no single test can confirm or rule out  lupus. Most people with lupus test positive for antinuclear antibodies (ANA) on a blood test. Additional blood tests, a urine test (urinalysis), and sometimes a kidney or skin tissue sample (biopsy) can help to confirm or rule out lupus. TREATMENT There is no cure for lupus. Your caregiver will develop a treatment plan based on your age, sex, health, symptoms, and lifestyle. The goals are to prevent flares, to treat them when they do occur, and to minimize organ damage and complications. How the disease may affect each person varies widely. Most people with lupus can live normal lives, but this disorder must be carefully monitored. Treatment must be adjusted as necessary to prevent serious complications. Medicines used for treatment:  Nonsteroidal anti-inflammatory drugs (NSAIDs) decrease inflammation and can help with chest pain, joint pain, and fevers. Examples include ibuprofen and naproxen.  Antimalarial drugs were designed to treat malaria. They also treat fatigue, joint pain, skin rashes, and inflammation of the lungs in patients with lupus.  Corticosteroids are powerful hormones that rapidly suppress inflammation. The lowest dose with the highest benefit will be chosen. They can be given by cream, pills, injections, and through the vein (intravenously).  Immunosuppressive drugs block the making of immune cells. They may be used for kidney or nerve disease. HOME CARE INSTRUCTIONS  Exercise. Low-impact activities can usually help keep joints flexible without being too strenuous.  Rest after periods of exercise.  Avoid excessive sun exposure.  Follow proper nutrition and take supplements as recommended by your caregiver.  Stress management can be helpful. SEEK MEDICAL CARE IF:  You have increased fatigue.  You develop pain.  You develop a  rash.  You have an oral temperature above 102 F (38.9 C).  You develop abdominal discomfort.  You develop a headache.  You experience  dizziness. [place] of Neurological Disorders and Stroke: [employer] of Rheumatology: www.rheumatology.[facility] of Arthritis and Musculoskeletal and Skin Diseases: www.niams.[employer] Document Released: 10/09/2002 Document Revised: 01/11/2012 Document Reviewed: 01/30/2010 [facility] Patient Information 2015 [place], [place]. This information is not intended to replace advice given to you by your health care provider. Make sure you discuss any questions you have with your health care provider.

## 2015-01-29 NOTE — Progress Notes (Signed)
Pre visit review using our clinic review tool, if applicable. No additional management support is needed unless otherwise documented below in the visit note. 

## 2015-01-29 NOTE — Telephone Encounter (Signed)
emmi emailed °

## 2015-02-13 ENCOUNTER — Telehealth: Payer: Self-pay | Admitting: Family

## 2015-02-13 ENCOUNTER — Ambulatory Visit (INDEPENDENT_AMBULATORY_CARE_PROVIDER_SITE_OTHER): Payer: BLUE CROSS/BLUE SHIELD | Admitting: Family

## 2015-02-13 ENCOUNTER — Encounter: Payer: Self-pay | Admitting: Family

## 2015-02-13 VITALS — BP 152/92 | HR 54 | Temp 98.2°F | Resp 18 | Ht 67.0 in | Wt 208.8 lb

## 2015-02-13 DIAGNOSIS — M329 Systemic lupus erythematosus, unspecified: Secondary | ICD-10-CM | POA: Diagnosis not present

## 2015-02-13 MED ORDER — HYDROXYCHLOROQUINE SULFATE 200 MG PO TABS: 400.0000 mg | ORAL_TABLET | Freq: Every day | ORAL | Status: DC

## 2015-02-13 MED ORDER — IBUPROFEN 800 MG PO TABS: 800.0000 mg | ORAL_TABLET | Freq: Three times a day (TID) | ORAL | Status: DC | PRN

## 2015-02-13 NOTE — Progress Notes (Signed)
Pre visit review using our clinic review tool, if applicable. No additional management support is needed unless otherwise documented below in the visit note. 

## 2015-02-13 NOTE — Progress Notes (Signed)
Subjective:    Patient ID: [name], female    DOB: 08/21/1961, 54 y.o.   MRN: [id number]  Chief Complaint  Patient presents with  . Generalized Body Aches    says crying herself to sleep at night and throughout the day bc of pain throughout her whole body, her hands hurt so bad, says she feels like movement triggers "hot flashes", says she feels so bad she ready to give up, swelling in face when waking up in the morning, swelling in hands and arm, not able to go to the bathroom, says when she gets hotflashes she feels like needles sticking all over her body, feels like she can not function in daily living    HPI:  [name] is a 54 y.o. female who presents today for follow up.  Associated symptoms of generalized body aches and "hot flashes" ave been going on for several months. Indicates that her symptoms are worsening and the pain in her hands has increased and feels like every movement triggers the "hot flashes." Provided documentation today that she previously tested positive for Lupus in August 2015.  Documentation was sent to scan. She expresses frustration about not knowing what is wrong, and indicates that she has though about suicide but has no current plans and is only a fleeting thought secondary to the pain that she is experiencing.    Allergies  Allergen Reactions  . Morphine And Related Other (See Comments)    hallucinations  . Pantoprazole Sodium Other (See Comments)    blisters  . Prednisone Other (See Comments)    Blisters on tongue    Current Outpatient Prescriptions on File Prior to Visit  Medication Sig Dispense Refill  . albuterol (PROVENTIL HFA;VENTOLIN HFA) 108 (90 BASE) MCG/ACT inhaler Inhale 2 puffs into the lungs every 6 (six) hours as needed for wheezing or shortness of breath. 1 Inhaler 3  . ALPRAZolam (XANAX) 0.25 MG tablet Take 1 tablet (0.25 mg total) by mouth 2 (two) times daily as needed for anxiety. 20 tablet 0  . aspirin EC 325 MG tablet  Take 1 tablet (325 mg total) by mouth daily. 30 tablet 0  . furosemide (LASIX) 40 MG tablet Take 1 tablet (40 mg total) by mouth daily. 30 tablet 0  . Ipratropium-Albuterol (COMBIVENT) 20-100 MCG/ACT AERS respimat Inhale 1 puff into the lungs every 6 (six) hours as needed for wheezing. 1 Inhaler 2  . lisinopril (PRINIVIL,ZESTRIL) 20 MG tablet Take 1 tablet (20 mg total) by mouth daily. 30 tablet 0  . metoprolol (LOPRESSOR) 50 MG tablet Take 1 tablet (50 mg total) by mouth 2 (two) times daily. 60 tablet 0  . Multiple Vitamin (MULITIVITAMIN WITH MINERALS) TABS Take 1 tablet by mouth daily.     No current facility-administered medications on file prior to visit.    Review of Systems  Constitutional: Positive for unexpected weight change. Negative for fever and chills.  Psychiatric/Behavioral: Positive for suicidal ideas and sleep disturbance.      Objective:    BP 152/92 mmHg  Pulse 54  Temp(Src) 98.2 F (36.8 C) (Oral)  Resp 18  Ht 5\' 7"  (1.702 m)  Wt 208 lb 12.8 oz (94.711 kg)  BMI 32.69 kg/m2  SpO2 99% Nursing note and vital signs reviewed.  Physical Exam  Constitutional: She is oriented to person, place, and time. She appears well-developed and well-nourished. No distress.  Cardiovascular: Normal rate, regular rhythm, normal heart sounds and intact distal pulses.   Pulmonary/Chest: Effort  normal and breath sounds normal.  Neurological: She is alert and oriented to person, place, and time.  Skin: Skin is warm and dry.  Psychiatric: She has a normal mood and affect. Her behavior is normal. Judgment and thought content normal.       Assessment & Plan:

## 2015-02-13 NOTE — Patient Instructions (Addendum)
Thank you for choosing [facility].  Summary/Instructions:  Your prescription(s) have been submitted to your pharmacy or been printed and provided for you. Please take as directed and contact our office if you believe you are having problem(s) with the medication(s) or have any questions.  If your symptoms worsen or fail to improve, please contact our office for further instruction, or in case of emergency go directly to the emergency room at the closest medical facility.   Lupus Lupus (also called systemic lupus erythematosus, SLE) is a disorder of the body's natural defense system (immune system). In lupus, the immune system attacks various areas of the body (autoimmune disease). CAUSES The cause is unknown. However, lupus runs in families. Certain genes can make you more likely to develop lupus. It is 10 times more common in women than in men. Lupus is also more common in African Americans and Asians. Other factors also play a role, such as viruses (Epstein-Barr virus, EBV), stress, hormones, cigarette smoke, and certain drugs. SYMPTOMS Lupus can affect many parts of the body, including the joints, skin, kidneys, lungs, heart, nervous system, and blood vessels. The signs and symptoms of lupus differ from person to person. The disease can range from mild to life-threatening. Typical features of lupus include:  Butterfly-shaped rash over the face.  Arthritis involving one or more joints.  Kidney disease.  Fever, weight loss, hair loss, fatigue.  Poor circulation in the fingers and toes (Raynaud's disease).  Chest pain when taking deep breaths. Abdominal pain may also occur.  Skin rash in areas exposed to the sun.  Sores in the mouth and nose. DIAGNOSIS Diagnosing lupus can take a long time and is often difficult. An exam and an accurate account of your symptoms and health problems is very important. Blood tests are necessary, though no single test can confirm or rule out lupus.  Most people with lupus test positive for antinuclear antibodies (ANA) on a blood test. Additional blood tests, a urine test (urinalysis), and sometimes a kidney or skin tissue sample (biopsy) can help to confirm or rule out lupus. TREATMENT There is no cure for lupus. Your caregiver will develop a treatment plan based on your age, sex, health, symptoms, and lifestyle. The goals are to prevent flares, to treat them when they do occur, and to minimize organ damage and complications. How the disease may affect each person varies widely. Most people with lupus can live normal lives, but this disorder must be carefully monitored. Treatment must be adjusted as necessary to prevent serious complications. Medicines used for treatment:  Nonsteroidal anti-inflammatory drugs (NSAIDs) decrease inflammation and can help with chest pain, joint pain, and fevers. Examples include ibuprofen and naproxen.  Antimalarial drugs were designed to treat malaria. They also treat fatigue, joint pain, skin rashes, and inflammation of the lungs in patients with lupus.  Corticosteroids are powerful hormones that rapidly suppress inflammation. The lowest dose with the highest benefit will be chosen. They can be given by cream, pills, injections, and through the vein (intravenously).  Immunosuppressive drugs block the making of immune cells. They may be used for kidney or nerve disease. HOME CARE INSTRUCTIONS  Exercise. Low-impact activities can usually help keep joints flexible without being too strenuous.  Rest after periods of exercise.  Avoid excessive sun exposure.  Follow proper nutrition and take supplements as recommended by your caregiver.  Stress management can be helpful. SEEK MEDICAL CARE IF:  You have increased fatigue.  You develop pain.  You develop a rash.  You have an oral temperature above 102 F (38.9 C).  You develop abdominal discomfort.  You develop a headache.  You experience  dizziness. [place] of Neurological Disorders and Stroke: [employer] of Rheumatology: www.rheumatology.[facility] of Arthritis and Musculoskeletal and Skin Diseases: www.niams.[employer] Document Released: 10/09/2002 Document Revised: 01/11/2012 Document Reviewed: 01/30/2010 [facility] Patient Information 2015 [place], [place]. This information is not intended to replace advice given to you by your health care provider. Make sure you discuss any questions you have with your health care provider.

## 2015-02-13 NOTE — Assessment & Plan Note (Signed)
Positive blood work for lupus and currently awaiting appointment with Rheumatology. Start Plaquenil. Continue ibuprofen as needed for inflammation and pain. Indicates that she feels a little better knowing what the plan is and that it is in process. She has a safety agreement with her daughter who stays with her. Follow up pending Rheumatology appointment.

## 2015-02-13 NOTE — Telephone Encounter (Signed)
I made an appt this afternoon at 1 with patient and daughter is very concerned about her. She would like it if you could give her a call before the appointment

## 2015-02-13 NOTE — Telephone Encounter (Signed)
Pt here for her 1 o'clock appointment. Speaking with greg right now.

## 2015-02-25 ENCOUNTER — Emergency Department (HOSPITAL_COMMUNITY): Payer: BLUE CROSS/BLUE SHIELD

## 2015-02-25 ENCOUNTER — Emergency Department (HOSPITAL_COMMUNITY)
Admission: EM | Admit: 2015-02-25 | Discharge: 2015-02-25 | Disposition: A | Discharge status: Home / Self Care | Payer: BLUE CROSS/BLUE SHIELD | Attending: Emergency Medicine | Admitting: Emergency Medicine

## 2015-02-25 ENCOUNTER — Telehealth: Payer: Self-pay | Admitting: Family

## 2015-02-25 ENCOUNTER — Encounter (HOSPITAL_COMMUNITY): Payer: Self-pay

## 2015-02-25 DIAGNOSIS — I10 Essential (primary) hypertension: Secondary | ICD-10-CM | POA: Insufficient documentation

## 2015-02-25 DIAGNOSIS — J45909 Unspecified asthma, uncomplicated: Secondary | ICD-10-CM | POA: Insufficient documentation

## 2015-02-25 DIAGNOSIS — R110 Nausea: Secondary | ICD-10-CM | POA: Insufficient documentation

## 2015-02-25 DIAGNOSIS — Z79899 Other long term (current) drug therapy: Secondary | ICD-10-CM | POA: Diagnosis not present

## 2015-02-25 DIAGNOSIS — Z7982 Long term (current) use of aspirin: Secondary | ICD-10-CM | POA: Diagnosis not present

## 2015-02-25 DIAGNOSIS — R51 Headache: Secondary | ICD-10-CM | POA: Insufficient documentation

## 2015-02-25 DIAGNOSIS — R410 Disorientation, unspecified: Secondary | ICD-10-CM | POA: Insufficient documentation

## 2015-02-25 DIAGNOSIS — R4701 Aphasia: Secondary | ICD-10-CM | POA: Diagnosis not present

## 2015-02-25 DIAGNOSIS — M542 Cervicalgia: Secondary | ICD-10-CM | POA: Insufficient documentation

## 2015-02-25 DIAGNOSIS — Z9889 Other specified postprocedural states: Secondary | ICD-10-CM | POA: Diagnosis not present

## 2015-02-25 DIAGNOSIS — R519 Headache, unspecified: Secondary | ICD-10-CM

## 2015-02-25 LAB — CBC WITH DIFFERENTIAL/PLATELET
Basophils Absolute: 0 10*3/uL (ref 0.0–0.1)
Basophils Relative: 1 % (ref 0–1)
EOS ABS: 0.2 10*3/uL (ref 0.0–0.7)
EOS PCT: 3 % (ref 0–5)
HCT: 38.3 % (ref 36.0–46.0)
Hemoglobin: 12.6 g/dL (ref 12.0–15.0)
Lymphocytes Relative: 33 % (ref 12–46)
Lymphs Abs: 2 10*3/uL (ref 0.7–4.0)
MCH: 29.9 pg (ref 26.0–34.0)
MCHC: 32.9 g/dL (ref 30.0–36.0)
MCV: 90.8 fL (ref 78.0–100.0)
MONOS PCT: 6 % (ref 3–12)
Monocytes Absolute: 0.4 10*3/uL (ref 0.1–1.0)
NEUTROS PCT: 57 % (ref 43–77)
Neutro Abs: 3.5 10*3/uL (ref 1.7–7.7)
Platelets: 177 10*3/uL (ref 150–400)
RBC: 4.22 MIL/uL (ref 3.87–5.11)
RDW: 12.6 % (ref 11.5–15.5)
WBC: 6.1 10*3/uL (ref 4.0–10.5)

## 2015-02-25 LAB — BASIC METABOLIC PANEL
Anion gap: 9 (ref 5–15)
BUN: 10 mg/dL (ref 6–23)
CHLORIDE: 101 mmol/L (ref 96–112)
CO2: 30 mmol/L (ref 19–32)
CREATININE: 0.81 mg/dL (ref 0.50–1.10)
Calcium: 9.4 mg/dL (ref 8.4–10.5)
GFR calc Af Amer: >90 mL/min (ref >90)
GFR, EST NON AFRICAN AMERICAN: 81 mL/min — AB (ref >90)
Glucose, Bld: 86 mg/dL (ref 70–99)
Potassium: 3.9 mmol/L (ref 3.5–5.1)
Sodium: 140 mmol/L (ref 135–145)

## 2015-02-25 MED ORDER — LORAZEPAM 2 MG/ML IJ SOLN
1.0000 mg | Freq: Once | INTRAMUSCULAR | Status: AC
  Administered 2015-02-25: 1 mg via INTRAVENOUS
  Filled 2015-02-25: qty 1

## 2015-02-25 MED ORDER — DIPHENHYDRAMINE HCL 50 MG/ML IJ SOLN
25.0000 mg | Freq: Once | INTRAMUSCULAR | Status: AC
  Administered 2015-02-25: 25 mg via INTRAVENOUS
  Filled 2015-02-25: qty 1

## 2015-02-25 MED ORDER — SODIUM CHLORIDE 0.9 % IV BOLUS (SEPSIS)
1000.0000 mL | Freq: Once | INTRAVENOUS | Status: AC
  Administered 2015-02-25: 1000 mL via INTRAVENOUS

## 2015-02-25 MED ORDER — METOCLOPRAMIDE HCL 5 MG/ML IJ SOLN
10.0000 mg | Freq: Once | INTRAMUSCULAR | Status: AC
  Administered 2015-02-25: 10 mg via INTRAVENOUS
  Filled 2015-02-25: qty 2

## 2015-02-25 NOTE — ED Notes (Signed)
Patient transported to MRI 

## 2015-02-25 NOTE — ED Notes (Addendum)
Pt presents with c/o headache that started approx one hour ago. Pt reports nausea and light sensitivity. Pt reports confusion at the onset of the headache. Pt reports no confusion at this time, talking in complete sentences. Alert and oriented x 4. Pt denies any hx of migraines.

## 2015-02-25 NOTE — ED Notes (Signed)
Return from MRI

## 2015-02-25 NOTE — ED Notes (Signed)
Awake. Verbally responsive. A/O x4. Resp even and unlabored. No audible adventitious breath sounds noted. ABC's intact.  

## 2015-02-25 NOTE — Telephone Encounter (Signed)
Called pts son back. Needed medications that pt is taking

## 2015-02-25 NOTE — ED Provider Notes (Signed)
CSN: [id number]     Arrival date & time 02/25/15  1438 History   First MD Initiated Contact with Patient 02/25/15 1535     Chief Complaint  Patient presents with  . Headache  . Temporary confusion      (Consider location/radiation/quality/duration/timing/severity/associated sxs/prior Treatment) HPI  54 year old female presents with a headache started around 2 hours ago. She states she was in the grocery store when she had a gradually worsening headache. It feels like pressure all over her head. She felt like she was in a dream and wasn't responsive. However her son at the bedside states that she called her daughter and had slurred speech during this time. Patient denies any weakness or numbness. Has had some nausea and light sensitivity. Took ibuprofen in the waiting room just prior to me seeing the patient. She has never had headaches like this before. The patient states the headache is somewhat improved but still moderate in intensity. The confusion and "dream state" are resolved. Son at the bedside states her speech is normal. Also complaining of left sided neck pain/stiffness for the past 1 year, not worse now.  Past Medical History  Diagnosis Date  . Hypertension   . Edema   . Pain in limb   . Back pain   . Asthma   . MVP (mitral valve prolapse) 12/22/2011  . S/P dilatation of esophageal stricture 12/22/2011  . Lupus (systemic lupus erythematosus) 01/29/2015   Past Surgical History  Procedure Laterality Date  . Abdominal hysterectomy    . Cardiac catheterization    . Tonsillectomy    . Spine surgery     Family History  Problem Relation Age of Onset  . Diabetes Mother   . Hypertension Mother   . Heart disease Mother   . Diabetes Father   . Hypertension Father   . COPD Father   . Heart attack Father 74  . Stroke Other   . Coronary artery disease Other    History  Substance Use Topics  . Smoking status: Never Smoker   . Smokeless tobacco: Never Used  . Alcohol Use: No     OB History    No data available     Review of Systems  Constitutional: Negative for fever.  Gastrointestinal: Positive for nausea. Negative for vomiting.  Musculoskeletal: Positive for neck pain and neck stiffness.  Neurological: Positive for speech difficulty and headaches. Negative for weakness and numbness.  Psychiatric/Behavioral: Positive for confusion.  All other systems reviewed and are negative.     Allergies  Morphine and related; Pantoprazole sodium; and Prednisone  Home Medications   Prior to Admission medications   Medication Sig Start Date End Date Taking? Authorizing Provider  albuterol (PROVENTIL HFA;VENTOLIN HFA) 108 (90 BASE) MCG/ACT inhaler Inhale 2 puffs into the lungs every 6 (six) hours as needed for wheezing or shortness of breath. 11/20/14   Golden Circle, FNP  ALPRAZolam Duanne Moron) 0.25 MG tablet Take 1 tablet (0.25 mg total) by mouth 2 (two) times daily as needed for anxiety. 01/03/15   Golden Circle, FNP  aspirin EC 325 MG tablet Take 1 tablet (325 mg total) by mouth daily. 04/06/13   Varney Biles, MD  furosemide (LASIX) 40 MG tablet Take 1 tablet (40 mg total) by mouth daily. 01/29/15   Golden Circle, FNP  hydroxychloroquine (PLAQUENIL) 200 MG tablet Take 2 tablets (400 mg total) by mouth daily. 02/13/15   Golden Circle, FNP  ibuprofen (ADVIL,MOTRIN) 800 MG tablet Take 1 tablet (  800 mg total) by mouth every 8 (eight) hours as needed. 02/13/15   Golden Circle, FNP  Ipratropium-Albuterol (COMBIVENT) 20-100 MCG/ACT AERS respimat Inhale 1 puff into the lungs every 6 (six) hours as needed for wheezing. 11/28/14   Golden Circle, FNP  metoprolol (LOPRESSOR) 50 MG tablet Take 1 tablet (50 mg total) by mouth 2 (two) times daily. 01/29/15   Golden Circle, FNP  Multiple Vitamin (MULITIVITAMIN WITH MINERALS) TABS Take 1 tablet by mouth daily.    Historical Provider, MD   BP 178/102 mmHg  Pulse 61  Temp(Src) 98.2 F (36.8 C) (Oral)  Resp 14  SpO2  100% Physical Exam  Constitutional: She is oriented to person, place, and time. She appears well-developed and well-nourished.  HENT:  Head: Normocephalic and atraumatic.  Right Ear: External ear normal.  Left Ear: External ear normal.  Nose: Nose normal.  Eyes: EOM are normal. Pupils are equal, round, and reactive to light. Right eye exhibits no discharge. Left eye exhibits no discharge.  Neck: Neck supple. Muscular tenderness present.    Cardiovascular: Normal rate, regular rhythm and normal heart sounds.   Pulmonary/Chest: Effort normal and breath sounds normal.  Abdominal: Soft. There is no tenderness.  Neurological: She is alert and oriented to person, place, and time.  CN 2-12 grossly intact. 5/5 strength in all 4 extremities.  Skin: Skin is warm and dry.  Vitals reviewed.   ED Course  Procedures (including critical care time) Labs Review Labs Reviewed  BASIC METABOLIC PANEL - Abnormal; Notable for the following:    GFR calc non Af Amer 81 (*)    All other components within normal limits  CBC WITH DIFFERENTIAL/PLATELET    Imaging Review Ct Head Wo Contrast  02/25/2015   CLINICAL DATA:  Headache.  Slurred speech.  EXAM: CT HEAD WITHOUT CONTRAST  TECHNIQUE: Contiguous axial images were obtained from the base of the skull through the vertex without intravenous contrast.  COMPARISON:  06/04/2014.  FINDINGS: No mass lesion, mass effect, midline shift, hydrocephalus, hemorrhage. No territorial ischemia or acute infarction.  IMPRESSION: Negative CT head.   Electronically Signed   By: Dereck Ligas M.D.   On: 02/25/2015 16:14   Mr Brain Wo Contrast  02/25/2015   CLINICAL DATA:  54 year old hypertensive female with history of lupus presenting with acute onset of headaches, confusion and slurred speech. Initial encounter.  EXAM: MRI HEAD WITHOUT CONTRAST  TECHNIQUE: Multiplanar, multiecho pulse sequences of the brain and surrounding structures were obtained without intravenous  contrast.  COMPARISON:  02/25/2015 CT.  12/07/2004 MR brain.  FINDINGS: Exam is motion degraded.  No acute infarct.  No intracranial hemorrhage.  No hydrocephalus.  No intracranial mass lesion noted on this unenhanced exam.  Minimal linear periventricular white matter hyperintensity greater on the right accentuated by motion of questionable significance.  Cervical medullary junction, pituitary region, pineal region and orbital structures unremarkable.  Small left vertebral artery once again noted. Major intracranial vascular structures are patent.  Mild spinal stenosis upper cervical spine.  IMPRESSION: No acute abnormality.  Please see above.   Electronically Signed   By: Genia Del M.D.   On: 02/25/2015 20:31     EKG Interpretation None      MDM   Final diagnoses:  Headache, unspecified headache type    Patient with a nonspecific gradual onset headache over the last couple hours. Given her transient slurred speech and confusion, a CT and basic blood work was obtained. These are unremarkable. Consulted  neurology given the transient slurred speech recommends MRI. Given MRI is negative the recommendation from nervousness for discharge and follow-up with PCP for outpatient workup. Headache improved after IV medicine. Neuro exam normal. Given that CT was obtained within a couple hours of headache onset with atypical story have very low suspicion for several hemorrhage or other acute intracranial emergency.    Sherwood Gambler, MD 02/26/15 0001

## 2015-02-25 NOTE — Telephone Encounter (Signed)
Please call. Patient is in hospital and son would like to talk with you.

## 2015-02-25 NOTE — ED Notes (Signed)
Pt alert but drowsy. Son with patient states someone will be staying with patient tonight. Respirations easy non labored.

## 2015-02-25 NOTE — Consult Note (Addendum)
NEURO HOSPITALIST CONSULT NOTE   Refering physician: Dr. Regenia Skeeter  Reason for Consult: HA, confusion  HPI:                                                                                                                                          [name] is an 54 y.o. female with a past medical history significant for HTN, SLE, asthma, low back pain s/p spine surgery, and chronic HA, comes in for further evaluation of the above stated symptoms. She refers that she will get " may be 2 severe incapacitating HA with nausea and sensitivity to light every month" but this morning the HA was " different and odd". [name] said that she left the house with a HA that was starting to get bad and as soon as she arrived to the grocery store the HA started worsening, located in the top and back of the head, and she felt that " she was in a dreaming state, like I was not there, my head was draining". She was able to understand people talking to her but she was confused. Did not recall having focal weakness, numbness-tingling, imbalance, slurred speech, or vertigo. Stated that she drove home but still with a HA. No recent fever, infection, head or neck trauma. Usually takes ibuprofen for " severe HA".C CT brain was personally reviewed and showed no acute abnormality.  Past Medical History  Diagnosis Date  . Hypertension   . Edema   . Pain in limb   . Back pain   . Asthma   . MVP (mitral valve prolapse) 12/22/2011  . S/P dilatation of esophageal stricture 12/22/2011  . Lupus (systemic lupus erythematosus) 01/29/2015    Past Surgical History  Procedure Laterality Date  . Abdominal hysterectomy    . Cardiac catheterization    . Tonsillectomy    . Spine surgery      Family History  Problem Relation Age of Onset  . Diabetes Mother   . Hypertension Mother   . Heart disease Mother   . Diabetes Father   . Hypertension Father   . COPD Father   . Heart attack Father 2  .  Stroke Other   . Coronary artery disease Other     Family History: no brain tumors, epilepsy, or brain aneurysms.   Social History:  reports that she has never smoked. She has never used smokeless tobacco. She reports that she does not drink alcohol or use illicit drugs.  Allergies  Allergen Reactions  . Morphine And Related Other (See Comments)    hallucinations  . Pantoprazole Sodium Other (See Comments)    blisters  . Prednisone Other (See Comments)    Blisters on tongue    MEDICATIONS:  I have reviewed the patient's current medications.   ROS:                                                                                                                                       History obtained from the patient and chart review.  General ROS: negative for - chills, fatigue, fever, night sweats, weight gain or weight loss Psychological ROS: negative for - behavioral disorder, hallucinations, memory difficulties, mood swings or suicidal ideation Ophthalmic ROS: negative for - blurry vision, double vision, eye pain or loss of vision ENT ROS: negative for - epistaxis, nasal discharge, oral lesions, sore throat, tinnitus or vertigo Allergy and Immunology ROS: negative for - hives or itchy/watery eyes Hematological and Lymphatic ROS: negative for - bleeding problems, bruising or swollen lymph nodes Endocrine ROS: negative for - galactorrhea, hair pattern changes, polydipsia/polyuria or temperature intolerance Respiratory ROS: negative for - cough, hemoptysis, shortness of breath or wheezing Cardiovascular ROS: negative for - chest pain, dyspnea on exertion, edema or irregular heartbeat Gastrointestinal ROS: negative for - abdominal pain, diarrhea, hematemesis, nausea/vomiting or stool incontinence Genito-Urinary ROS: negative for - dysuria, hematuria, incontinence or  urinary frequency/urgency Musculoskeletal ROS: negative for - joint swelling or muscular weakness Neurological ROS: as noted in HPI Dermatological ROS: negative for rash and skin lesion changes   Physical exam: pleasant female in no apparent distress. Blood pressure 178/102, pulse 61, temperature 98.2 F (36.8 C), temperature source Oral, resp. rate 14, SpO2 100 %. Head: normocephalic. Neck: supple, no bruits, no JVD. Cardiac: no murmurs. Lungs: clear. Abdomen: soft, no tender, no mass. Extremities: mild left>right pitting edema but no clubbing or cyanosis. CV: pulses palpable throughout  Skin: no rash  Neurologic Examination:                                                                                                      General: Mental Status: Alert, oriented, thought content appropriate.  Speech fluent without evidence of aphasia.  Able to follow 3 step commands without difficulty. Cranial Nerves: II: Discs flat bilaterally; Visual fields grossly normal, pupils equal, round, reactive to light and accommodation III,IV, VI: ptosis not present, extra-ocular motions intact bilaterally V,VII: smile symmetric, facial light touch sensation normal bilaterally VIII: hearing normal bilaterally IX,X: uvula rises symmetrically XI: bilateral shoulder shrug XII: midline tongue extension without atrophy or fasciculations  Motor: Significant for mild left arm weakness, although there is some pain involved. Tone and bulk:normal tone throughout; no atrophy noted Sensory:  Pinprick and light touch intact throughout, bilaterally Deep Tendon Reflexes:  Right: Upper Extremity   Left: Upper extremity   biceps (C-5 to C-6) 2/4   biceps (C-5 to C-6) 2/4 tricep (C7) 2/4    triceps (C7) 2/4 Brachioradialis (C6) 2/4  Brachioradialis (C6) 2/4  Lower Extremity Lower Extremity  quadriceps (L-2 to L-4) 2/4   quadriceps (L-2 to L-4) 2/4 Achilles (S1) 2/4   Achilles (S1) 2/4  Plantars: Right:  downgoing   Left: downgoing Cerebellar: normal finger-to-nose,  normal heel-to-shin test Gait:  No ataxia    Lab Results  Component Value Date/Time   CHOL 148 12/23/2011 01:45 AM    Results for orders placed or performed during the hospital encounter of 02/25/15 (from the past 48 hour(s))  Basic metabolic panel     Status: Abnormal   Collection Time: 02/25/15  4:19 PM  Result Value Ref Range   Sodium 140 135 - 145 mmol/L   Potassium 3.9 3.5 - 5.1 mmol/L   Chloride 101 96 - 112 mmol/L   CO2 30 19 - 32 mmol/L   Glucose, Bld 86 70 - 99 mg/dL   BUN 10 6 - 23 mg/dL   Creatinine, Ser 0.81 0.50 - 1.10 mg/dL   Calcium 9.4 8.4 - 10.5 mg/dL   GFR calc non Af Amer 81 (L) >90 mL/min   GFR calc Af Amer >90 >90 mL/min    Comment: (NOTE) The eGFR has been calculated using the CKD EPI equation. This calculation has not been validated in all clinical situations. eGFR's persistently <90 mL/min signify possible Chronic Kidney Disease.    Anion gap 9 5 - 15  CBC with Differential     Status: None   Collection Time: 02/25/15  4:19 PM  Result Value Ref Range   WBC 6.1 4.0 - 10.5 K/uL   RBC 4.22 3.87 - 5.11 MIL/uL   Hemoglobin 12.6 12.0 - 15.0 g/dL   HCT 38.3 36.0 - 46.0 %   MCV 90.8 78.0 - 100.0 fL   MCH 29.9 26.0 - 34.0 pg   MCHC 32.9 30.0 - 36.0 g/dL   RDW 12.6 11.5 - 15.5 %   Platelets 177 150 - 400 K/uL   Neutrophils Relative % 57 43 - 77 %   Neutro Abs 3.5 1.7 - 7.7 K/uL   Lymphocytes Relative 33 12 - 46 %   Lymphs Abs 2.0 0.7 - 4.0 K/uL   Monocytes Relative 6 3 - 12 %   Monocytes Absolute 0.4 0.1 - 1.0 K/uL   Eosinophils Relative 3 0 - 5 %   Eosinophils Absolute 0.2 0.0 - 0.7 K/uL   Basophils Relative 1 0 - 1 %   Basophils Absolute 0.0 0.0 - 0.1 K/uL    Ct Head Wo Contrast  02/25/2015   CLINICAL DATA:  Headache.  Slurred speech.  EXAM: CT HEAD WITHOUT CONTRAST  TECHNIQUE: Contiguous axial images were obtained from the base of the skull through the vertex without intravenous  contrast.  COMPARISON:  06/04/2014.  FINDINGS: No mass lesion, mass effect, midline shift, hydrocephalus, hemorrhage. No territorial ischemia or acute infarction.  IMPRESSION: Negative CT head.   Electronically Signed   By: Dereck Ligas M.D.   On: 02/25/2015 16:14   Assessment/Plan: 54 y.o. 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 Currently asymptomatic from a neuro standpoint but questionable left pronator drift. Still think this presentation most likely migraine related, but the left arm weakness must be further addressed with MRI brain to exclude the possibility of structural lesion. If MRI negative, discharge home with outpatient neurology follow up.  Dorian Pod, MD 02/25/2015, 5:11 PM  [facility].

## 2015-02-25 NOTE — ED Notes (Signed)
Awake. Verbally responsive. A/O x4. Resp even and unlabored. No audible adventitious breath sounds noted. ABC's intact. Family at bedside. 

## 2015-02-25 NOTE — ED Notes (Signed)
Attempted IV venipuncture and pt became easily upset. Will get another nurse to attempt.

## 2015-02-27 ENCOUNTER — Ambulatory Visit: Payer: BLUE CROSS/BLUE SHIELD | Admitting: Family

## 2015-02-27 ENCOUNTER — Telehealth: Payer: Self-pay | Admitting: Family

## 2015-02-27 DIAGNOSIS — Z0289 Encounter for other administrative examinations: Secondary | ICD-10-CM

## 2015-02-27 NOTE — Telephone Encounter (Signed)
Patient no showed for follow up today.  Please advise.

## 2015-02-28 NOTE — Telephone Encounter (Signed)
May reschedule if she calls back.

## 2015-02-28 NOTE — Telephone Encounter (Signed)
Noted  

## 2015-03-01 ENCOUNTER — Other Ambulatory Visit: Payer: Self-pay | Admitting: Internal Medicine

## 2015-03-01 DIAGNOSIS — Z1231 Encounter for screening mammogram for malignant neoplasm of breast: Secondary | ICD-10-CM

## 2015-03-06 ENCOUNTER — Telehealth: Payer: Self-pay | Admitting: Family

## 2015-03-06 DIAGNOSIS — I10 Essential (primary) hypertension: Secondary | ICD-10-CM

## 2015-03-06 MED ORDER — FUROSEMIDE 40 MG PO TABS: 40.0000 mg | ORAL_TABLET | Freq: Every day | ORAL | Status: DC

## 2015-03-06 NOTE — Telephone Encounter (Signed)
Called pt no answer LMOm rx sent to [facility]...[name]

## 2015-03-06 NOTE — Telephone Encounter (Signed)
Patient needs refill for furosemide (LASIX) 40 MG tablet [[id number]. Pharmacy is [facility] [address]

## 2015-03-07 ENCOUNTER — Ambulatory Visit: Payer: BLUE CROSS/BLUE SHIELD

## 2015-03-07 ENCOUNTER — Ambulatory Visit (INDEPENDENT_AMBULATORY_CARE_PROVIDER_SITE_OTHER): Payer: BLUE CROSS/BLUE SHIELD | Admitting: Family

## 2015-03-07 ENCOUNTER — Encounter: Payer: Self-pay | Admitting: Family

## 2015-03-07 VITALS — BP 158/98 | HR 56 | Temp 97.8°F | Resp 18 | Ht 67.0 in | Wt 210.0 lb

## 2015-03-07 DIAGNOSIS — I10 Essential (primary) hypertension: Secondary | ICD-10-CM | POA: Diagnosis not present

## 2015-03-07 DIAGNOSIS — M329 Systemic lupus erythematosus, unspecified: Secondary | ICD-10-CM

## 2015-03-07 MED ORDER — HYDROXYCHLOROQUINE SULFATE 200 MG PO TABS: 200.0000 mg | ORAL_TABLET | Freq: Every day | ORAL | Status: DC

## 2015-03-07 MED ORDER — ENALAPRIL MALEATE 10 MG PO TABS: 10.0000 mg | ORAL_TABLET | Freq: Every day | ORAL | Status: DC

## 2015-03-07 NOTE — Patient Instructions (Addendum)
STOP taking the METOPROLOL and START taking the ENALAPRIL.  Thank you for choosing [facility].  Summary/Instructions:  Your prescription(s) have been submitted to your pharmacy or been printed and provided for you. Please take as directed and contact our office if you believe you are having problem(s) with the medication(s) or have any questions.  If your symptoms worsen or fail to improve, please contact our office for further instruction, or in case of emergency go directly to the emergency room at the closest medical facility.   Bupropion; Naltrexone extended-release tablets What is this medicine? BUPROPION; NALTREXONE (byoo PROE pee on; nal TREX one) is a combination product used to promote and maintain weight loss in obese adults or overweight adults who also have weight related medical problems. This medicine should be used with a reduced calorie diet and increased physical activity. This medicine may be used for other purposes; ask your health care provider or pharmacist if you have questions. COMMON BRAND NAME(S): CONTRAVE What should I tell my health care provider before I take this medicine? They need to know if you have any of these conditions: -an eating disorder, such as anorexia or bulimia -diabetes -glaucoma -head injury -heart disease -high blood pressure -history of a drug or alcohol abuse problem -history of a tumor or infection of your brain or spine -history of stroke -history of irregular heartbeat -kidney disease -liver disease -mental illness such as bipolar disorder or psychosis -seizures -suicidal thoughts, plans, or attempt; a previous suicide attempt by you or a family member -an unusual or allergic reaction to bupropion, naltrexone, other medicines, foods, dyes, or preservatives breast-feeding -pregnant or trying to become pregnant How should I use this medicine? Take this medicine by mouth with a glass of water. Follow the directions on the  prescription label. Take this medicine in the morning and in the evenings as directed by your healthcare professional. Dennis Bast can take it with or without food. Do not take with high-fat meals as this may increase your risk of seizures. Do not crush, chew, or cut these tablets. Do not take your medicine more often than directed. Do not stop taking this medicine suddenly except upon the advice of your doctor. A special MedGuide will be given to you by the pharmacist with each prescription and refill. Be sure to read this information carefully each time. Talk to your pediatrician regarding the use of this medicine in children. Special care may be needed. Overdosage: If you think you've taken too much of this medicine contact a poison control center or emergency room at once. Overdosage: If you think you have taken too much of this medicine contact a poison control center or emergency room at once. NOTE: This medicine is only for you. Do not share this medicine with others. What if I miss a dose? If you miss a dose, skip the missed dose and take your next tablet at the regular time. Do not take double or extra doses. What may interact with this medicine? Do not take this medicine with any of the following medications: -any prescription or street opioid drug like codiene, heroin, methadone -linezolid -MAOIs like Carbex, Eldepryl, Marplan, Nardil, and Parnate -methylene blue (injected into a vein) -other medicines that contain bupropion like Zyban or Wellbutrin This medicine may also interact with the following medications: -alcohol -certain medicines for anxiety or sleep -certain medicines for blood pressure like metoprolol, propranolol -certain medicines for depression or psychotic disturbances -certain medicines for HIV or AIDS like efavirenz, lopinavir, nelfinavir, ritonavir -  certain medicines for irregular heart beat like propafenone, flecainide -certain medicines for Parkinson's disease like  amantadine, levodopa -certain medicines for seizures like carbamazepine, phenytoin, phenobarbital -cimetidine -clopidogrel -cyclophosphamide -disulfiram -furazolidone -isoniazid -nicotine -orphenadrine -procarbazine -steroid medicines like prednisone or cortisone -stimulant medicines for attention disorders, weight loss, or to stay awake -tamoxifen -theophylline -thioridazine -thiotepa -ticlopidine -tramadol -warfarin This list may not describe all possible interactions. Give your health care provider a list of all the medicines, herbs, non-prescription drugs, or dietary supplements you use. Also tell them if you smoke, drink alcohol, or use illegal drugs. Some items may interact with your medicine. What should I watch for while using this medicine? This medicine is intended to be used in addition to a healthy diet and appropriate exercise. The best results are achieved this way. Do not increase or in any way change your dose without consulting your doctor or health care professional. Do not take this medicine with other prescription or over-the-counter weight loss products without consulting your doctor or health care professional. Your doctor should tell you to stop taking this medicine if you do not lose a certain amount of weight within the first 12 weeks of treatment. Visit your doctor or health care professional for regular checkups. Your doctor may order blood tests or other tests to see how you are doing. This medicine may affect blood sugar levels. If you have diabetes, check with your doctor or health care professional before you change your diet or the dose of your diabetic medicine. Patients and their families should watch out for new or worsening depression or thoughts of suicide. Also watch out for sudden changes in feelings such as feeling anxious, agitated, panicky, irritable, hostile, aggressive, impulsive, severely restless, overly excited and hyperactive, or not being able  to sleep. If this happens, especially at the beginning of treatment or after a change in dose, call your health care professional. Avoid alcoholic drinks while taking this medicine. Drinking large amounts of alcoholic beverages, using sleeping or anxiety medicines, or quickly stopping the use of these agents while taking this medicine may increase your risk for a seizure. What side effects may I notice from receiving this medicine? Side effects that you should report to your doctor or health care professional as soon as possible: -allergic reactions like skin rash, itching or hives, swelling of the face, lips, or tongue -breathing problems -changes in vision, hearing -chest pain -confusion -dark urine -depressed mood -fast or irregular heart beat -fever -hallucination, loss of contact with reality -increased blood pressure -light-colored stools -redness, blistering, peeling or loosening of the skin, including inside the mouth -right upper belly pain -seizures -suicidal thoughts or other mood changes -unusually weak or tired -vomiting -yellowing of the eyes or skin Side effects that usually do not require medical attention (Report these to your doctor or health care professional if they continue or are bothersome.): -constipation -diarrhea -dizziness -dry mouth -headache -nausea -trouble sleeping This list may not describe all possible side effects. Call your doctor for medical advice about side effects. You may report side effects to FDA at 1-800-FDA-1088. Where should I keep my medicine? Keep out of the reach of children. Store at room temperature between 15 and 30 degrees C (59 and 86 degrees F). Throw away any unused medicine after the expiration date. NOTE: This sheet is a summary. It may not cover all possible information. If you have questions about this medicine, talk to your doctor, pharmacist, or health care provider.  2015, Elsevier/Gold Standard. (2013-07-26  15:17:29)   

## 2015-03-07 NOTE — Assessment & Plan Note (Signed)
Discontinue metoprolol per patient request. Restart enalapril. Continue to monitor blood pressure at home. Elevated readings most likely related to anxiety and pain. Once these are improved, it is likely blood pressure will improve as well.

## 2015-03-07 NOTE — Assessment & Plan Note (Signed)
Decrease Plaquenil to 200 mg daily. Discussed potential pain management with antidepressive medications while awaiting rheumatology appointment. Patient will consider medications.

## 2015-03-07 NOTE — Progress Notes (Signed)
Subjective:    Patient ID: [name], female    DOB: [birth year], 54 y.o.   MRN: [id number]  Chief Complaint  Patient presents with  . Follow-up    the lupus medication was making her swell, gaining weight and says its affecting her sciatic nerve, has not slept in 3 nights, not feeling better still having pain all over body,    HPI:  [name] is a 54 y.o. female with a PMH of hypertension, lupus, mitral valve prolapse, and hyperlipidemia who presents today for an office follow up after being seen in the ED.   Recently seen in the emergency room for sudden onset headache and feelings of being in a dream state.   Blood work, CT scan, and MRI were unremarkable. Her neurological exam was also normal. She was discharged with atypical headache.  1) Headaches - Since that time she continues to experience the associated symptom of a headache indicating that some days are good and some days are bad. Location of headache is on the tip of her head and hurts in the back of her head. Previous CT and MRI results were negative for intracranial pathology  2) Lupus - She was recently started on the Plaquil for her Lupus. She was scehduled to see rhuematology however secondary to financial issues, she was not able to see anyone. She continues to experience the body pain and increased back surgery. Notes that she has not slept in 3 days.   Allergies  Allergen Reactions  . Morphine And Related Other (See Comments)    hallucinations  . Pantoprazole Sodium Other (See Comments)    blisters  . Prednisone Other (See Comments)    Blisters on tongue    Current Outpatient Prescriptions on File Prior to Visit  Medication Sig Dispense Refill  . albuterol (PROVENTIL HFA;VENTOLIN HFA) 108 (90 BASE) MCG/ACT inhaler Inhale 2 puffs into the lungs every 6 (six) hours as needed for wheezing or shortness of breath. 1 Inhaler 3  . ALPRAZolam (XANAX) 0.25 MG tablet Take 1 tablet (0.25 mg total) by mouth 2 (two)  times daily as needed for anxiety. 20 tablet 0  . aspirin 81 MG tablet Take 81 mg by mouth daily.    Marland Kitchen aspirin EC 325 MG tablet Take 1 tablet (325 mg total) by mouth daily. 30 tablet 0  . Fish Oil-Cholecalciferol (FISH OIL/D3 ADULT GUMMIES PO) Take 1 tablet by mouth daily.    . furosemide (LASIX) 40 MG tablet Take 1 tablet (40 mg total) by mouth daily. 30 tablet 5  . hydroxychloroquine (PLAQUENIL) 200 MG tablet Take 2 tablets (400 mg total) by mouth daily. 60 tablet 0  . ibuprofen (ADVIL,MOTRIN) 800 MG tablet Take 1 tablet (800 mg total) by mouth every 8 (eight) hours as needed. 90 tablet 0  . Ipratropium-Albuterol (COMBIVENT) 20-100 MCG/ACT AERS respimat Inhale 1 puff into the lungs every 6 (six) hours as needed for wheezing. 1 Inhaler 2  . metoprolol (LOPRESSOR) 50 MG tablet Take 1 tablet (50 mg total) by mouth 2 (two) times daily. 60 tablet 0   No current facility-administered medications on file prior to visit.    Review of Systems  Constitutional: Positive for unexpected weight change. Negative for fever and chills.  Respiratory: Negative for chest tightness and shortness of breath.   Cardiovascular: Negative for chest pain, palpitations and leg swelling.  Neurological: Positive for headaches.      Objective:    BP 158/98 mmHg  Pulse 56  Temp(Src) 97.8 F (36.6 C) (Oral)  Resp 18  Ht 5\' 7"  (1.702 m)  Wt 210 lb (95.255 kg)  BMI 32.88 kg/m2  SpO2 98% Nursing note and vital signs reviewed.  Physical Exam  Constitutional: She is oriented to person, place, and time. She appears well-developed and well-nourished. No distress.  Cardiovascular: Normal rate, regular rhythm, normal heart sounds and intact distal pulses.   Pulmonary/Chest: Effort normal and breath sounds normal.  Neurological: She is alert and oriented to person, place, and time.  Skin: Skin is warm and dry.  Psychiatric: She has a normal mood and affect. Her behavior is normal. Judgment and thought content normal.         Assessment & Plan:

## 2015-03-07 NOTE — Progress Notes (Signed)
Pre visit review using our clinic review tool, if applicable. No additional management support is needed unless otherwise documented below in the visit note. 

## 2015-03-11 ENCOUNTER — Encounter: Payer: Self-pay | Admitting: Family

## 2015-03-11 ENCOUNTER — Other Ambulatory Visit: Payer: Self-pay

## 2015-03-11 ENCOUNTER — Ambulatory Visit
Admission: RE | Admit: 2015-03-11 | Discharge: 2015-03-11 | Disposition: A | Discharge status: Home / Self Care | Payer: BLUE CROSS/BLUE SHIELD | Source: Ambulatory Visit | Attending: Internal Medicine | Admitting: Internal Medicine

## 2015-03-11 DIAGNOSIS — Z1231 Encounter for screening mammogram for malignant neoplasm of breast: Secondary | ICD-10-CM

## 2015-03-12 ENCOUNTER — Telehealth: Payer: Self-pay | Admitting: Family

## 2015-03-12 ENCOUNTER — Encounter: Payer: Self-pay | Admitting: Family

## 2015-03-12 NOTE — Telephone Encounter (Signed)
Patient is requesting script for contrave to be sent to [facility] on [address].

## 2015-03-14 ENCOUNTER — Ambulatory Visit: Payer: BLUE CROSS/BLUE SHIELD | Admitting: Family

## 2015-03-14 DIAGNOSIS — Z0289 Encounter for other administrative examinations: Secondary | ICD-10-CM

## 2015-03-15 ENCOUNTER — Ambulatory Visit (INDEPENDENT_AMBULATORY_CARE_PROVIDER_SITE_OTHER): Payer: BLUE CROSS/BLUE SHIELD | Admitting: Internal Medicine

## 2015-03-15 ENCOUNTER — Encounter: Payer: Self-pay | Admitting: Internal Medicine

## 2015-03-15 VITALS — BP 154/100 | HR 81 | Temp 98.4°F | Resp 15 | Ht 67.0 in | Wt 213.5 lb

## 2015-03-15 DIAGNOSIS — G894 Chronic pain syndrome: Secondary | ICD-10-CM

## 2015-03-15 DIAGNOSIS — I10 Essential (primary) hypertension: Secondary | ICD-10-CM | POA: Diagnosis not present

## 2015-03-15 DIAGNOSIS — R635 Abnormal weight gain: Secondary | ICD-10-CM | POA: Diagnosis not present

## 2015-03-15 DIAGNOSIS — R768 Other specified abnormal immunological findings in serum: Secondary | ICD-10-CM | POA: Diagnosis not present

## 2015-03-15 MED ORDER — GABAPENTIN 100 MG PO CAPS: ORAL_CAPSULE | ORAL | Status: DC

## 2015-03-15 NOTE — Patient Instructions (Addendum)
Eat a low-fat diet with lots of fruits and vegetables, up to 7-9 servings per day. Consume less than 30 Grams (preferably ZERO) of sugar per day from foods & drinks with High Fructose Corn Syrup (HFCS) sugar as #1,2,3 or # 4 on label.[employer], [employer] do not carry products with HFCS. White carbohydrates (potatoes, rice, bread, and pasta) cause a high spike of the sugar level which stays elevated for a significant period of time (called sugar"load").  For example a  baked potato has a cup of sugar and a  french fry  2 teaspoons of sugar.  More complex carbs such as yams, wild  rice, whole grained bread &  wheat pasta have been much lower spike and persistent load of sugar than the white carbs.  Minimal Blood Pressure Goal= AVERAGE < 140/90;  Ideal is an AVERAGE < 135/85. This AVERAGE should be calculated from @ least 5-7 BP readings taken @ different times of day on different days of week. You should not respond to isolated BP readings , but rather the AVERAGE for that week .Please bring your  blood pressure cuff to office visits to verify that it is reliable.It  can also be checked against the blood pressure device at the pharmacy. Finger or wrist cuffs are not dependable; an arm cuff is.  Assess response to the gabapentin one every 8 hours as needed. If it is partially beneficial, it can be increased up to a total of 3 pills every 8 hours as needed. This increase of 1 pill each dose  should take place over 72 hours at least.If 300 mg is effective dose ; there is a 300 mg pill.

## 2015-03-15 NOTE — Progress Notes (Signed)
   Subjective:    Patient ID: [name], female    DOB: Jun 30, 1961, 54 y.o.   MRN: [id number]  HPI  She is here concerned mainly about her weight gain. This is in  the context of poorly controlled hypertension and an extremely high ANA titer of 1:320.  In August 2015 she was evaluated at [facility] and found to have the abnormal ANA. Apparently she did see a Rheumatologist but was not pleased with the interaction. Although it's been 9 months she still as yet has not followed up with another Rheumatologist. When I asked about this delay in follow up of care; she stated: "Don't blame the patient!" Apparently she was to see Dr. Charlestine Night but there was some financial issue " he wanted more than the co pay" prior to seeing her. She is pursuing seeing a Rheumatologist in [place]  She was on metoprolol 50 mg twice a day but stopped this concerned that it might be contributing to her weight gain. Her blood pressures range from 145/95-180/115.  At this time she's only taking enalapril 10 mg daily for HTN.  She has persistent swelling of the left foot. Her renal function tests have been normal.Last LFTs in 2014.  She states that she's gained 15 pounds in 30 days. Her thyroid function tests are also normal. She describes diffuse pain in her hands, feet, neck and essentially generalized fashion.  Review of Systems   Chest pain, palpitations, tachycardia, exertional dyspnea, paroxysmal nocturnal dyspnea,or claudication are absent. No new rashes.       Objective:   Physical Exam Pertinent or positive findings include: She has an upper partial.  Crepitus is present the knees without associated effusion. She does have visible and palpable edema of the left foot.  General appearance :adequately nourished; in no distress. Eyes: No conjunctival inflammation or scleral icterus is present. Oral exam:  Lips and gums are healthy appearing.There is no oropharyngeal erythema or exudate noted. Dental hygiene  is good. Heart:  Normal rate and regular rhythm. S1 and S2 normal without gallop, murmur, click, rub or other extra sounds   Lungs:Chest clear to auscultation; no wheezes, rhonchi,rales ,or rubs present.No increased work of breathing.  Abdomen: bowel sounds normal, soft and non-tender without masses, organomegaly or hernias noted.  No guarding or rebound.  Vascular : all pulses equal ; no bruits present. Skin:Warm & dry.  Intact without suspicious lesions or rashes ; no tenting . Small area of hyperpigmentation L face. Lymphatic: No lymphadenopathy is noted about the head, neck, axilla Neuro: Strength, tone & DTRs normal. Pysch:Intermittently expressing frustration and anger with use of profanity.She seemed to be more concerned about her weight gain rather than risk of uncontrolled HTN(risk discussed) and possible untreated collagen vascular disease state          Assessment & Plan:  #1 uncontrolled hypertension  #2 positive ANA  #3 weight gain with normal TFTs  #4 chronic pain  Plan: See orders recommendations

## 2015-03-15 NOTE — Progress Notes (Signed)
Pre visit review using our clinic review tool, if applicable. No additional management support is needed unless otherwise documented below in the visit note. 

## 2015-03-16 ENCOUNTER — Encounter: Payer: Self-pay | Admitting: Internal Medicine

## 2015-03-26 ENCOUNTER — Emergency Department (HOSPITAL_COMMUNITY)
Admission: EM | Admit: 2015-03-26 | Discharge: 2015-03-26 | Disposition: A | Discharge status: Home / Self Care | Payer: BLUE CROSS/BLUE SHIELD | Attending: Emergency Medicine | Admitting: Emergency Medicine

## 2015-03-26 ENCOUNTER — Encounter (HOSPITAL_COMMUNITY): Payer: Self-pay | Admitting: Emergency Medicine

## 2015-03-26 ENCOUNTER — Emergency Department (HOSPITAL_COMMUNITY): Payer: BLUE CROSS/BLUE SHIELD

## 2015-03-26 DIAGNOSIS — R51 Headache: Secondary | ICD-10-CM

## 2015-03-26 DIAGNOSIS — Z7982 Long term (current) use of aspirin: Secondary | ICD-10-CM | POA: Diagnosis not present

## 2015-03-26 DIAGNOSIS — R200 Anesthesia of skin: Secondary | ICD-10-CM | POA: Diagnosis not present

## 2015-03-26 DIAGNOSIS — I10 Essential (primary) hypertension: Secondary | ICD-10-CM | POA: Insufficient documentation

## 2015-03-26 DIAGNOSIS — R519 Headache, unspecified: Secondary | ICD-10-CM

## 2015-03-26 DIAGNOSIS — Z8739 Personal history of other diseases of the musculoskeletal system and connective tissue: Secondary | ICD-10-CM | POA: Insufficient documentation

## 2015-03-26 DIAGNOSIS — IMO0001 Reserved for inherently not codable concepts without codable children: Secondary | ICD-10-CM

## 2015-03-26 DIAGNOSIS — J45909 Unspecified asthma, uncomplicated: Secondary | ICD-10-CM | POA: Insufficient documentation

## 2015-03-26 DIAGNOSIS — Z79899 Other long term (current) drug therapy: Secondary | ICD-10-CM | POA: Diagnosis not present

## 2015-03-26 DIAGNOSIS — Z9889 Other specified postprocedural states: Secondary | ICD-10-CM | POA: Diagnosis not present

## 2015-03-26 DIAGNOSIS — R030 Elevated blood-pressure reading, without diagnosis of hypertension: Secondary | ICD-10-CM

## 2015-03-26 HISTORY — DX: Reserved for concepts with insufficient information to code with codable children: IMO0002

## 2015-03-26 HISTORY — DX: Systemic lupus erythematosus, unspecified: M32.9

## 2015-03-26 LAB — URINALYSIS, ROUTINE W REFLEX MICROSCOPIC
BILIRUBIN URINE: NEGATIVE
Glucose, UA: NEGATIVE mg/dL
Hgb urine dipstick: NEGATIVE
Ketones, ur: NEGATIVE mg/dL
LEUKOCYTES UA: NEGATIVE
NITRITE: NEGATIVE
PH: 7.5 (ref 5.0–8.0)
Protein, ur: NEGATIVE mg/dL
Specific Gravity, Urine: 1.009 (ref 1.005–1.030)
Urobilinogen, UA: 0.2 mg/dL (ref 0.0–1.0)

## 2015-03-26 LAB — COMPREHENSIVE METABOLIC PANEL WITH GFR
ALT: 13 U/L — ABNORMAL LOW (ref 14–54)
AST: 20 U/L (ref 15–41)
Albumin: 3.7 g/dL (ref 3.5–5.0)
Alkaline Phosphatase: 75 U/L (ref 38–126)
Anion gap: 9 (ref 5–15)
BUN: 7 mg/dL (ref 6–20)
CO2: 27 mmol/L (ref 22–32)
Calcium: 8.9 mg/dL (ref 8.9–10.3)
Chloride: 102 mmol/L (ref 101–111)
Creatinine, Ser: 0.81 mg/dL (ref 0.44–1.00)
GFR calc Af Amer: >60 mL/min
GFR calc non Af Amer: >60 mL/min
Glucose, Bld: 112 mg/dL — ABNORMAL HIGH (ref 65–99)
Potassium: 3.9 mmol/L (ref 3.5–5.1)
Sodium: 138 mmol/L (ref 135–145)
Total Bilirubin: 0.4 mg/dL (ref 0.3–1.2)
Total Protein: 6.5 g/dL (ref 6.5–8.1)

## 2015-03-26 LAB — CBC WITH DIFFERENTIAL/PLATELET
Basophils Absolute: 0 K/uL (ref 0.0–0.1)
Basophils Relative: 1 % (ref 0–1)
Eosinophils Absolute: 0.2 K/uL (ref 0.0–0.7)
Eosinophils Relative: 3 % (ref 0–5)
HCT: 35.8 % — ABNORMAL LOW (ref 36.0–46.0)
Hemoglobin: 11.6 g/dL — ABNORMAL LOW (ref 12.0–15.0)
Lymphocytes Relative: 33 % (ref 12–46)
Lymphs Abs: 1.8 K/uL (ref 0.7–4.0)
MCH: 29.4 pg (ref 26.0–34.0)
MCHC: 32.4 g/dL (ref 30.0–36.0)
MCV: 90.6 fL (ref 78.0–100.0)
Monocytes Absolute: 0.4 K/uL (ref 0.1–1.0)
Monocytes Relative: 7 % (ref 3–12)
Neutro Abs: 3 K/uL (ref 1.7–7.7)
Neutrophils Relative %: 56 % (ref 43–77)
Platelets: 172 K/uL (ref 150–400)
RBC: 3.95 MIL/uL (ref 3.87–5.11)
RDW: 12.9 % (ref 11.5–15.5)
WBC: 5.3 K/uL (ref 4.0–10.5)

## 2015-03-26 MED ORDER — DIPHENHYDRAMINE HCL 50 MG/ML IJ SOLN
25.0000 mg | Freq: Once | INTRAMUSCULAR | Status: AC
  Administered 2015-03-26: 25 mg via INTRAVENOUS
  Filled 2015-03-26: qty 1

## 2015-03-26 MED ORDER — METOCLOPRAMIDE HCL 5 MG/ML IJ SOLN
10.0000 mg | Freq: Once | INTRAMUSCULAR | Status: AC
  Administered 2015-03-26: 10 mg via INTRAVENOUS
  Filled 2015-03-26: qty 2

## 2015-03-26 MED ORDER — SODIUM CHLORIDE 0.9 % IV BOLUS (SEPSIS)
1000.0000 mL | Freq: Once | INTRAVENOUS | Status: AC
Start: 2015-03-26 — End: 2015-03-26
  Administered 2015-03-26: 1000 mL via INTRAVENOUS

## 2015-03-26 NOTE — ED Notes (Addendum)
Pt sts needs to urinate and does not want to use bed pan. RN assisted pt to restroom, pt denies lightheaded or dizziness and requesting to use restroom by self. Pt had steady gait. Returned to room and hooked up to monitor

## 2015-03-26 NOTE — ED Notes (Signed)
MD Yao at the bedside.  

## 2015-03-26 NOTE — ED Provider Notes (Signed)
CSN: [id number]     Arrival date & time 03/26/15  0555 History   First MD Initiated Contact with Patient 03/26/15 [phone]     Chief Complaint  Patient presents with  . Headache     (Consider location/radiation/quality/duration/timing/severity/associated sxs/prior Treatment) The history is provided by the patient. No language interpreter was used.  [name] is a 54 y/o F with PMHx of HTN, edema, asthma, back pain, MVP, asthma, lupus, s/p hysterectomy 15 years ago presenting to the ED with headache that started this morning at approximately 1:30 AM. Patient reported that she took her Gabapentin last night prior to going to bed at 8:30PM last evening - reported that she was started on gabapentin 3 days ago, but stated that he has taken this in the past. Patient reported that the headache started when she woke up 1:30 AM - stated that the headache has gotten progressive worse since this morning. Reported that the headache is localized to the frontal aspect of the head described as a sharp squeezing pain that is constant without radiation. Patient reported that she has been having nausea, photophobia, phonophobia. Stated that when she woke up this morning she had numbness and weakness in the left arm and numbness in the left leg that has now resolved. Reported that she's been using ibuprofen. Denied blood thinners, head injury, neck pain, neck stiffness, fever, chills, vomiting, worsening headache of life, travels, fainting, shortness of breath, difficulty breathing, difficulty swallowing. PCP Dr. Elna Breslow    Past Medical History  Diagnosis Date  . Hypertension   . Edema   . Pain in limb   . Back pain   . Asthma   . MVP (mitral valve prolapse) 12/22/2011  . S/P dilatation of esophageal stricture 12/22/2011  . Positive ANA (antinuclear antibody) 01/29/2015  . Lupus    Past Surgical History  Procedure Laterality Date  . Abdominal hysterectomy    . Cardiac catheterization    . Tonsillectomy    .  Spine surgery     Family History  Problem Relation Age of Onset  . Diabetes Mother   . Hypertension Mother   . Heart disease Mother   . Diabetes Father   . Hypertension Father   . COPD Father   . Heart attack Father 52  . Stroke Other   . Coronary artery disease Other    History  Substance Use Topics  . Smoking status: Never Smoker   . Smokeless tobacco: Never Used  . Alcohol Use: No   OB History    No data available     Review of Systems  Constitutional: Negative for fever and chills.  HENT: Negative for trouble swallowing.   Eyes: Positive for photophobia and visual disturbance.  Respiratory: Negative for chest tightness and shortness of breath.   Cardiovascular: Negative for chest pain.  Gastrointestinal: Positive for nausea. Negative for vomiting.  Musculoskeletal: Negative for neck pain and neck stiffness.  Neurological: Positive for weakness, numbness and headaches. Negative for dizziness.      Allergies  Morphine and related; Pantoprazole sodium; and Prednisone  Home Medications   Prior to Admission medications   Medication Sig Start Date End Date Taking? Authorizing Provider  enalapril (VASOTEC) 10 MG tablet Take 10 mg by mouth 2 (two) times daily.  03/15/15  Yes Hendricks Limes, MD  gabapentin (NEURONTIN) 100 MG capsule One pill every eight hours as needed; dose may be increased by one pill each dose after 72 hours if only partially effective Patient  taking differently: Take 100 mg by mouth every 8 (eight) hours as needed (for pain).  03/15/15  Yes Hendricks Limes, MD  ibuprofen (ADVIL,MOTRIN) 800 MG tablet Take 1 tablet (800 mg total) by mouth every 8 (eight) hours as needed. 02/13/15  Yes Golden Circle, FNP  metoprolol (LOPRESSOR) 50 MG tablet Take 25 mg by mouth 2 (two) times daily.  03/15/15  Yes Hendricks Limes, MD  albuterol (PROVENTIL HFA;VENTOLIN HFA) 108 (90 BASE) MCG/ACT inhaler Inhale 2 puffs into the lungs every 6 (six) hours as needed for  wheezing or shortness of breath. Patient not taking: Reported on 03/26/2015 11/20/14   Golden Circle, FNP  ALPRAZolam Duanne Moron) 0.25 MG tablet Take 1 tablet (0.25 mg total) by mouth 2 (two) times daily as needed for anxiety. Patient not taking: Reported on 03/26/2015 01/03/15   Golden Circle, FNP  aspirin EC 325 MG tablet Take 1 tablet (325 mg total) by mouth daily. Patient not taking: Reported on 03/26/2015 04/06/13   Varney Biles, MD  furosemide (LASIX) 40 MG tablet Take 1 tablet (40 mg total) by mouth daily. Patient not taking: Reported on 03/26/2015 03/06/15   Golden Circle, FNP  hydroxychloroquine (PLAQUENIL) 200 MG tablet Take 1 tablet (200 mg total) by mouth daily. Patient not taking: Reported on 03/26/2015 03/07/15   Golden Circle, FNP  Ipratropium-Albuterol (COMBIVENT) 20-100 MCG/ACT AERS respimat Inhale 1 puff into the lungs every 6 (six) hours as needed for wheezing. Patient not taking: Reported on 03/26/2015 11/28/14   Golden Circle, FNP   BP 185/90 mmHg  Pulse 76  Temp(Src) 97.8 F (36.6 C) (Oral)  Resp 26  SpO2 97% Physical Exam  Constitutional: She is oriented to person, place, and time. She appears well-developed and well-nourished. No distress.  Patient sitting upright comfortably in bed with no signs of distress  HENT:  Head: Normocephalic and atraumatic.  Mouth/Throat: Oropharynx is clear and moist. No oropharyngeal exudate.  Eyes: Conjunctivae and EOM are normal. Pupils are equal, round, and reactive to light. Right eye exhibits no discharge. Left eye exhibits no discharge.  Peripheral fields intact  Neck: Normal range of motion. Neck supple. No tracheal deviation present.  Negative neck stiffness Negative nuchal rigidity  Negative cervical lymphadenopathy  Negative meningeal signs   Cardiovascular: Normal rate, regular rhythm and normal heart sounds.  Exam reveals no friction rub.   No murmur heard. Pulmonary/Chest: Effort normal and breath sounds normal. No  respiratory distress. She has no wheezes. She has no rales.  Musculoskeletal: Normal range of motion.  Full ROM to upper and lower extremities without difficulty noted, negative ataxia noted.  Lymphadenopathy:    She has no cervical adenopathy.  Neurological: She is alert and oriented to person, place, and time. No cranial nerve deficit. She exhibits normal muscle tone. Coordination normal.  Cranial nerves III-XII grossly intact Strength 5+/5+ to upper and lower extremities bilaterally with resistance applied, equal distribution noted Equal grip strength Negative arm drift Negative facial droop Negative slurred speech Negative aphasia Patient is able to bring finger to nose bilaterally and finger to nose this provider's finger without difficulty Fine motor skills intact  Skin: Skin is warm and dry. No rash noted. She is not diaphoretic. No erythema.  Psychiatric: She has a normal mood and affect. Her behavior is normal. Thought content normal.  Nursing note and vitals reviewed.   ED Course  Procedures (including critical care time)  Results for orders placed or performed during the hospital  encounter of 03/26/15  CBC with Differential  Result Value Ref Range   WBC 5.3 4.0 - 10.5 K/uL   RBC 3.95 3.87 - 5.11 MIL/uL   Hemoglobin 11.6 (L) 12.0 - 15.0 g/dL   HCT 35.8 (L) 36.0 - 46.0 %   MCV 90.6 78.0 - 100.0 fL   MCH 29.4 26.0 - 34.0 pg   MCHC 32.4 30.0 - 36.0 g/dL   RDW 12.9 11.5 - 15.5 %   Platelets 172 150 - 400 K/uL   Neutrophils Relative % 56 43 - 77 %   Neutro Abs 3.0 1.7 - 7.7 K/uL   Lymphocytes Relative 33 12 - 46 %   Lymphs Abs 1.8 0.7 - 4.0 K/uL   Monocytes Relative 7 3 - 12 %   Monocytes Absolute 0.4 0.1 - 1.0 K/uL   Eosinophils Relative 3 0 - 5 %   Eosinophils Absolute 0.2 0.0 - 0.7 K/uL   Basophils Relative 1 0 - 1 %   Basophils Absolute 0.0 0.0 - 0.1 K/uL  Comprehensive metabolic panel  Result Value Ref Range   Sodium 138 135 - 145 mmol/L   Potassium 3.9 3.5 -  5.1 mmol/L   Chloride 102 101 - 111 mmol/L   CO2 27 22 - 32 mmol/L   Glucose, Bld 112 (H) 65 - 99 mg/dL   BUN 7 6 - 20 mg/dL   Creatinine, Ser 0.81 0.44 - 1.00 mg/dL   Calcium 8.9 8.9 - 10.3 mg/dL   Total Protein 6.5 6.5 - 8.1 g/dL   Albumin 3.7 3.5 - 5.0 g/dL   AST 20 15 - 41 U/L   ALT 13 (L) 14 - 54 U/L   Alkaline Phosphatase 75 38 - 126 U/L   Total Bilirubin 0.4 0.3 - 1.2 mg/dL   GFR calc non Af Amer >60 >60 mL/min   GFR calc Af Amer >60 >60 mL/min   Anion gap 9 5 - 15  Urinalysis, Routine w reflex microscopic  Result Value Ref Range   Color, Urine YELLOW YELLOW   APPearance CLEAR CLEAR   Specific Gravity, Urine 1.009 1.005 - 1.030   pH 7.5 5.0 - 8.0   Glucose, UA NEGATIVE NEGATIVE mg/dL   Hgb urine dipstick NEGATIVE NEGATIVE   Bilirubin Urine NEGATIVE NEGATIVE   Ketones, ur NEGATIVE NEGATIVE mg/dL   Protein, ur NEGATIVE NEGATIVE mg/dL   Urobilinogen, UA 0.2 0.0 - 1.0 mg/dL   Nitrite NEGATIVE NEGATIVE   Leukocytes, UA NEGATIVE NEGATIVE    Labs Review Labs Reviewed  CBC WITH DIFFERENTIAL/PLATELET - Abnormal; Notable for the following:    Hemoglobin 11.6 (*)    HCT 35.8 (*)    All other components within normal limits  COMPREHENSIVE METABOLIC PANEL - Abnormal; Notable for the following:    Glucose, Bld 112 (*)    ALT 13 (*)    All other components within normal limits  URINALYSIS, ROUTINE W REFLEX MICROSCOPIC    Imaging Review No results found.   EKG Interpretation   Date/Time:  Tuesday Mar 26 2015 06:01:27 EDT Ventricular Rate:  58 PR Interval:  182 QRS Duration: 101 QT Interval:  466 QTC Calculation: 458 R Axis:   9 Text Interpretation:  Sinus rhythm Probable left atrial enlargement RSR'  in V1 or V2, probably normal variant Confirmed by [facility]  MD, APRIL  ([id number]) on 03/26/2015 6:07:54 AM       9:06 AM Spoke with attending physician, Dr. Allie Bossier. Discussed case and physical examination in great detail. Agreed  with pain medications and CT.  Reported that Neuro does not need to be consulted. Highly unlikely to be SAH or meningitis - no LP needed at this time.   11:18 AM This provider re-assessed the patient - patient reported that her headache is now a 2 out of 10. Reported that she would like to try something to eat.   12:02 PM Nurse ambulated patient - reported that patient did well without complaints. Patient tolerated food and fluids PO without difficulty. Reported that patient is sleeping comfortably.  12:25 PM Dr. Allie Bossier to assess. Assessed patient - reported that patient can be discharged home.   MDM   Final diagnoses:  Nonintractable headache, unspecified chronicity pattern, unspecified headache type  Elevated blood pressure    Medications  metoCLOPramide (REGLAN) injection 10 mg (10 mg Intravenous Given 03/26/15 0910)  diphenhydrAMINE (BENADRYL) injection 25 mg (25 mg Intravenous Given 03/26/15 0909)  sodium chloride 0.9 % bolus 1,000 mL (0 mLs Intravenous Stopped 03/26/15 1026)    Filed Vitals:   03/26/15 1145 03/26/15 1200 03/26/15 1215 03/26/15 1230  BP: 150/84 165/80 157/79 155/94  Pulse: 55 76 68 67  Temp:      TempSrc:      Resp: 17 18 22 17   SpO2: 99% 99% 100% 99%    This provider reviewed patient's chart. Patient was seen and assessed in ED setting on 02/25/2015 regarding headache and elevated blood pressure. CT head unremarkable, MRI of the brain unremarkable. Patient was seen and assessed by neurology. Patient was discharged home. Noted normal sinus rhythm with a heart rate of 58 bpm, left atrial enlargement noted. CBC negative elevated leukocytosis. Hemoglobin 11.6, hematocrit 35.8. CMP unremarkable. Urinalysis negative for hemoglobin, nitrites, leukocytes-negative findings of infection. CT head within normal limits. Negative focal neurological deficits. Patient follows commands well. Equal grip strength bilaterally. Sensation intact. CT head unremarkable. Patient had MRI approximately one month ago with  negative findings. Doubt meningitis. Doubt subarachnoid hemorrhage. Doubt ICH. Suspicion to be complicated migraine. Patient given IV fluids and medications in ED setting with positive relief. Negative changes in mentation while in the ED, alert and oriented 3. Discussed case in great detail with attending physician and patient seen by attending, Dr. Allie Bossier - agreed to plan of discharge. Patient stable, afebrile. Patient not septic appearing. Patient in no sign of distress. Patient tolerated PO challenge - negative episodes of emesis while in the ED setting. Patient does have history of high blood pressure on medication - elevated today - negative signs of end organ damage - referred to PCP. Discharged patient. Discussed with patient to rest and stay hydrated. Discussed with patient to have blood pressure re-checked by PCP within 24-48 hours - highly recommended re-assessment by PCP within 24-48 hours. Referred to Neuro regarding headache. Discussed with patient to closely monitor symptoms and if symptoms are to worsen or change to report back to the ED - strict return instructions given.  Patient agreed to plan of care, understood, all questions answered.   Jamse Mead, PA-C 03/26/15 1254  Wandra Arthurs, MD 03/26/15 [phone]

## 2015-03-26 NOTE — ED Notes (Signed)
PA at the bedside.

## 2015-03-26 NOTE — ED Notes (Signed)
Marissa,PA at the bedside.  

## 2015-03-26 NOTE — Discharge Instructions (Signed)
Please call your doctor for a followup appointment within 24-48 hours. When you talk to your doctor please let them know that you were seen in the emergency department and have them acquire all of your records so that they can discuss the findings with you and formulate a treatment plan to fully care for your new and ongoing problems. Please call and set-up an appointment with your primary care provider - please be re-assessed within 24-48 hours for blood pressure re-check Please follow up with Neurology regarding headaches Please rest and stay hydrated Please avoid strenuous activity  Please continue to monitor symptoms closely and if symptoms are to worsen or change (fever greater than 101, chills, sweating, nausea, vomiting, chest pain, shortness of breathe, difficulty breathing, weakness, numbness, tingling, worsening or changes to pain pattern, fall, head injury, visual changes, neck pain, neck stiffness, weakness to one side of the body, loss of sensation, loss of vision, speech issues) please report back to the Emergency Department immediately.   Migraine Headache A migraine headache is an intense, throbbing pain on one or both sides of your head. A migraine can last for 30 minutes to several hours. CAUSES  The exact cause of a migraine headache is not always known. However, a migraine may be caused when nerves in the brain become irritated and release chemicals that cause inflammation. This causes pain. Certain things may also trigger migraines, such as:  Alcohol.  Smoking.  Stress.  Menstruation.  Aged cheeses.  Foods or drinks that contain nitrates, glutamate, aspartame, or tyramine.  Lack of sleep.  Chocolate.  Caffeine.  Hunger.  Physical exertion.  Fatigue.  Medicines used to treat chest pain (nitroglycerine), birth control pills, estrogen, and some blood pressure medicines. SIGNS AND SYMPTOMS  Pain on one or both sides of your head.  Pulsating or throbbing  pain.  Severe pain that prevents daily activities.  Pain that is aggravated by any physical activity.  Nausea, vomiting, or both.  Dizziness.  Pain with exposure to bright lights, loud noises, or activity.  General sensitivity to bright lights, loud noises, or smells. Before you get a migraine, you may get warning signs that a migraine is coming (aura). An aura may include:  Seeing flashing lights.  Seeing bright spots, halos, or zigzag lines.  Having tunnel vision or blurred vision.  Having feelings of numbness or tingling.  Having trouble talking.  Having muscle weakness. DIAGNOSIS  A migraine headache is often diagnosed based on:  Symptoms.  Physical exam.  A CT scan or MRI of your head. These imaging tests cannot diagnose migraines, but they can help rule out other causes of headaches. TREATMENT Medicines may be given for pain and nausea. Medicines can also be given to help prevent recurrent migraines.  HOME CARE INSTRUCTIONS  Only take over-the-counter or prescription medicines for pain or discomfort as directed by your health care provider. The use of long-term narcotics is not recommended.  Lie down in a dark, quiet room when you have a migraine.  Keep a journal to find out what may trigger your migraine headaches. For example, write down:  What you eat and drink.  How much sleep you get.  Any change to your diet or medicines.  Limit alcohol consumption.  Quit smoking if you smoke.  Get 7-9 hours of sleep, or as recommended by your health care provider.  Limit stress.  Keep lights dim if bright lights bother you and make your migraines worse. SEEK IMMEDIATE MEDICAL CARE IF:  Your migraine becomes severe.  You have a fever.  You have a stiff neck.  You have vision loss.  You have muscular weakness or loss of muscle control.  You start losing your balance or have trouble walking.  You feel faint or pass out.  You have severe symptoms  that are different from your first symptoms. MAKE SURE YOU:   Understand these instructions.  Will watch your condition.  Will get help right away if you are not doing well or get worse. Document Released: 10/19/2005 Document Revised: 03/05/2014 Document Reviewed: 06/26/2013 [facility] Patient Information 2015 [place], [place]. This information is not intended to replace advice given to you by your health care provider. Make sure you discuss any questions you have with your health care provider.

## 2015-03-26 NOTE — ED Notes (Signed)
PA made aware that patient is sleeping and patient ambulated well.

## 2015-03-26 NOTE — ED Notes (Addendum)
Pt. arrived with EMS from home reports headache with photophobia , mild blurred vision and lightheaded onset last night after taking 1st dose of Gabapentin . Respirations unlabored / alert and oriented .

## 2015-04-03 ENCOUNTER — Ambulatory Visit
Admission: RE | Admit: 2015-04-03 | Discharge: 2015-04-03 | Disposition: A | Discharge status: Home / Self Care | Payer: BLUE CROSS/BLUE SHIELD | Source: Ambulatory Visit | Attending: Internal Medicine | Admitting: Internal Medicine

## 2015-04-03 ENCOUNTER — Other Ambulatory Visit: Payer: Self-pay | Admitting: Internal Medicine

## 2015-04-03 DIAGNOSIS — M542 Cervicalgia: Secondary | ICD-10-CM

## 2015-04-08 ENCOUNTER — Emergency Department (HOSPITAL_COMMUNITY): Payer: BLUE CROSS/BLUE SHIELD

## 2015-04-08 ENCOUNTER — Emergency Department (HOSPITAL_COMMUNITY)
Admission: EM | Admit: 2015-04-08 | Discharge: 2015-04-09 | Disposition: A | Discharge status: Home / Self Care | Payer: BLUE CROSS/BLUE SHIELD | Attending: Emergency Medicine | Admitting: Emergency Medicine

## 2015-04-08 ENCOUNTER — Encounter (HOSPITAL_COMMUNITY): Payer: Self-pay | Admitting: Emergency Medicine

## 2015-04-08 DIAGNOSIS — Z79899 Other long term (current) drug therapy: Secondary | ICD-10-CM | POA: Diagnosis not present

## 2015-04-08 DIAGNOSIS — R5383 Other fatigue: Secondary | ICD-10-CM | POA: Insufficient documentation

## 2015-04-08 DIAGNOSIS — J45909 Unspecified asthma, uncomplicated: Secondary | ICD-10-CM | POA: Diagnosis not present

## 2015-04-08 DIAGNOSIS — R51 Headache: Secondary | ICD-10-CM | POA: Insufficient documentation

## 2015-04-08 DIAGNOSIS — I10 Essential (primary) hypertension: Secondary | ICD-10-CM | POA: Diagnosis not present

## 2015-04-08 DIAGNOSIS — F411 Generalized anxiety disorder: Secondary | ICD-10-CM | POA: Diagnosis not present

## 2015-04-08 DIAGNOSIS — Z8739 Personal history of other diseases of the musculoskeletal system and connective tissue: Secondary | ICD-10-CM | POA: Insufficient documentation

## 2015-04-08 DIAGNOSIS — R519 Headache, unspecified: Secondary | ICD-10-CM

## 2015-04-08 LAB — BASIC METABOLIC PANEL
Anion gap: 13 (ref 5–15)
BUN: 15 mg/dL (ref 6–20)
CALCIUM: 9.7 mg/dL (ref 8.9–10.3)
CO2: 25 mmol/L (ref 22–32)
Chloride: 104 mmol/L (ref 101–111)
Creatinine, Ser: 0.73 mg/dL (ref 0.44–1.00)
GFR calc non Af Amer: >60 mL/min (ref >60)
Glucose, Bld: 100 mg/dL — ABNORMAL HIGH (ref 65–99)
POTASSIUM: 3.8 mmol/L (ref 3.5–5.1)
Sodium: 142 mmol/L (ref 135–145)

## 2015-04-08 LAB — CBC
HCT: 36.9 % (ref 36.0–46.0)
HEMOGLOBIN: 12.1 g/dL (ref 12.0–15.0)
MCH: 30 pg (ref 26.0–34.0)
MCHC: 32.8 g/dL (ref 30.0–36.0)
MCV: 91.3 fL (ref 78.0–100.0)
PLATELETS: 189 10*3/uL (ref 150–400)
RBC: 4.04 MIL/uL (ref 3.87–5.11)
RDW: 12.5 % (ref 11.5–15.5)
WBC: 7 10*3/uL (ref 4.0–10.5)

## 2015-04-08 LAB — I-STAT TROPONIN, ED: Troponin i, poc: 0 ng/mL (ref 0.00–0.08)

## 2015-04-08 MED ORDER — SODIUM CHLORIDE 0.9 % IV BOLUS (SEPSIS)
1000.0000 mL | Freq: Once | INTRAVENOUS | Status: AC
  Administered 2015-04-08: 1000 mL via INTRAVENOUS

## 2015-04-08 MED ORDER — METOCLOPRAMIDE HCL 5 MG/ML IJ SOLN
10.0000 mg | Freq: Once | INTRAMUSCULAR | Status: AC
  Administered 2015-04-08: 10 mg via INTRAVENOUS
  Filled 2015-04-08: qty 2

## 2015-04-08 MED ORDER — KETOROLAC TROMETHAMINE 30 MG/ML IJ SOLN
30.0000 mg | Freq: Once | INTRAMUSCULAR | Status: AC
  Administered 2015-04-08: 30 mg via INTRAVENOUS
  Filled 2015-04-08: qty 1

## 2015-04-08 MED ORDER — DIPHENHYDRAMINE HCL 50 MG/ML IJ SOLN
12.5000 mg | Freq: Once | INTRAMUSCULAR | Status: AC
  Administered 2015-04-08: 12.5 mg via INTRAVENOUS
  Filled 2015-04-08: qty 1

## 2015-04-08 NOTE — ED Provider Notes (Signed)
CSN: [id number]     Arrival date & time 04/08/15  2138 History   First MD Initiated Contact with Patient 04/08/15 2221     Chief Complaint  Patient presents with  . Hypertension     (Consider location/radiation/quality/duration/timing/severity/associated sxs/prior Treatment) Patient is a 54 y.o. female presenting with hypertension. The history is provided by the patient and medical records. No language interpreter was used.  Hypertension Associated symptoms include fatigue and headaches. Pertinent negatives include no abdominal pain, chest pain, coughing, diaphoresis, fever, nausea, rash or vomiting.     [name] is a 54 y.o. female  with a hx of HTN, chronic headaches, peripheral edema, back pain, asthma, lupus presents to the Emergency Department complaining of gradual, persistent, progressively worsening headache onset this morning.  Pt reports they have been treated her HTN for months but in spite of medication changes her BP has been high.  Pt reports she is currently taking Valsartan 160mg  BID for her HTN.  She reports the headache the has now is the same as her chronic headaches and it is located along the bridge of her nose and along the left posterior of the neck. Pt reports this headache is less intense than the one she was seen for several weeks ago.  Pt is not taking any blood thinners.  No fevers at home.  Pt denies chest pain at this time.  She reports that earlier after she became anxious about the HTN her left arm felt heavy this this has gone away.  Associated symptoms include lightheadedness, sometimes feeling that she is confused (but is totally coherent and A&Ox4). Nothing makes her symptoms better or worse. She specifically denies it worsening headache, lightheadedness, left arm heaviness with exertion.    Past Medical History  Diagnosis Date  . Hypertension   . Edema   . Pain in limb   . Back pain   . Asthma   . MVP (mitral valve prolapse) 12/22/2011  . S/P  dilatation of esophageal stricture 12/22/2011  . Positive ANA (antinuclear antibody) 01/29/2015  . Lupus    Past Surgical History  Procedure Laterality Date  . Abdominal hysterectomy    . Cardiac catheterization    . Tonsillectomy    . Spine surgery     Family History  Problem Relation Age of Onset  . Diabetes Mother   . Hypertension Mother   . Heart disease Mother   . Diabetes Father   . Hypertension Father   . COPD Father   . Heart attack Father 17  . Stroke Other   . Coronary artery disease Other    History  Substance Use Topics  . Smoking status: Never Smoker   . Smokeless tobacco: Never Used  . Alcohol Use: No   OB History    No data available     Review of Systems  Constitutional: Positive for fatigue. Negative for fever, diaphoresis, appetite change and unexpected weight change.  HENT: Negative for mouth sores.   Eyes: Negative for visual disturbance.  Respiratory: Negative for cough, chest tightness, shortness of breath and wheezing.   Cardiovascular: Negative for chest pain.  Gastrointestinal: Negative for nausea, vomiting, abdominal pain, diarrhea and constipation.  Endocrine: Negative for polydipsia, polyphagia and polyuria.  Genitourinary: Negative for dysuria, urgency, frequency and hematuria.  Musculoskeletal: Negative for back pain and neck stiffness.  Skin: Negative for rash.  Allergic/Immunologic: Negative for immunocompromised state.  Neurological: Positive for light-headedness and headaches. Negative for syncope.  Hematological: Does not bruise/bleed easily.  Psychiatric/Behavioral: Negative for sleep disturbance. The patient is not nervous/anxious.       Allergies  Morphine and related; Pantoprazole sodium; and Prednisone  Home Medications   Prior to Admission medications   Medication Sig Start Date End Date Taking? Authorizing Provider  Cholecalciferol (VITAMIN D PO) Take by mouth.   Yes Historical Provider, MD  furosemide (LASIX) 40 MG  tablet Take 1 tablet (40 mg total) by mouth daily. 03/06/15  Yes Golden Circle, FNP  hydroxychloroquine (PLAQUENIL) 200 MG tablet Take 1 tablet (200 mg total) by mouth daily. Patient taking differently: Take 400 mg by mouth daily.  03/07/15  Yes Golden Circle, FNP  Omega-3 Fatty Acids (FISH OIL PO) Take by mouth.   Yes Historical Provider, MD  valsartan (DIOVAN) 160 MG tablet Take 160 mg by mouth daily. 03/29/15  Yes Historical Provider, MD  albuterol (PROVENTIL HFA;VENTOLIN HFA) 108 (90 BASE) MCG/ACT inhaler Inhale 2 puffs into the lungs every 6 (six) hours as needed for wheezing or shortness of breath. Patient not taking: Reported on 03/26/2015 11/20/14   Golden Circle, FNP  ALPRAZolam Duanne Moron) 0.25 MG tablet Take 1 tablet (0.25 mg total) by mouth 2 (two) times daily as needed for anxiety. Patient not taking: Reported on 03/26/2015 01/03/15   Golden Circle, FNP  aspirin EC 325 MG tablet Take 1 tablet (325 mg total) by mouth daily. Patient not taking: Reported on 03/26/2015 04/06/13   Varney Biles, MD  gabapentin (NEURONTIN) 100 MG capsule One pill every eight hours as needed; dose may be increased by one pill each dose after 72 hours if only partially effective Patient taking differently: Take 100 mg by mouth every 8 (eight) hours as needed (for pain).  03/15/15   Hendricks Limes, MD  ibuprofen (ADVIL,MOTRIN) 800 MG tablet Take 1 tablet (800 mg total) by mouth every 8 (eight) hours as needed. Patient not taking: Reported on 04/08/2015 02/13/15   Golden Circle, FNP  Ipratropium-Albuterol (COMBIVENT) 20-100 MCG/ACT AERS respimat Inhale 1 puff into the lungs every 6 (six) hours as needed for wheezing. Patient not taking: Reported on 03/26/2015 11/28/14   Golden Circle, FNP   BP 203/124 mmHg  Pulse 59  Temp(Src) 98.4 F (36.9 C) (Oral)  Resp 18  SpO2 98% Physical Exam  Constitutional: She is oriented to person, place, and time. She appears well-developed and well-nourished. No distress.   Awake, alert, nontoxic appearance  HENT:  Head: Normocephalic and atraumatic.  Mouth/Throat: Oropharynx is clear and moist. No oropharyngeal exudate.  Eyes: Conjunctivae and EOM are normal. Pupils are equal, round, and reactive to light. No scleral icterus.  No horizontal, vertical or rotational nystagmus  Neck: Normal range of motion. Neck supple.  Full active and passive ROM without pain No midline or paraspinal tenderness No nuchal rigidity or meningeal signs  Cardiovascular: Normal rate, regular rhythm, normal heart sounds and intact distal pulses.   Pulmonary/Chest: Effort normal and breath sounds normal. No respiratory distress. She has no wheezes. She has no rales.  Equal chest expansion  Abdominal: Soft. Bowel sounds are normal. She exhibits no distension and no mass. There is no tenderness. There is no rebound and no guarding.  Musculoskeletal: Normal range of motion. She exhibits no edema.  Lymphadenopathy:    She has no cervical adenopathy.  Neurological: She is alert and oriented to person, place, and time. She has normal reflexes. No cranial nerve deficit. She exhibits normal muscle tone. Coordination normal.  Mental Status:  Alert,  oriented, thought content appropriate. Speech fluent without evidence of aphasia. Able to follow 2 step commands without difficulty.  Cranial Nerves:  II:  Peripheral visual fields grossly normal, pupils equal, round, reactive to light III,IV, VI: ptosis not present, extra-ocular motions intact bilaterally  V,VII: smile symmetric, facial light touch sensation equal VIII: hearing grossly normal bilaterally  IX,X: gag reflex present  XI: bilateral shoulder shrug equal and strong XII: midline tongue extension  Motor:  5/5 in upper and lower extremities bilaterally including strong and equal grip strength and dorsiflexion/plantar flexion Sensory: Pinprick and light touch normal in all extremities.  Deep Tendon Reflexes: 2+ and symmetric   Cerebellar: normal finger-to-nose with bilateral upper extremities Gait: normal gait and balance CV: distal pulses palpable throughout   Skin: Skin is warm and dry. No rash noted. She is not diaphoretic.  Psychiatric: She has a normal mood and affect. Her behavior is normal. Judgment and thought content normal.  Nursing note and vitals reviewed.   ED Course  Procedures (including critical care time) Labs Review Labs Reviewed  BASIC METABOLIC PANEL - Abnormal; Notable for the following:    Glucose, Bld 100 (*)    All other components within normal limits  CBC  I-STAT TROPOININ, ED    Imaging Review Ct Head Wo Contrast  04/08/2015   CLINICAL DATA:  Initial evaluation for acute headache, hypertension.  EXAM: CT HEAD WITHOUT CONTRAST  TECHNIQUE: Contiguous axial images were obtained from the base of the skull through the vertex without intravenous contrast.  COMPARISON:  Prior study from 03/26/2015  FINDINGS: There is no acute intracranial hemorrhage or infarct. No mass lesion or midline shift. Gray-white matter differentiation is well maintained. Ventricles are normal in size without evidence of hydrocephalus. CSF containing spaces are within normal limits. No extra-axial fluid collection.  The calvarium is intact.  Orbital soft tissues are within normal limits.  The paranasal sinuses and mastoid air cells are well pneumatized and free of fluid.  Scalp soft tissues are unremarkable.  IMPRESSION: Normal head CT with no acute intracranial process identified.   Electronically Signed   By: Jeannine Boga M.D.   On: 04/08/2015 23:19     EKG Interpretation   Date/Time:  Monday April 08 2015 21:56:03 EDT Ventricular Rate:  66 PR Interval:  153 QRS Duration: 95 QT Interval:  426 QTC Calculation: 446 R Axis:   -10 Text Interpretation:  Sinus rhythm RSR' in V1 or V2, probably normal  variant since last tracing no significant change Confirmed by [facility]  MD,  ELLIOTT ([id number]) on 04/09/2015  12:26:07 AM      MDM   Final diagnoses:  Essential hypertension  Generalized anxiety disorder  Nonintractable headache, unspecified chronicity pattern, unspecified headache type   [name] presents with headache and HTN. Pt with history of both and headache is unchanged from previous.  Tonight patient had left arm heaviness after she became anxious but this has resolved.  She has a normal neurologic exam.  She denies chest pain or shortness of breath, near syncope.  Heart Score 2.      12:21 AM Patient with complete resolution of headache.  Her blood pressure remains elevated however she is currently asymptomatic. Repeat neurologic exam shows no focal abnormalities. She ambulated here in the emergency department without difficulty. She no longer has any symptoms and requests d/c home.  She is tolerating by mouth without difficulty.  12:54 AM Pt refusing delta troponin. She is well appearing, low risk and asymptomatic  at this time.  Onset of her left arm heaviness was > 6 hours prior to her arrival in the ED. Discussed with patient the need for close follow-up and management by their primary care physician with planned recheck in 24-48 hours. Also discussed reasons to return to the emergency department.  She continues to be very anxious about everything and I believe this is a contributing factor to her headache and HTN.     BP 203/124 mmHg  Pulse 59  Temp(Src) 98.4 F (36.9 C) (Oral)  Resp 18  SpO2 98%   The patient was discussed with Dr. Eulis Foster who agrees with the treatment plan.    Jarrett Soho Savilla Turbyfill, PA-C 04/09/15 2542  Daleen Bo, MD 04/15/15 1945

## 2015-04-08 NOTE — ED Notes (Signed)
Pt states she has elevated blood pressure  Pt is c/o headache, light headed, left arm feels heavy, confusion, feels lethargic, and has pain in the left side of her neck

## 2015-04-09 NOTE — Discharge Instructions (Signed)
1. Medications: usual home medications 2. Treatment: rest, drink plenty of fluids,  3. Follow Up: Please followup with your primary doctor in 24-48 hours for discussion of your diagnoses and further evaluation after today's visit and blood pressure recheck; please return to the emergency department for worsening symptoms, return of headache or other concerns

## 2015-04-09 NOTE — ED Notes (Signed)
Pt does not want to have I-stat Tropinion drawn. Unable to pull back blood from existing IV. Notified provider.

## 2015-04-10 ENCOUNTER — Emergency Department (INDEPENDENT_AMBULATORY_CARE_PROVIDER_SITE_OTHER)
Admission: EM | Admit: 2015-04-10 | Discharge: 2015-04-10 | Disposition: A | Discharge status: Home / Self Care | Payer: BLUE CROSS/BLUE SHIELD | Source: Home / Self Care | Attending: Family Medicine | Admitting: Family Medicine

## 2015-04-10 ENCOUNTER — Encounter (HOSPITAL_COMMUNITY): Payer: Self-pay | Admitting: Emergency Medicine

## 2015-04-10 ENCOUNTER — Ambulatory Visit: Payer: BLUE CROSS/BLUE SHIELD

## 2015-04-10 ENCOUNTER — Ambulatory Visit: Payer: BLUE CROSS/BLUE SHIELD | Admitting: Family Medicine

## 2015-04-10 DIAGNOSIS — I10 Essential (primary) hypertension: Secondary | ICD-10-CM

## 2015-04-10 MED ORDER — AMLODIPINE BESYLATE 10 MG PO TABS: 10.0000 mg | ORAL_TABLET | Freq: Every day | ORAL | Status: DC

## 2015-04-10 NOTE — ED Notes (Signed)
Htn, concerns with blood pressure for 8 months

## 2015-04-10 NOTE — Discharge Instructions (Signed)
Take medicine as prescribed with your valsartan and DO NOT check your blood pressure until seen by your doctor in 7-10 days.

## 2015-04-10 NOTE — ED Provider Notes (Signed)
CSN: [id number]     Arrival date & time 04/10/15  1937 History   First MD Initiated Contact with Patient 04/10/15 2018     Chief Complaint  Patient presents with  . Hypertension   (Consider location/radiation/quality/duration/timing/severity/associated sxs/prior Treatment) Patient is a 54 y.o. female presenting with hypertension. The history is provided by the patient.  Hypertension This is a chronic problem. The current episode started more than 1 week ago (pt stressed and extremely anxious about bp readings and meds and no med treatment given, was seen yest in ER with neg w/u. pt still overly concerned.). The problem has not changed since onset.Associated symptoms include headaches. Pertinent negatives include no chest pain and no abdominal pain.    Past Medical History  Diagnosis Date  . Hypertension   . Edema   . Pain in limb   . Back pain   . Asthma   . MVP (mitral valve prolapse) 12/22/2011  . S/P dilatation of esophageal stricture 12/22/2011  . Positive ANA (antinuclear antibody) 01/29/2015  . Lupus    Past Surgical History  Procedure Laterality Date  . Abdominal hysterectomy    . Cardiac catheterization    . Tonsillectomy    . Spine surgery     Family History  Problem Relation Age of Onset  . Diabetes Mother   . Hypertension Mother   . Heart disease Mother   . Diabetes Father   . Hypertension Father   . COPD Father   . Heart attack Father 20  . Stroke Other   . Coronary artery disease Other    History  Substance Use Topics  . Smoking status: Never Smoker   . Smokeless tobacco: Never Used  . Alcohol Use: No   OB History    No data available     Review of Systems  Constitutional: Negative.   Respiratory: Negative.   Cardiovascular: Negative.  Negative for chest pain, palpitations and leg swelling.  Gastrointestinal: Negative for abdominal pain.  Neurological: Positive for headaches.    Allergies  Morphine and related; Pantoprazole sodium; and  Prednisone  Home Medications   Prior to Admission medications   Medication Sig Start Date End Date Taking? Authorizing Provider  albuterol (PROVENTIL HFA;VENTOLIN HFA) 108 (90 BASE) MCG/ACT inhaler Inhale 2 puffs into the lungs every 6 (six) hours as needed for wheezing or shortness of breath. Patient not taking: Reported on 03/26/2015 11/20/14   Golden Circle, FNP  ALPRAZolam Duanne Moron) 0.25 MG tablet Take 1 tablet (0.25 mg total) by mouth 2 (two) times daily as needed for anxiety. Patient not taking: Reported on 03/26/2015 01/03/15   Golden Circle, FNP  amLODipine (NORVASC) 10 MG tablet Take 1 tablet (10 mg total) by mouth daily. 04/10/15   Billy Fischer, MD  aspirin EC 325 MG tablet Take 1 tablet (325 mg total) by mouth daily. Patient not taking: Reported on 03/26/2015 04/06/13   Varney Biles, MD  Cholecalciferol (VITAMIN D PO) Take by mouth.    Historical Provider, MD  furosemide (LASIX) 40 MG tablet Take 1 tablet (40 mg total) by mouth daily. 03/06/15   Golden Circle, FNP  gabapentin (NEURONTIN) 100 MG capsule One pill every eight hours as needed; dose may be increased by one pill each dose after 72 hours if only partially effective Patient taking differently: Take 100 mg by mouth every 8 (eight) hours as needed (for pain).  03/15/15   Hendricks Limes, MD  hydroxychloroquine (PLAQUENIL) 200 MG tablet Take 1 tablet (  200 mg total) by mouth daily. Patient taking differently: Take 400 mg by mouth daily.  03/07/15   Golden Circle, FNP  ibuprofen (ADVIL,MOTRIN) 800 MG tablet Take 1 tablet (800 mg total) by mouth every 8 (eight) hours as needed. Patient not taking: Reported on 04/08/2015 02/13/15   Golden Circle, FNP  Ipratropium-Albuterol (COMBIVENT) 20-100 MCG/ACT AERS respimat Inhale 1 puff into the lungs every 6 (six) hours as needed for wheezing. Patient not taking: Reported on 03/26/2015 11/28/14   Golden Circle, FNP  Omega-3 Fatty Acids (FISH OIL PO) Take by mouth.    Historical Provider,  MD  valsartan (DIOVAN) 160 MG tablet Take 160 mg by mouth daily. 03/29/15   Historical Provider, MD   BP 181/106 mmHg  Pulse 76  Temp(Src) 98.1 F (36.7 C) (Oral)  Resp 16  SpO2 98% Physical Exam  Constitutional: She appears well-developed and well-nourished. She appears distressed.  HENT:  Head: Normocephalic.  Neck: Normal range of motion. Neck supple.  Cardiovascular: Normal rate, normal heart sounds and intact distal pulses.   Pulmonary/Chest: Effort normal and breath sounds normal.  Musculoskeletal: She exhibits no edema.  Lymphadenopathy:    She has no cervical adenopathy.  Skin: Skin is warm and dry.  Nursing note and vitals reviewed.   ED Course  Procedures (including critical care time) Labs Review Labs Reviewed - No data to display  Imaging Review Ct Head Wo Contrast  04/08/2015   CLINICAL DATA:  Initial evaluation for acute headache, hypertension.  EXAM: CT HEAD WITHOUT CONTRAST  TECHNIQUE: Contiguous axial images were obtained from the base of the skull through the vertex without intravenous contrast.  COMPARISON:  Prior study from 03/26/2015  FINDINGS: There is no acute intracranial hemorrhage or infarct. No mass lesion or midline shift. Gray-white matter differentiation is well maintained. Ventricles are normal in size without evidence of hydrocephalus. CSF containing spaces are within normal limits. No extra-axial fluid collection.  The calvarium is intact.  Orbital soft tissues are within normal limits.  The paranasal sinuses and mastoid air cells are well pneumatized and free of fluid.  Scalp soft tissues are unremarkable.  IMPRESSION: Normal head CT with no acute intracranial process identified.   Electronically Signed   By: Jeannine Boga M.D.   On: 04/08/2015 23:19     MDM   1. Essential hypertension       Billy Fischer, MD 04/10/15 2050

## 2015-04-17 ENCOUNTER — Telehealth: Payer: Self-pay | Admitting: Geriatric Medicine

## 2015-04-17 NOTE — Telephone Encounter (Signed)
Left message asking patient to call back to let me know if she has had a mammogram recently.  

## 2015-06-07 ENCOUNTER — Encounter (HOSPITAL_BASED_OUTPATIENT_CLINIC_OR_DEPARTMENT_OTHER): Payer: Self-pay | Admitting: *Deleted

## 2015-06-07 ENCOUNTER — Emergency Department (HOSPITAL_BASED_OUTPATIENT_CLINIC_OR_DEPARTMENT_OTHER)
Admission: EM | Admit: 2015-06-07 | Discharge: 2015-06-08 | Discharge status: Against Medical Advice | Payer: BLUE CROSS/BLUE SHIELD | Attending: Emergency Medicine | Admitting: Emergency Medicine

## 2015-06-07 DIAGNOSIS — I10 Essential (primary) hypertension: Secondary | ICD-10-CM | POA: Insufficient documentation

## 2015-06-07 DIAGNOSIS — J45909 Unspecified asthma, uncomplicated: Secondary | ICD-10-CM | POA: Insufficient documentation

## 2015-06-07 DIAGNOSIS — M79671 Pain in right foot: Secondary | ICD-10-CM | POA: Insufficient documentation

## 2015-06-07 NOTE — ED Notes (Signed)
C/o pain in right foot x 30 min. C/o hematoma to top of foot. No injury. Slight swelling noted to top of foot on right.

## 2015-06-30 ENCOUNTER — Encounter (HOSPITAL_COMMUNITY): Payer: Self-pay | Admitting: Emergency Medicine

## 2015-06-30 ENCOUNTER — Emergency Department (HOSPITAL_COMMUNITY)
Admission: EM | Admit: 2015-06-30 | Discharge: 2015-06-30 | Disposition: A | Discharge status: Home / Self Care | Payer: BLUE CROSS/BLUE SHIELD | Attending: Emergency Medicine | Admitting: Emergency Medicine

## 2015-06-30 ENCOUNTER — Emergency Department (HOSPITAL_COMMUNITY): Payer: BLUE CROSS/BLUE SHIELD

## 2015-06-30 DIAGNOSIS — Z8739 Personal history of other diseases of the musculoskeletal system and connective tissue: Secondary | ICD-10-CM | POA: Insufficient documentation

## 2015-06-30 DIAGNOSIS — Y92512 Supermarket, store or market as the place of occurrence of the external cause: Secondary | ICD-10-CM | POA: Diagnosis not present

## 2015-06-30 DIAGNOSIS — Z79899 Other long term (current) drug therapy: Secondary | ICD-10-CM | POA: Diagnosis not present

## 2015-06-30 DIAGNOSIS — J45909 Unspecified asthma, uncomplicated: Secondary | ICD-10-CM | POA: Diagnosis not present

## 2015-06-30 DIAGNOSIS — S99911A Unspecified injury of right ankle, initial encounter: Secondary | ICD-10-CM | POA: Insufficient documentation

## 2015-06-30 DIAGNOSIS — S8991XA Unspecified injury of right lower leg, initial encounter: Secondary | ICD-10-CM | POA: Diagnosis not present

## 2015-06-30 DIAGNOSIS — Y9301 Activity, walking, marching and hiking: Secondary | ICD-10-CM | POA: Diagnosis not present

## 2015-06-30 DIAGNOSIS — S4991XA Unspecified injury of right shoulder and upper arm, initial encounter: Secondary | ICD-10-CM | POA: Insufficient documentation

## 2015-06-30 DIAGNOSIS — S99921A Unspecified injury of right foot, initial encounter: Secondary | ICD-10-CM | POA: Diagnosis present

## 2015-06-30 DIAGNOSIS — W1839XA Other fall on same level, initial encounter: Secondary | ICD-10-CM | POA: Insufficient documentation

## 2015-06-30 DIAGNOSIS — S93601A Unspecified sprain of right foot, initial encounter: Secondary | ICD-10-CM

## 2015-06-30 DIAGNOSIS — Z9889 Other specified postprocedural states: Secondary | ICD-10-CM | POA: Diagnosis not present

## 2015-06-30 DIAGNOSIS — I10 Essential (primary) hypertension: Secondary | ICD-10-CM | POA: Insufficient documentation

## 2015-06-30 DIAGNOSIS — T148XXA Other injury of unspecified body region, initial encounter: Secondary | ICD-10-CM

## 2015-06-30 DIAGNOSIS — S199XXA Unspecified injury of neck, initial encounter: Secondary | ICD-10-CM | POA: Diagnosis not present

## 2015-06-30 DIAGNOSIS — Y998 Other external cause status: Secondary | ICD-10-CM | POA: Insufficient documentation

## 2015-06-30 MED ORDER — NAPROXEN 500 MG PO TABS: 500.0000 mg | ORAL_TABLET | Freq: Two times a day (BID) | ORAL | Status: DC

## 2015-06-30 NOTE — Discharge Instructions (Signed)
Foot Sprain The muscles and cord like structures which attach muscle to bone (tendons) that surround the feet are made up of units. A foot sprain can occur at the weakest spot in any of these units. This condition is most often caused by injury to or overuse of the foot, as from playing contact sports, or aggravating a previous injury, or from poor conditioning, or obesity. SYMPTOMS  Pain with movement of the foot.  Tenderness and swelling at the injury site.  Loss of strength is present in moderate or severe sprains. THE THREE GRADES OR SEVERITY OF FOOT SPRAIN ARE:  Mild (Grade I): Slightly pulled muscle without tearing of muscle or tendon fibers or loss of strength.  Moderate (Grade II): Tearing of fibers in a muscle, tendon, or at the attachment to bone, with small decrease in strength.  Severe (Grade III): Rupture of the muscle-tendon-bone attachment, with separation of fibers. Severe sprain requires surgical repair. Often repeating (chronic) sprains are caused by overuse. Sudden (acute) sprains are caused by direct injury or over-use. DIAGNOSIS  Diagnosis of this condition is usually by your own observation. If problems continue, a caregiver may be required for further evaluation and treatment. X-rays may be required to make sure there are not breaks in the bones (fractures) present. Continued problems may require physical therapy for treatment. PREVENTION  Use strength and conditioning exercises appropriate for your sport.  Warm up properly prior to working out.  Use athletic shoes that are made for the sport you are participating in.  Allow adequate time for healing. Early return to activities makes repeat injury more likely, and can lead to an unstable arthritic foot that can result in prolonged disability. Mild sprains generally heal in 3 to 10 days, with moderate and severe sprains taking 2 to 10 weeks. Your caregiver can help you determine the proper time required for  healing. HOME CARE INSTRUCTIONS   Apply ice to the injury for 15-20 minutes, 03-04 times per day. Put the ice in a plastic bag and place a towel between the bag of ice and your skin.  An elastic wrap (like an Ace bandage) may be used to keep swelling down.  Keep foot above the level of the heart, or at least raised on a footstool, when swelling and pain are present.  Try to avoid use other than gentle range of motion while the foot is painful. Do not resume use until instructed by your caregiver. Then begin use gradually, not increasing use to the point of pain. If pain does develop, decrease use and continue the above measures, gradually increasing activities that do not cause discomfort, until you gradually achieve normal use.  Use crutches if and as instructed, and for the length of time instructed.  Keep injured foot and ankle wrapped between treatments.  Massage foot and ankle for comfort and to keep swelling down. Massage from the toes up towards the knee.  Only take over-the-counter or prescription medicines for pain, discomfort, or fever as directed by your caregiver. SEEK IMMEDIATE MEDICAL CARE IF:   Your pain and swelling increase, or pain is not controlled with medications.  You have loss of feeling in your foot or your foot turns cold or blue.  You develop new, unexplained symptoms, or an increase of the symptoms that brought you to your caregiver. MAKE SURE YOU:   Understand these instructions.  Will watch your condition.  Will get help right away if you are not doing well or get worse. Document Released:   04/10/2002 Document Revised: 01/11/2012 Document Reviewed: 06/07/2008 Exit[employer] Patient Information 2015 [employer]. This information is not intended to replace advice given to you by your health care provider. Make sure you discuss any questions you have with your health care provider.  

## 2015-06-30 NOTE — ED Notes (Signed)
Pt given ortho shoe and crutches. Pt instructed on how to use crutches. Pt demonstrated use of crutches and verbalized understanding.

## 2015-06-30 NOTE — ED Provider Notes (Signed)
CSN: [id number]     Arrival date & time 06/30/15  0945 History   First MD Initiated Contact with Patient 06/30/15 1016     Chief Complaint  Patient presents with  . Fall   HPI Patient presents to the emergency room for evaluation of pain associated with a fall. The patient was walking into the store yesterday. The ground was wet but she is not exactly sure why she fell. She thinks she may have slipped on the ground but she does not exactly recall the exact events. She does have history of having trouble with syncope in the past. She also has history of trouble with arthritis and joint aches associated with lupus so yesterday she was not too concerned with the symptoms that she was having. She does remember people helping her up off the floor and she decided not to seek any medical attention. Today however she is having trouble with some pain in her neck. She also has pain in her shoulder and the greatest amount of pain she is having is the top of her right foot and her big toe. She has been able to walk. No trouble with vomiting. No trouble with any chest pain or shortness of breath. Past Medical History  Diagnosis Date  . Hypertension   . Edema   . Pain in limb   . Back pain   . Asthma   . MVP (mitral valve prolapse) 12/22/2011  . S/P dilatation of esophageal stricture 12/22/2011  . Positive ANA (antinuclear antibody) 01/29/2015  . Lupus    Past Surgical History  Procedure Laterality Date  . Abdominal hysterectomy    . Cardiac catheterization    . Tonsillectomy    . Spine surgery     Family History  Problem Relation Age of Onset  . Diabetes Mother   . Hypertension Mother   . Heart disease Mother   . Diabetes Father   . Hypertension Father   . COPD Father   . Heart attack Father 60  . Stroke Other   . Coronary artery disease Other    Social History  Substance Use Topics  . Smoking status: Never Smoker   . Smokeless tobacco: Never Used  . Alcohol Use: No   OB History    No  data available     Review of Systems  All other systems reviewed and are negative.     Allergies  Morphine and related; Pantoprazole sodium; and Prednisone  Home Medications   Prior to Admission medications   Medication Sig Start Date End Date Taking? Authorizing Provider  amLODipine (NORVASC) 10 MG tablet Take 1 tablet (10 mg total) by mouth daily. 04/10/15  Yes Billy Fischer, MD  Multiple Vitamin (MULTIVITAMIN WITH MINERALS) TABS tablet Take 1 tablet by mouth daily.   Yes Historical Provider, MD  valsartan (DIOVAN) 160 MG tablet Take 160 mg by mouth daily. 03/29/15  Yes Historical Provider, MD  naproxen (NAPROSYN) 500 MG tablet Take 1 tablet (500 mg total) by mouth 2 (two) times daily. 06/30/15   Dorie Rank, MD   BP 151/84 mmHg  Pulse 51  Temp(Src) 97.7 F (36.5 C) (Oral)  Resp 18  SpO2 99% Physical Exam  Constitutional: She appears well-developed and well-nourished. No distress.  HENT:  Head: Normocephalic and atraumatic.  Right Ear: External ear normal.  Left Ear: External ear normal.  Eyes: Conjunctivae are normal. Right eye exhibits no discharge. Left eye exhibits no discharge. No scleral icterus.  Neck: Neck supple. No  tracheal deviation present.  Cardiovascular: Normal rate, regular rhythm and intact distal pulses.   Pulmonary/Chest: Effort normal and breath sounds normal. No stridor. No respiratory distress. She has no wheezes. She has no rales.  Abdominal: Soft. Bowel sounds are normal. She exhibits no distension. There is no tenderness. There is no rebound and no guarding.  Musculoskeletal: She exhibits no edema.       Right shoulder: She exhibits tenderness. She exhibits normal range of motion, no bony tenderness and no swelling.       Left shoulder: Normal.       Right elbow: Normal.      Right wrist: Normal.       Left hip: Normal.       Right knee: She exhibits no swelling, no effusion and no ecchymosis. Tenderness found.       Right ankle: Tenderness.        Cervical back: She exhibits tenderness.       Thoracic back: Normal.       Lumbar back: Normal.       Right foot: There is tenderness and bony tenderness.  Neurological: She is alert. She has normal strength. No cranial nerve deficit (no facial droop, extraocular movements intact, no slurred speech) or sensory deficit. She exhibits normal muscle tone. She displays no seizure activity. Coordination normal.  Skin: Skin is warm and dry. No rash noted.  Psychiatric: She has a normal mood and affect.  Nursing note and vitals reviewed.   ED Course  Procedures (including critical care time) Labs Review Labs Reviewed - No data to display  Imaging Review Dg Shoulder Right  06/30/2015   CLINICAL DATA:  Loss of consciousness, fall on right side. Right shoulder pain.  EXAM: RIGHT SHOULDER - 2+ VIEW  COMPARISON:  None.  FINDINGS: There is no evidence of fracture or dislocation. There is no evidence of arthropathy or other focal bone abnormality. Soft tissues are unremarkable.  IMPRESSION: Negative.   Electronically Signed   By: Rolm Baptise M.D.   On: 06/30/2015 11:02   Dg Knee 2 Views Right  06/30/2015   CLINICAL DATA:  Status post loss of consciousness with fall and pain in the right foot and ankle.  EXAM: RIGHT KNEE - 1-2 VIEW  COMPARISON:  None.  FINDINGS: There is no evidence of fracture, dislocation, or joint effusion. There is no evidence of arthropathy or other focal bone abnormality. Soft tissues are unremarkable.  IMPRESSION: Negative.   Electronically Signed   By: Abelardo Diesel M.D.   On: 06/30/2015 11:03   Dg Ankle Complete Right  06/30/2015   CLINICAL DATA:  Syncope, fall on right side.  Right ankle pain.  EXAM: RIGHT ANKLE - COMPLETE 3+ VIEW  COMPARISON:  None.  FINDINGS: There is no evidence of fracture, dislocation, or joint effusion. There is no evidence of arthropathy or other focal bone abnormality. Soft tissues are unremarkable.  IMPRESSION: Negative.   Electronically Signed   By: Rolm Baptise M.D.   On: 06/30/2015 11:02   Ct Head Wo Contrast  06/30/2015   CLINICAL DATA:  Fall yesterday, hit right side.  Right neck pain.  EXAM: CT HEAD WITHOUT CONTRAST  CT CERVICAL SPINE WITHOUT CONTRAST  TECHNIQUE: Multidetector CT imaging of the head and cervical spine was performed following the standard protocol without intravenous contrast. Multiplanar CT image reconstructions of the cervical spine were also generated.  COMPARISON:  04/08/2015  FINDINGS: CT HEAD FINDINGS  No acute intracranial abnormality. Specifically, no  hemorrhage, hydrocephalus, mass lesion, acute infarction, or significant intracranial injury. No acute calvarial abnormality. Visualized paranasal sinuses and mastoids clear. Orbital soft tissues unremarkable.  CT CERVICAL SPINE FINDINGS  Loss of normal cervical lordosis. Degenerative spurring and disc space narrowing from C 4 5 through C7-T1. Prevertebral soft tissues are normal. No fracture. No epidural or paraspinal hematoma.  IMPRESSION: No intracranial abnormality.  Cervical spondylosis.  No acute bony abnormality   Electronically Signed   By: Rolm Baptise M.D.   On: 06/30/2015 11:08   Ct Cervical Spine Wo Contrast  06/30/2015   CLINICAL DATA:  Fall yesterday, hit right side.  Right neck pain.  EXAM: CT HEAD WITHOUT CONTRAST  CT CERVICAL SPINE WITHOUT CONTRAST  TECHNIQUE: Multidetector CT imaging of the head and cervical spine was performed following the standard protocol without intravenous contrast. Multiplanar CT image reconstructions of the cervical spine were also generated.  COMPARISON:  04/08/2015  FINDINGS: CT HEAD FINDINGS  No acute intracranial abnormality. Specifically, no hemorrhage, hydrocephalus, mass lesion, acute infarction, or significant intracranial injury. No acute calvarial abnormality. Visualized paranasal sinuses and mastoids clear. Orbital soft tissues unremarkable.  CT CERVICAL SPINE FINDINGS  Loss of normal cervical lordosis. Degenerative spurring and  disc space narrowing from C 4 5 through C7-T1. Prevertebral soft tissues are normal. No fracture. No epidural or paraspinal hematoma.  IMPRESSION: No intracranial abnormality.  Cervical spondylosis.  No acute bony abnormality   Electronically Signed   By: Rolm Baptise M.D.   On: 06/30/2015 11:08   Dg Foot Complete Right  06/30/2015   CLINICAL DATA:  Syncope, fall on right side.  Right foot pain.  EXAM: RIGHT FOOT COMPLETE - 3+ VIEW  COMPARISON:  None.  FINDINGS: There is no evidence of fracture or dislocation. There is no evidence of arthropathy or other focal bone abnormality. Soft tissues are unremarkable.  IMPRESSION: Negative.   Electronically Signed   By: Rolm Baptise M.D.   On: 06/30/2015 11:03      EKG Interpretation   Date/Time:  Sunday June 30 2015 11:18:45 EDT Ventricular Rate:  50 PR Interval:  158 QRS Duration: 104 QT Interval:  474 QTC Calculation: 432 R Axis:   17 Text Interpretation:  Sinus rhythm RSR' in V1 or V2, probably normal  variant No significant change since last tracing Confirmed by Kelvin Sennett  MD-J,  Maryrose Colvin ([id number]) on 06/30/2015 11:23:33 AM      MDM   Final diagnoses:  Muscle strain  Foot sprain, right, initial encounter    Patient's x-rays do not demonstrate any significant injury. Consistent with soft tissue injury and strain. Plan on discharge home with medications for pain. She'll be given a postop shoe and crutches for comfort.  I suspect that her fall was most likely mechanical related to slipping on a wet floor.  I certainly can't exclude syncope but the patient is not in any distress at this point and we are greater than 24 hours from the event.  Nl heart rhythm.  No hx of CAD or CHF.  No indication for hospitalization.   Dorie Rank, MD 06/30/15 1150

## 2015-06-30 NOTE — ED Notes (Signed)
Patient was able to ambulate from the lobby to triage room. Patient stated that fall was yesterday.

## 2015-06-30 NOTE — ED Notes (Signed)
Patient states that she was at [employer] yesterday and fell in the store, not sure how she fell, just remembers that she was on the floor, patient states that she was really confused after she fell, not sure if she hit her head or not. patient does have hx/o syncope. Patient is c/o pain in her neck on the right side, lower back pain and pain in her right big toe and foot that radiates up her leg.

## 2015-08-02 ENCOUNTER — Emergency Department (HOSPITAL_COMMUNITY): Payer: BLUE CROSS/BLUE SHIELD

## 2015-08-02 ENCOUNTER — Encounter (HOSPITAL_COMMUNITY): Payer: Self-pay | Admitting: Emergency Medicine

## 2015-08-02 ENCOUNTER — Observation Stay (HOSPITAL_COMMUNITY)
Admission: EM | Admit: 2015-08-02 | Discharge: 2015-08-03 | DRG: 069 | Disposition: A | Discharge status: Home / Self Care | Payer: BLUE CROSS/BLUE SHIELD | Attending: Internal Medicine | Admitting: Internal Medicine

## 2015-08-02 DIAGNOSIS — Z9114 Patient's other noncompliance with medication regimen: Secondary | ICD-10-CM

## 2015-08-02 DIAGNOSIS — Z823 Family history of stroke: Secondary | ICD-10-CM | POA: Diagnosis not present

## 2015-08-02 DIAGNOSIS — Z7982 Long term (current) use of aspirin: Secondary | ICD-10-CM | POA: Diagnosis not present

## 2015-08-02 DIAGNOSIS — R0789 Other chest pain: Secondary | ICD-10-CM | POA: Diagnosis not present

## 2015-08-02 DIAGNOSIS — I110 Hypertensive heart disease with heart failure: Secondary | ICD-10-CM | POA: Diagnosis not present

## 2015-08-02 DIAGNOSIS — Z79899 Other long term (current) drug therapy: Secondary | ICD-10-CM | POA: Diagnosis not present

## 2015-08-02 DIAGNOSIS — E785 Hyperlipidemia, unspecified: Secondary | ICD-10-CM | POA: Diagnosis present

## 2015-08-02 DIAGNOSIS — G458 Other transient cerebral ischemic attacks and related syndromes: Secondary | ICD-10-CM | POA: Diagnosis present

## 2015-08-02 DIAGNOSIS — G459 Transient cerebral ischemic attack, unspecified: Secondary | ICD-10-CM | POA: Diagnosis not present

## 2015-08-02 DIAGNOSIS — M6249 Contracture of muscle, multiple sites: Secondary | ICD-10-CM

## 2015-08-02 DIAGNOSIS — I341 Nonrheumatic mitral (valve) prolapse: Secondary | ICD-10-CM | POA: Diagnosis not present

## 2015-08-02 DIAGNOSIS — G8929 Other chronic pain: Secondary | ICD-10-CM | POA: Diagnosis not present

## 2015-08-02 DIAGNOSIS — R42 Dizziness and giddiness: Secondary | ICD-10-CM

## 2015-08-02 DIAGNOSIS — M329 Systemic lupus erythematosus, unspecified: Secondary | ICD-10-CM | POA: Diagnosis present

## 2015-08-02 DIAGNOSIS — M5030 Other cervical disc degeneration, unspecified cervical region: Secondary | ICD-10-CM | POA: Diagnosis present

## 2015-08-02 DIAGNOSIS — R4182 Altered mental status, unspecified: Secondary | ICD-10-CM

## 2015-08-02 DIAGNOSIS — M6248 Contracture of muscle, other site: Secondary | ICD-10-CM

## 2015-08-02 DIAGNOSIS — Z9889 Other specified postprocedural states: Secondary | ICD-10-CM

## 2015-08-02 DIAGNOSIS — R768 Other specified abnormal immunological findings in serum: Secondary | ICD-10-CM | POA: Diagnosis present

## 2015-08-02 DIAGNOSIS — M549 Dorsalgia, unspecified: Secondary | ICD-10-CM | POA: Diagnosis not present

## 2015-08-02 DIAGNOSIS — I10 Essential (primary) hypertension: Secondary | ICD-10-CM

## 2015-08-02 DIAGNOSIS — F411 Generalized anxiety disorder: Secondary | ICD-10-CM | POA: Diagnosis not present

## 2015-08-02 DIAGNOSIS — R0602 Shortness of breath: Secondary | ICD-10-CM | POA: Diagnosis present

## 2015-08-02 DIAGNOSIS — Z91148 Patient's other noncompliance with medication regimen for other reason: Secondary | ICD-10-CM | POA: Diagnosis present

## 2015-08-02 DIAGNOSIS — I5030 Unspecified diastolic (congestive) heart failure: Secondary | ICD-10-CM | POA: Diagnosis not present

## 2015-08-02 DIAGNOSIS — R404 Transient alteration of awareness: Secondary | ICD-10-CM | POA: Diagnosis present

## 2015-08-02 DIAGNOSIS — J45909 Unspecified asthma, uncomplicated: Secondary | ICD-10-CM | POA: Diagnosis not present

## 2015-08-02 DIAGNOSIS — Z8719 Personal history of other diseases of the digestive system: Secondary | ICD-10-CM

## 2015-08-02 DIAGNOSIS — M62838 Other muscle spasm: Secondary | ICD-10-CM | POA: Diagnosis present

## 2015-08-02 LAB — COMPREHENSIVE METABOLIC PANEL
ALK PHOS: 89 U/L (ref 38–126)
ALT: 16 U/L (ref 14–54)
ANION GAP: 12 (ref 5–15)
AST: 23 U/L (ref 15–41)
Albumin: 4.2 g/dL (ref 3.5–5.0)
BILIRUBIN TOTAL: 0.7 mg/dL (ref 0.3–1.2)
BUN: 12 mg/dL (ref 6–20)
CALCIUM: 9.5 mg/dL (ref 8.9–10.3)
CO2: 25 mmol/L (ref 22–32)
Chloride: 104 mmol/L (ref 101–111)
Creatinine, Ser: 0.84 mg/dL (ref 0.44–1.00)
Glucose, Bld: 110 mg/dL — ABNORMAL HIGH (ref 65–99)
Potassium: 3.5 mmol/L (ref 3.5–5.1)
SODIUM: 141 mmol/L (ref 135–145)
TOTAL PROTEIN: 7.2 g/dL (ref 6.5–8.1)

## 2015-08-02 LAB — CBC
HCT: 38.3 % (ref 36.0–46.0)
Hemoglobin: 12.8 g/dL (ref 12.0–15.0)
MCH: 30.3 pg (ref 26.0–34.0)
MCHC: 33.4 g/dL (ref 30.0–36.0)
MCV: 90.8 fL (ref 78.0–100.0)
PLATELETS: 183 10*3/uL (ref 150–400)
RBC: 4.22 MIL/uL (ref 3.87–5.11)
RDW: 12.9 % (ref 11.5–15.5)
WBC: 5.8 10*3/uL (ref 4.0–10.5)

## 2015-08-02 LAB — URINALYSIS, ROUTINE W REFLEX MICROSCOPIC
Bilirubin Urine: NEGATIVE
GLUCOSE, UA: NEGATIVE mg/dL
Hgb urine dipstick: NEGATIVE
Ketones, ur: NEGATIVE mg/dL
LEUKOCYTES UA: NEGATIVE
NITRITE: NEGATIVE
PH: 7 (ref 5.0–8.0)
Protein, ur: NEGATIVE mg/dL
Specific Gravity, Urine: 1.01 (ref 1.005–1.030)
Urobilinogen, UA: 0.2 mg/dL (ref 0.0–1.0)

## 2015-08-02 LAB — ETHANOL: Alcohol, Ethyl (B): <5 mg/dL (ref <5)

## 2015-08-02 LAB — RAPID URINE DRUG SCREEN, HOSP PERFORMED
Amphetamines: NOT DETECTED
BENZODIAZEPINES: NOT DETECTED
Barbiturates: NOT DETECTED
COCAINE: NOT DETECTED
OPIATES: NOT DETECTED
Tetrahydrocannabinol: NOT DETECTED

## 2015-08-02 LAB — I-STAT CHEM 8, ED
BUN: 17 mg/dL (ref 6–20)
Calcium, Ion: 1.12 mmol/L (ref 1.12–1.23)
Chloride: 101 mmol/L (ref 101–111)
Creatinine, Ser: 1 mg/dL (ref 0.44–1.00)
GLUCOSE: 110 mg/dL — AB (ref 65–99)
HEMATOCRIT: 41 % (ref 36.0–46.0)
HEMOGLOBIN: 13.9 g/dL (ref 12.0–15.0)
POTASSIUM: 3.5 mmol/L (ref 3.5–5.1)
Sodium: 140 mmol/L (ref 135–145)
TCO2: 27 mmol/L (ref 0–100)

## 2015-08-02 LAB — DIFFERENTIAL
Basophils Absolute: 0 10*3/uL (ref 0.0–0.1)
Basophils Relative: 1 %
EOS PCT: 3 %
Eosinophils Absolute: 0.2 10*3/uL (ref 0.0–0.7)
LYMPHS ABS: 1.9 10*3/uL (ref 0.7–4.0)
LYMPHS PCT: 33 %
MONO ABS: 0.3 10*3/uL (ref 0.1–1.0)
MONOS PCT: 5 %
Neutro Abs: 3.4 10*3/uL (ref 1.7–7.7)
Neutrophils Relative %: 59 %

## 2015-08-02 LAB — APTT: aPTT: 34 seconds (ref 24–37)

## 2015-08-02 LAB — PROTIME-INR
INR: 0.96 (ref 0.00–1.49)
PROTHROMBIN TIME: 13 s (ref 11.6–15.2)

## 2015-08-02 LAB — I-STAT TROPONIN, ED: TROPONIN I, POC: 0 ng/mL (ref 0.00–0.08)

## 2015-08-02 MED ORDER — LORAZEPAM 1 MG PO TABS
1.0000 mg | ORAL_TABLET | Freq: Once | ORAL | Status: AC
Start: 2015-08-02 — End: 2015-08-02
  Administered 2015-08-02: 1 mg via ORAL
  Filled 2015-08-02: qty 1

## 2015-08-02 MED ORDER — ASPIRIN 325 MG PO TABS
325.0000 mg | ORAL_TABLET | Freq: Every day | ORAL | Status: DC
Start: 2015-08-03 — End: 2015-08-03
  Administered 2015-08-03: 325 mg via ORAL
  Filled 2015-08-02: qty 1

## 2015-08-02 MED ORDER — ENOXAPARIN SODIUM 40 MG/0.4ML ~~LOC~~ SOLN
40.0000 mg | SUBCUTANEOUS | Status: DC
  Administered 2015-08-02: 40 mg via SUBCUTANEOUS
  Filled 2015-08-02: qty 0.4

## 2015-08-02 MED ORDER — SENNOSIDES-DOCUSATE SODIUM 8.6-50 MG PO TABS
1.0000 | ORAL_TABLET | Freq: Every day | ORAL | Status: DC
Start: 2015-08-03 — End: 2015-08-03
  Administered 2015-08-03: 1 via ORAL
  Filled 2015-08-02: qty 1

## 2015-08-02 MED ORDER — ASPIRIN 300 MG RE SUPP: 300.0000 mg | Freq: Every day | RECTAL | Status: DC

## 2015-08-02 MED ORDER — STROKE: EARLY STAGES OF RECOVERY BOOK
Freq: Once | Status: AC
Start: 2015-08-02 — End: 2015-08-02
  Administered 2015-08-02: 23:00:00

## 2015-08-02 NOTE — H&P (Signed)
[facility] History and Physical  [name] GHW:[id number] DOB: 06/15/1961 DOA: 08/02/2015   PCP: [name], MD    Chief Complaint: TIA?  HPI: [name] is an 54 y.o.BF PMHx Non-Compliance with Medication, HTN, MVP, positive ANA, lupus, chronic back pain.  Who works at [employer]. She was at work today when staff noted she was not acting correctly. EMS was called. On arrival she appeared "spaced out" and complained of nausea. On arrival she stated she did "not feel herself, initially had arm pain then chest pressure/pain which then changed to dizziness. She currently can follow all commands, is holding her arms up and noting they do not look ar seem right. Patient shows no major focal neuro deficits. CT head is normal. HE BP is elevated at 174/104 but patient notes she has not been taking her BP medications in a month due to the fact they make her feel weird.  States also had L-spine surgery in October 2016 and since then has had paresthesia bilateral feet. Has also had increasing pain in her neck with increasing paresthesia bilateral upper extremity. States neurosurgeon has informed her she needs to have C-spine surgery.   Review of Systems: The patient denies anorexia, fever, weight loss,, vision loss, decreased hearing, hoarseness, chest pain, syncope, dyspnea on exertion, peripheral edema, balance deficits, hemoptysis, abdominal pain, melena, hematochezia, severe indigestion/heartburn, hematuria, incontinence, genital sores,  suspicious skin lesions, transient blindness, difficulty walking, depression, abnormal bleeding, enlarged lymph nodes, angioedema, and breast masses.    TRAVEL HISTORY: None   Consultants:  Dr.McNeill P Kirkpatrick (neuro hospitalist)   Procedure/Significant Events:  9/30 MRI brain without contrast;-negative acute infarct-C3-4 bulge and mild spinal stenosis-negative PRESS syndrome    Culture     Antibiotics:     DVT prophylaxis:   Lovenox  Devices     LINES / TUBES:     Past Medical History  Diagnosis Date  . Hypertension   . Edema   . Pain in limb   . Back pain   . Asthma   . MVP (mitral valve prolapse) 12/22/2011  . S/P dilatation of esophageal stricture 12/22/2011  . Positive ANA (antinuclear antibody) 01/29/2015  . Lupus    Past Surgical History  Procedure Laterality Date  . Abdominal hysterectomy    . Cardiac catheterization    . Tonsillectomy    . Spine surgery     Social History:  reports that she has never smoked. She has never used smokeless tobacco. She reports that she does not drink alcohol or use illicit drugs. where does patient live--home, ALF, SNF? and with whom if at home? Home alone  Can patient participate in ADLs? Yes  Allergies  Allergen Reactions  . Morphine And Related Other (See Comments)    hallucinations  . Other Other (See Comments)    Pt states allergic to "any narcotics"  . Pantoprazole Sodium Other (See Comments)    blisters  . Prednisone Other (See Comments)    Blisters on tongue    Family History  Problem Relation Age of Onset  . Diabetes Mother   . Hypertension Mother   . Heart disease Mother   . Diabetes Father   . Hypertension Father   . COPD Father   . Heart attack Father 35  . Stroke Other   . Coronary artery disease Other         Prior to Admission medications   Medication Sig Start Date End Date Taking? Authorizing Provider  amLODipine (NORVASC) 5  MG tablet Take 5 mg by mouth daily. 07/22/15  Yes Historical Provider, MD  aspirin 81 MG chewable tablet Chew 81 mg by mouth daily.   Yes Historical Provider, MD  Multiple Vitamin (MULTIVITAMIN WITH MINERALS) TABS tablet Take 1 tablet by mouth daily.   Yes Historical Provider, MD  valsartan (DIOVAN) 160 MG tablet Take 160 mg by mouth daily. 03/29/15  Yes Historical Provider, MD  amLODipine (NORVASC) 10 MG tablet Take 1 tablet (10 mg total) by mouth daily. Patient not taking: Reported on 08/02/2015  04/10/15   Billy Fischer, MD  naproxen (NAPROSYN) 500 MG tablet Take 1 tablet (500 mg total) by mouth 2 (two) times daily. Patient not taking: Reported on 08/02/2015 06/30/15   Dorie Rank, MD   Physical Exam: Filed Vitals:   08/02/15 2145 08/02/15 2228 08/03/15 0031 08/03/15 0230  BP: 130/72 124/89 145/79 132/67  Pulse: 64 68 63 60  Temp:  97.7 F (36.5 C) 98.2 F (36.8 C) 98.8 F (37.1 C)  TempSrc:  Oral Oral Oral  Resp:  18 20 16   Height:  5\' 6"  (1.676 m)    Weight:  99.1 kg (218 lb 7.6 oz)    SpO2: 95% 95% 98% 99%     General: A/O 4, neck pain, No acute respiratory distress Eyes: Positive headache, negative eye pain, double vision, scotomas, floaters, negative scleral hemorrhage ENT: Negative Runny nose, negative ear pain, negative tinnitus, negative gingival bleeding, negative odynophagia Neck:  Positive neck pain C3 to C5 midline palpation, left side upper trapezius and paracervical muscle spasms, Negative scars, masses, torticollis, lymphadenopathy, JVD Lungs: Clear to auscultation bilaterally without wheezes or crackles Cardiovascular: Regular rate and rhythm without murmur gallop or rub normal S1 and S2 Abdomen:negative abdominal pain, negative dysphagia, Nontender, nondistended, soft, bowel sounds positive, no rebound, no ascites, no appreciable mass Extremities: No significant cyanosis, clubbing, or edema bilateral lower extremities Psychiatric:  Negative depression, negative anxiety, negative fatigue, negative mania Neurologic:  Cranial nerves II through XII intact, tongue/uvula midline, all extremities muscle strength 5/5, paresthesia bilateral upper extremities (worsening over last few months), paresthesia bilateral feet (chronic since L spine surgery),negative dysarthria, negative expressive aphasia, negative receptive aphasia.  Endocrine;   Hyperthyroid Negative sweaty, palpitations, visual disturbances, palpitations, visual disturbances, tremors.  HYPOTHYROID; negative  tired, negative depressed, negative thin hair, negative croaky voice, . HYPOGLYCEMIA positive dizziness, negative sweating, positive headache, Skin/Hemolytic/lymphatic; negative purpura, petechia,     Labs on Admission:  Basic Metabolic Panel:  Recent Labs Lab 08/02/15 1300 08/02/15 1304  NA 141 140  K 3.5 3.5  CL 104 101  CO2 25  --   GLUCOSE 110* 110*  BUN 12 17  CREATININE 0.84 1.00  CALCIUM 9.5  --    Liver Function Tests:  Recent Labs Lab 08/02/15 1300  AST 23  ALT 16  ALKPHOS 89  BILITOT 0.7  PROT 7.2  ALBUMIN 4.2   No results for input(s): LIPASE, AMYLASE in the last 168 hours. No results for input(s): AMMONIA in the last 168 hours. CBC:  Recent Labs Lab 08/02/15 1300 08/02/15 1304  WBC 5.8  --   NEUTROABS 3.4  --   HGB 12.8 13.9  HCT 38.3 41.0  MCV 90.8  --   PLT 183  --    Cardiac Enzymes: No results for input(s): CKTOTAL, CKMB, CKMBINDEX, TROPONINI in the last 168 hours.  BNP (last 3 results) No results for input(s): BNP in the last 8760 hours.  ProBNP (last 3 results) No results  for input(s): PROBNP in the last 8760 hours.  CBG: No results for input(s): GLUCAP in the last 168 hours.  Radiological Exams on Admission: Ct Head Wo Contrast  08/02/2015   ADDENDUM REPORT: 08/02/2015 13:23 ADDENDUM: These results were called by telephone at the time of interpretation on 08/02/2015 at 1:21 pm to Dr. Leonel Ramsay who verbally acknowledged these results. Electronically Signed   By: Genia Del M.D.   On: 08/02/2015 13:23  08/02/2015   CLINICAL DATA:  54 year old hypertensive female with confusion. Lupus. Subsequent encounter.  EXAM: CT HEAD WITHOUT CONTRAST  TECHNIQUE: Contiguous axial images were obtained from the base of the skull through the vertex without intravenous contrast.  COMPARISON:  06/30/2015 head CT.  02/25/2015 brain MR.  FINDINGS: No intracranial hemorrhage.  Mild hypodensity periatrial region similar to prior head CT may be related to  result of small vessel disease rather than posterior reversible encephalopathy syndrome. No CT evidence of large acute infarct.  No intracranial mass lesion noted on this unenhanced exam.  No hydrocephalus.  Mastoid air cells, middle ear cavities and visualized paranasal sinuses are clear.  Orbital structures unremarkable.  No calvarial abnormality.  IMPRESSION: No intracranial hemorrhage.  Mild hypodensity periatrial region similar to prior head CT may be related to result of small vessel disease rather than posterior reversible encephalopathy syndrome. No CT evidence of large acute infarct.  Call is into Dr. Leonel Ramsay.  Electronically Signed: By: Genia Del M.D. On: 08/02/2015 13:20   Mr Angiogram Head Wo Contrast  08/02/2015   CLINICAL DATA:  54 year old hypertensive female with history of lupus presenting with change in mental status. Subsequent encounter.  EXAM: MRI HEAD WITHOUT CONTRAST  MRA HEAD WITHOUT CONTRAST  TECHNIQUE: Multiplanar, multiecho pulse sequences of the brain and surrounding structures were obtained without intravenous contrast. Angiographic images of the head were obtained using MRA technique without contrast.  COMPARISON:  08/02/2015 head CT.  02/25/2015 brain MR.  FINDINGS: MRI HEAD FINDINGS  No acute infarct.  No evidence of posterior reversible encephalopathy syndrome.  No intracranial hemorrhage.  No hydrocephalus.  No intracranial mass lesion noted on this unenhanced exam.  C3-4 bulge and mild spinal stenosis.  Partially empty non expanded sella felt to be an incidental finding without secondary findings of pseudotumor cerebri.  MRA HEAD FINDINGS  Anterior circulation without medium or large size vessel significant stenosis or occlusion. Small linear artifact extends through the A1 segment of the right anterior cerebral artery.  Right vertebral artery is dominant. Left vertebral artery is small, narrowed and irregular after takeoff of the left posterior inferior cerebellar  artery.  No significant stenosis of the basilar artery.  Nonvisualized right anterior inferior cerebellar artery.  Fetal type contribution to the right posterior cerebral artery.  Artifact versus minimal narrowing P2 segment left posterior cerebral artery.  Slightly bulbous appearance of the basilar tip without aneurysm.  IMPRESSION: MRI HEAD  No acute infarct.  No evidence of posterior reversible encephalopathy syndrome.  No intracranial hemorrhage.  No hydrocephalus.  No intracranial mass lesion noted on this unenhanced exam.  C3-4 bulge and mild spinal stenosis.  MRA HEAD  Anterior circulation without medium or large size vessel significant stenosis or occlusion. Small linear artifact extends through the A1 segment of the right anterior cerebral artery.  Right vertebral artery is dominant.  Left vertebral artery is small, narrowed and irregular after takeoff of the left posterior inferior cerebellar artery.  No significant stenosis of the basilar artery.  Nonvisualized right anterior inferior cerebellar artery.  Artifact versus minimal narrowing P2 segment left posterior cerebral artery.   Electronically Signed   By: Genia Del M.D.   On: 08/02/2015 18:49   Mr Brain Wo Contrast  08/02/2015   CLINICAL DATA:  54 year old hypertensive female with history of lupus presenting with change in mental status. Subsequent encounter.  EXAM: MRI HEAD WITHOUT CONTRAST  MRA HEAD WITHOUT CONTRAST  TECHNIQUE: Multiplanar, multiecho pulse sequences of the brain and surrounding structures were obtained without intravenous contrast. Angiographic images of the head were obtained using MRA technique without contrast.  COMPARISON:  08/02/2015 head CT.  02/25/2015 brain MR.  FINDINGS: MRI HEAD FINDINGS  No acute infarct.  No evidence of posterior reversible encephalopathy syndrome.  No intracranial hemorrhage.  No hydrocephalus.  No intracranial mass lesion noted on this unenhanced exam.  C3-4 bulge and mild spinal stenosis.   Partially empty non expanded sella felt to be an incidental finding without secondary findings of pseudotumor cerebri.  MRA HEAD FINDINGS  Anterior circulation without medium or large size vessel significant stenosis or occlusion. Small linear artifact extends through the A1 segment of the right anterior cerebral artery.  Right vertebral artery is dominant. Left vertebral artery is small, narrowed and irregular after takeoff of the left posterior inferior cerebellar artery.  No significant stenosis of the basilar artery.  Nonvisualized right anterior inferior cerebellar artery.  Fetal type contribution to the right posterior cerebral artery.  Artifact versus minimal narrowing P2 segment left posterior cerebral artery.  Slightly bulbous appearance of the basilar tip without aneurysm.  IMPRESSION: MRI HEAD  No acute infarct.  No evidence of posterior reversible encephalopathy syndrome.  No intracranial hemorrhage.  No hydrocephalus.  No intracranial mass lesion noted on this unenhanced exam.  C3-4 bulge and mild spinal stenosis.  MRA HEAD  Anterior circulation without medium or large size vessel significant stenosis or occlusion. Small linear artifact extends through the A1 segment of the right anterior cerebral artery.  Right vertebral artery is dominant.  Left vertebral artery is small, narrowed and irregular after takeoff of the left posterior inferior cerebellar artery.  No significant stenosis of the basilar artery.  Nonvisualized right anterior inferior cerebellar artery.  Artifact versus minimal narrowing P2 segment left posterior cerebral artery.   Electronically Signed   By: Genia Del M.D.   On: 08/02/2015 18:49    EKG: No significant change from previous EKG  Assessment/Plan Principal Problem:   TIA (transient ischemic attack) Active Problems:   Hypertension   Atypical chest pain   Hyperlipidemia   MVP (mitral valve prolapse)   S/P dilatation of esophageal stricture   Generalized anxiety  disorder   DDD (degenerative disc disease), cervical   Shortness of breath   Dizzy spells   Positive ANA (antinuclear antibody)   Cervical paraspinal muscle spasm   Trapezius muscle spasm   Other specified transient cerebral ischemias   Alteration consciousness   Essential hypertension   Noncompliance with medication regimen   TIA/dizzy spells -Counseled patient symptoms most consistent with TIA, and if she did not get control of her BP would inevitably have a stroke or MI or both. -Neuro deficits resolved -Continue neuro checks q 4hr  Atypical chest pain/mitral valve prolapse -Patient has had several years of uncontrolled BP will rule out CHF. Echocardiogram pending -Trend troponin  Essential hypertension/noncompliance with medication -In past patient states had excess with enalapril. Restart enalapril 5 mg daily and titrate for effect  Cervical degenerative disc disease/C-spine paracervical and trapezius muscle spasm  -Patient  has C3-4 bulge and mild spinal stenosis, per patient neurosurgery has recommended surgery. -Consider obtaining dedicated C-spine MRI. -In meantime will treat muscle spasms start baclofen 20 mg TID   Positive ANA/lupus -Patient states was on prednisone but stopped secondary to side effects -Would defer starting any treatment to patient's rheumatologist       Code Status: Full Family Communication: None Disposition Plan: Resolution neurological signs and symptoms   Care during the described time interval was provided by me .  I have reviewed this patient's available data, including medical history, events of note, physical examination, and all test results as part of my evaluation. I have personally reviewed and interpreted all radiology studies.    Time spent: 70 minutes  Allie Bossier [facility] Pager [phone]  If 7PM-7AM, please contact night-coverage [email or web address] Password [email or web address] 08/03/2015, 2:58 AM

## 2015-08-02 NOTE — ED Provider Notes (Signed)
CSN: [id number]     Arrival date & time 08/02/15  1255 History   First MD Initiated Contact with Patient 08/02/15 1315     Chief Complaint  Patient presents with  . Code Stroke    An emergency department physician performed an initial assessment on this suspected stroke patient at 1257. (Consider location/radiation/quality/duration/timing/severity/associated sxs/prior Treatment) The history is provided by the patient.   [name] is a 54 y.o. female Presents for evaluation of dizziness which started this morning and is similar to multiple prior episodes of dizziness. She thought it was because her blood pressure was elevated.She also noticed pain in her left arm and leg at that time. Earlier today she had seen a chiropractor because she was having pain in her right leg last night. That pain was severe, but has resolved today.she has a mild headache today. She denies blurred vision. She was with coworkers today at work when he called me months because they thought that she had altered mental status. EMS called a code stroke, and she was seen immediately by neurology, who initiated evaluation and monitoring. I talked to Dr. Katherine Roan, who asked me to evaluate the patient and arrange for admission. The patient denies recent fever, chills, cough, shortness of breath, chest pain, or change in bowel and urine habits. There are no other known modifying factors.     Past Medical History  Diagnosis Date  . Hypertension   . Edema   . Pain in limb   . Back pain   . Asthma   . MVP (mitral valve prolapse) 12/22/2011  . S/P dilatation of esophageal stricture 12/22/2011  . Positive ANA (antinuclear antibody) 01/29/2015  . Lupus    Past Surgical History  Procedure Laterality Date  . Abdominal hysterectomy    . Cardiac catheterization    . Tonsillectomy    . Spine surgery     Family History  Problem Relation Age of Onset  . Diabetes Mother   . Hypertension Mother   . Heart disease Mother   .  Diabetes Father   . Hypertension Father   . COPD Father   . Heart attack Father 22  . Stroke Other   . Coronary artery disease Other    Social History  Substance Use Topics  . Smoking status: Never Smoker   . Smokeless tobacco: Never Used  . Alcohol Use: No   OB History    No data available     Review of Systems  All other systems reviewed and are negative.     Allergies  Morphine and related; Pantoprazole sodium; and Prednisone  Home Medications   Prior to Admission medications   Medication Sig Start Date End Date Taking? Authorizing Provider  amLODipine (NORVASC) 10 MG tablet Take 1 tablet (10 mg total) by mouth daily. 04/10/15   Billy Fischer, MD  Multiple Vitamin (MULTIVITAMIN WITH MINERALS) TABS tablet Take 1 tablet by mouth daily.    Historical Provider, MD  naproxen (NAPROSYN) 500 MG tablet Take 1 tablet (500 mg total) by mouth 2 (two) times daily. 06/30/15   Dorie Rank, MD  valsartan (DIOVAN) 160 MG tablet Take 160 mg by mouth daily. 03/29/15   Historical Provider, MD   BP 164/91 mmHg  Pulse 64  Temp(Src) 98.2 F (36.8 C) (Oral)  Resp 18  Ht 5\' 6"  (1.676 m)  Wt 219 lb (99.338 kg)  BMI 35.36 kg/m2  SpO2 100% Physical Exam  Constitutional: She is oriented to person, place, and time.  She appears well-developed and well-nourished.  HENT:  Head: Normocephalic and atraumatic.  Right Ear: External ear normal.  Left Ear: External ear normal.  Eyes: Conjunctivae and EOM are normal. Pupils are equal, round, and reactive to light.  Neck: Normal range of motion and phonation normal. Neck supple.  Cardiovascular: Normal rate, regular rhythm and normal heart sounds.   Pulmonary/Chest: Effort normal and breath sounds normal. She exhibits no bony tenderness.  Abdominal: Soft. There is no tenderness.  Musculoskeletal: Normal range of motion.  Neurological: She is alert and oriented to person, place, and time. No cranial nerve deficit or sensory deficit. She exhibits normal  muscle tone. Coordination normal.  No dysarthria and aphasia or nystagmus. No ataxia. Normal strength and sensation in arms and legs bilaterally.  Skin: Skin is warm, dry and intact.  Psychiatric: She has a normal mood and affect. Her behavior is normal. Judgment and thought content normal.  Nursing note and vitals reviewed.   ED Course  Procedures (including critical care time)  Medications - No data to display  Patient Vitals for the past 24 hrs:  BP Temp Temp src Pulse Resp SpO2 Height Weight  08/02/15 1515 140/78 mmHg - - 70 17 95 % - -  08/02/15 1500 137/92 mmHg - - 64 19 100 % - -  08/02/15 1445 164/91 mmHg - - 64 18 100 % - -  08/02/15 1432 (!) 148/102 mmHg - - 66 19 97 % - -  08/02/15 1415 150/90 mmHg - - 60 18 96 % - -  08/02/15 1400 157/94 mmHg - - 60 16 95 % - -  08/02/15 1330 153/91 mmHg - - 62 19 97 % - -  08/02/15 1316 (!) 174/106 mmHg 98.2 F (36.8 C) Oral 65 18 98 % 5\' 6"  (1.676 m) 219 lb (99.338 kg)  08/02/15 1314 - - - - - 98 % - -    3:23 PM Reevaluation with update and discussion. After initial assessment and treatment, an updated evaluation reveals patient does not want to go to MRI now. She is concerned about claustrophobia. She updated on findings. Her daughter is with her at this time. WENTZ,ELLIOTT L   18:10- she is currently in MRI. She required time and continued reassurance, to go through the MRI procedure. Neuro- hospitalist wants to give final recommendations after MRI imaging is complete.  Labs Review Labs Reviewed  COMPREHENSIVE METABOLIC PANEL - Abnormal; Notable for the following:    Glucose, Bld 110 (*)    All other components within normal limits  I-STAT CHEM 8, ED - Abnormal; Notable for the following:    Glucose, Bld 110 (*)    All other components within normal limits  ETHANOL  PROTIME-INR  APTT  CBC  DIFFERENTIAL  URINE RAPID DRUG SCREEN, HOSP PERFORMED  URINALYSIS, ROUTINE W REFLEX MICROSCOPIC (NOT AT [facility])  Randolm Idol, ED     Imaging Review Ct Head Wo Contrast  08/02/2015   ADDENDUM REPORT: 08/02/2015 13:23 ADDENDUM: These results were called by telephone at the time of interpretation on 08/02/2015 at 1:21 pm to Dr. Leonel Ramsay who verbally acknowledged these results. Electronically Signed   By: Genia Del M.D.   On: 08/02/2015 13:23  08/02/2015   CLINICAL DATA:  54 year old hypertensive female with confusion. Lupus. Subsequent encounter.  EXAM: CT HEAD WITHOUT CONTRAST  TECHNIQUE: Contiguous axial images were obtained from the base of the skull through the vertex without intravenous contrast.  COMPARISON:  06/30/2015 head CT.  02/25/2015 brain MR.  FINDINGS: No intracranial hemorrhage.  Mild hypodensity periatrial region similar to prior head CT may be related to result of small vessel disease rather than posterior reversible encephalopathy syndrome. No CT evidence of large acute infarct.  No intracranial mass lesion noted on this unenhanced exam.  No hydrocephalus.  Mastoid air cells, middle ear cavities and visualized paranasal sinuses are clear.  Orbital structures unremarkable.  No calvarial abnormality.  IMPRESSION: No intracranial hemorrhage.  Mild hypodensity periatrial region similar to prior head CT may be related to result of small vessel disease rather than posterior reversible encephalopathy syndrome. No CT evidence of large acute infarct.  Call is into Dr. Leonel Ramsay.  Electronically Signed: By: Genia Del M.D. On: 08/02/2015 13:20   I have personally reviewed and evaluated these images and lab results as part of my medical decision-making.   EKG Interpretation None      MDM   Final diagnoses:  Alteration consciousness    Nonspecific symptoms of dizziness, paresthesias, and altered mental status.  Nursing Notes Reviewed/ Care Coordinated, and agree without changes. Applicable Imaging Reviewed.  Interpretation of Laboratory Data incorporated into ED treatment   Disposition- as per neuro  hospitalist in conjunction with oncoming provider team   Daleen Bo, MD 08/05/15 [phone]

## 2015-08-02 NOTE — ED Notes (Signed)
Pt still refusing IV

## 2015-08-02 NOTE — ED Notes (Signed)
Spoke with pt about MRI/IV/PO medication wishes.  Pt states she has a low tolerance to medications and would like to try a low dose.  Pt wanted to know the difference between xanax and ativan and why we typically offer ativan.  Pt interested in trying low dose ativan and trying to complete test.

## 2015-08-02 NOTE — ED Notes (Signed)
Pt is refusing an IV at this time. Pt made aware we can use a numbing spray she states to give her more time. Dr.Wentz made aware.

## 2015-08-02 NOTE — ED Notes (Signed)
Report attempted 

## 2015-08-02 NOTE — Code Documentation (Signed)
She stated that she has felt dizzy this morning and does not "feel like herself".  LKW at noon today.  Patient arrived via EMS. Stat Labs and Head CT done.  NIHSS 0 patient states that she occ feels nauseated, and had pain/pressure in her chest.  12 lead EKG done.  She states that her arm does not look like her own, it does not "look right".  She states that she is scared, doesn't feel "right".  Dr Leonel Ramsay at bedside to assess patient.  Daughter at bedside.  She has not been taking her BP meds because they make her feel "weird".  She denies taking any medication today.

## 2015-08-02 NOTE — ED Notes (Signed)
Pt refusing to go to MRI. States she has already been exposed ," to to much radiation" Dr.Wentz made aware.

## 2015-08-02 NOTE — ED Notes (Addendum)
Pt last seen normal at 1200. Pt was altered per co workers. Pt states she just doesn't feel herself. Pt states when she woke up she was dizzy and had pain in her left arm. Pt is alert and ox4. Able to follow commands.

## 2015-08-02 NOTE — ED Notes (Signed)
MD at bedside. Wentz  

## 2015-08-02 NOTE — Consult Note (Signed)
Referring Physician: ED MD    Chief Complaint: code stroke  HPI:                                                                                                                                         CASIA CORTI is an 54 y.o. female who works at [employer]. She was at work today when staff noted she was not acting correctly. EMS was called. On arrival she appeared "spaced out" and complained of nausea. On arrival she stated she did "not feel herself, initially had arm pain then chest pressure/pain which then changed to dizziness.  She currently can follow all commands, is holding her arms up and noting they do not look ar seem right. Patient shows no major focal neuro deficits.  CT head is normal. HE BP is elevated at 174/104 but patient notes she has not been taking her BP medications in a month due to the fact they make her feel weird.   Date last known well: Date: 08/02/2015 Time last known well: Unable to determine tPA Given: No: minimal symptoms Modified Rankin: Rankin Score=0    Past Medical History  Diagnosis Date  . Hypertension   . Edema   . Pain in limb   . Back pain   . Asthma   . MVP (mitral valve prolapse) 12/22/2011  . S/P dilatation of esophageal stricture 12/22/2011  . Positive ANA (antinuclear antibody) 01/29/2015  . Lupus     Past Surgical History  Procedure Laterality Date  . Abdominal hysterectomy    . Cardiac catheterization    . Tonsillectomy    . Spine surgery      Family History  Problem Relation Age of Onset  . Diabetes Mother   . Hypertension Mother   . Heart disease Mother   . Diabetes Father   . Hypertension Father   . COPD Father   . Heart attack Father 51  . Stroke Other   . Coronary artery disease Other    Social History:  reports that she has never smoked. She has never used smokeless tobacco. She reports that she does not drink alcohol or use illicit drugs.  Allergies:  Allergies  Allergen Reactions  . Morphine And Related Other (See  Comments)    hallucinations  . Pantoprazole Sodium Other (See Comments)    blisters  . Prednisone Other (See Comments)    Blisters on tongue    Medications:  No current facility-administered medications for this encounter.   Current Outpatient Prescriptions  Medication Sig Dispense Refill  . amLODipine (NORVASC) 10 MG tablet Take 1 tablet (10 mg total) by mouth daily. 30 tablet 1  . Multiple Vitamin (MULTIVITAMIN WITH MINERALS) TABS tablet Take 1 tablet by mouth daily.    . naproxen (NAPROSYN) 500 MG tablet Take 1 tablet (500 mg total) by mouth 2 (two) times daily. 30 tablet 0  . valsartan (DIOVAN) 160 MG tablet Take 160 mg by mouth daily.  4     ROS:                                                                                                                                       History obtained from the patient  General ROS: negative for - chills, fatigue, fever, night sweats, weight gain or weight loss Psychological ROS: negative for - behavioral disorder, hallucinations, memory difficulties, mood swings or suicidal ideation Ophthalmic ROS: negative for - blurry vision, double vision, eye pain or loss of vision ENT ROS: negative for - epistaxis, nasal discharge, oral lesions, sore throat, tinnitus or vertigo Allergy and Immunology ROS: negative for - hives or itchy/watery eyes Hematological and Lymphatic ROS: negative for - bleeding problems, bruising or swollen lymph nodes Endocrine ROS: negative for - galactorrhea, hair pattern changes, polydipsia/polyuria or temperature intolerance Respiratory ROS: negative for - cough, hemoptysis, shortness of breath or wheezing Cardiovascular ROS: negative for - chest pain, dyspnea on exertion, edema or irregular heartbeat Gastrointestinal ROS: negative for - abdominal pain, diarrhea, hematemesis, nausea/vomiting or  stool incontinence Genito-Urinary ROS: negative for - dysuria, hematuria, incontinence or urinary frequency/urgency Musculoskeletal ROS: negative for - joint swelling or muscular weakness Neurological ROS: as noted in HPI Dermatological ROS: negative for rash and skin lesion changes  Neurologic Examination:                                                                                                      Blood pressure 174/106, pulse 65, temperature 98.2 F (36.8 C), temperature source Oral, resp. rate 18, height 5\' 6"  (1.676 m), weight 99.338 kg (219 lb), SpO2 98 %.  HEENT-  Normocephalic, no lesions, without obvious abnormality.  Normal external eye and conjunctiva.  Normal TM's bilaterally.  Normal auditory canals and external ears. Normal external nose, mucus membranes and septum.  Normal pharynx. Cardiovascular- S1, S2 normal, pulses palpable throughout   Lungs- chest clear, no wheezing, rales, normal symmetric air entry Abdomen- normal findings:  bowel sounds normal Extremities- no edema Lymph-no adenopathy palpable Musculoskeletal-no joint tenderness, deformity or swelling Skin-warm and dry, no hyperpigmentation, vitiligo, or suspicious lesions  Neurological Examination Mental Status: Alert, oriented, thought content appropriate.  Speech fluent without evidence of aphasia.  Able to follow 3 step commands without difficulty. Cranial Nerves: II: Discs flat bilaterally; Visual fields grossly normal, pupils equal, round, reactive to light and accommodation III,IV, VI: ptosis not present, extra-ocular motions intact bilaterally V,VII: smile symmetric, facial light touch sensation normal bilaterally VIII: hearing normal bilaterally IX,X: uvula rises symmetrically XI: bilateral shoulder shrug XII: midline tongue extension Motor: Right : Upper extremity   5/5    Left:     Upper extremity   5/5  Lower extremity   5/5     Lower extremity   5/5 Tone and bulk:normal tone throughout; no  atrophy noted Sensory: Pinprick and light touch intact throughout, bilaterally Deep Tendon Reflexes: 2+ and symmetric throughout Plantars: Right: downgoing   Left: downgoing Cerebellar: normal finger-to-nose and normal heel-to-shin test Gait: not tested due to safety       Lab Results: Basic Metabolic Panel:  Recent Labs Lab 08/02/15 1304  NA 140  K 3.5  CL 101  GLUCOSE 110*  BUN 17  CREATININE 1.00    Liver Function Tests: No results for input(s): AST, ALT, ALKPHOS, BILITOT, PROT, ALBUMIN in the last 168 hours. No results for input(s): LIPASE, AMYLASE in the last 168 hours. No results for input(s): AMMONIA in the last 168 hours.  CBC:  Recent Labs Lab 08/02/15 1304  HGB 13.9  HCT 41.0    Cardiac Enzymes: No results for input(s): CKTOTAL, CKMB, CKMBINDEX, TROPONINI in the last 168 hours.  Lipid Panel: No results for input(s): CHOL, TRIG, HDL, CHOLHDL, VLDL, LDLCALC in the last 168 hours.  CBG: No results for input(s): GLUCAP in the last 168 hours.  Microbiology: Results for orders placed or performed during the hospital encounter of 08/18/11  Surgical pcr screen     Status: None   Collection Time: 08/18/11  4:13 PM  Result Value Ref Range Status   MRSA, PCR NEGATIVE NEGATIVE Final   Staphylococcus aureus NEGATIVE NEGATIVE Final    Comment:        The Xpert SA Assay (FDA approved for NASAL specimens only), is one component of a comprehensive surveillance program.  It is not intended to diagnose infection nor to guide or monitor treatment.    Coagulation Studies: No results for input(s): LABPROT, INR in the last 72 hours.  Imaging: Ct Head Wo Contrast  08/02/2015   CLINICAL DATA:  54 year old hypertensive female with confusion. Lupus. Subsequent encounter.  EXAM: CT HEAD WITHOUT CONTRAST  TECHNIQUE: Contiguous axial images were obtained from the base of the skull through the vertex without intravenous contrast.  COMPARISON:  06/30/2015 head CT.   02/25/2015 brain MR.  FINDINGS: No intracranial hemorrhage.  Mild hypodensity periatrial region similar to prior head CT may be related to result of small vessel disease rather than posterior reversible encephalopathy syndrome. No CT evidence of large acute infarct.  No intracranial mass lesion noted on this unenhanced exam.  No hydrocephalus.  Mastoid air cells, middle ear cavities and visualized paranasal sinuses are clear.  Orbital structures unremarkable.  No calvarial abnormality.  IMPRESSION: No intracranial hemorrhage.  Mild hypodensity periatrial region similar to prior head CT may be related to result of small vessel disease rather than posterior reversible encephalopathy syndrome. No CT evidence of large acute infarct.  Call is  into Dr. Leonel Ramsay.   Electronically Signed   By: Genia Del M.D.   On: 08/02/2015 13:20       Assessment and plan discussed with with attending physician and they are in agreement.    Etta Quill PA-C [facility] [phone]  08/02/2015, 1:23 PM   Assessment: 54 y.o. female with mulitple complaints including arm numbness, something just "not being right" , chest pain, dizziness. It is unclear how to tie all of her complaints together at this time, but possibilities would include TIA, MI, panic attack, seizure, PRES, conversion disorder.   Stroke Risk Factors - hypertension  1) MRI/MRA brain  2) further recommendations following imaging.   Roland Rack, MD [facility] [phone]  If 7pm- 7am, please page neurology on call as listed in [place].

## 2015-08-03 ENCOUNTER — Ambulatory Visit (HOSPITAL_COMMUNITY): Payer: BLUE CROSS/BLUE SHIELD

## 2015-08-03 ENCOUNTER — Inpatient Hospital Stay (HOSPITAL_COMMUNITY): Payer: BLUE CROSS/BLUE SHIELD

## 2015-08-03 DIAGNOSIS — G459 Transient cerebral ischemic attack, unspecified: Secondary | ICD-10-CM | POA: Diagnosis not present

## 2015-08-03 DIAGNOSIS — R0789 Other chest pain: Secondary | ICD-10-CM | POA: Diagnosis not present

## 2015-08-03 DIAGNOSIS — M62838 Other muscle spasm: Secondary | ICD-10-CM | POA: Diagnosis present

## 2015-08-03 DIAGNOSIS — Z9114 Patient's other noncompliance with medication regimen: Secondary | ICD-10-CM | POA: Diagnosis not present

## 2015-08-03 DIAGNOSIS — G458 Other transient cerebral ischemic attacks and related syndromes: Secondary | ICD-10-CM | POA: Diagnosis present

## 2015-08-03 DIAGNOSIS — I10 Essential (primary) hypertension: Secondary | ICD-10-CM | POA: Diagnosis present

## 2015-08-03 DIAGNOSIS — R404 Transient alteration of awareness: Secondary | ICD-10-CM | POA: Diagnosis present

## 2015-08-03 DIAGNOSIS — R42 Dizziness and giddiness: Secondary | ICD-10-CM | POA: Diagnosis not present

## 2015-08-03 DIAGNOSIS — R768 Other specified abnormal immunological findings in serum: Secondary | ICD-10-CM | POA: Diagnosis not present

## 2015-08-03 LAB — LIPID PANEL
Cholesterol: 201 mg/dL — ABNORMAL HIGH (ref 0–200)
HDL: 50 mg/dL (ref >40)
LDL CALC: 110 mg/dL — AB (ref 0–99)
TRIGLYCERIDES: 207 mg/dL — AB (ref <150)
Total CHOL/HDL Ratio: 4 RATIO
VLDL: 41 mg/dL — AB (ref 0–40)

## 2015-08-03 LAB — TROPONIN I: Troponin I: <0.03 ng/mL (ref <0.031)

## 2015-08-03 MED ORDER — FUROSEMIDE 20 MG PO TABS: 10.0000 mg | ORAL_TABLET | Freq: Every day | ORAL | Status: DC | PRN

## 2015-08-03 MED ORDER — ENALAPRIL MALEATE 5 MG PO TABS
5.0000 mg | ORAL_TABLET | Freq: Every day | ORAL | Status: DC
  Administered 2015-08-03: 5 mg via ORAL
  Filled 2015-08-03: qty 1

## 2015-08-03 MED ORDER — BACLOFEN 10 MG PO TABS
20.0000 mg | ORAL_TABLET | Freq: Three times a day (TID) | ORAL | Status: DC
  Filled 2015-08-03 (×2): qty 2

## 2015-08-03 MED ORDER — ENALAPRIL MALEATE 5 MG PO TABS
5.0000 mg | ORAL_TABLET | Freq: Once | ORAL | Status: AC
  Administered 2015-08-03: 5 mg via ORAL
  Filled 2015-08-03: qty 1

## 2015-08-03 MED ORDER — BACLOFEN 10 MG PO TABS
10.0000 mg | ORAL_TABLET | Freq: Three times a day (TID) | ORAL | Status: DC
  Administered 2015-08-03 (×3): 10 mg via ORAL
  Filled 2015-08-03 (×3): qty 1

## 2015-08-03 MED ORDER — ENALAPRIL MALEATE 5 MG PO TABS: 10.0000 mg | ORAL_TABLET | Freq: Every day | ORAL | Status: DC

## 2015-08-03 MED ORDER — ENALAPRIL MALEATE 2.5 MG PO TABS: 2.5000 mg | ORAL_TABLET | Freq: Every day | ORAL | Status: DC

## 2015-08-03 NOTE — ED Provider Notes (Signed)
Pt here as code stroke, sxs improving in ED.  MRI without acute abnormalities.  D/w Dr. Leonel Ramsay - recommends admission for TIA work up.  Discussed with patient - she is somnolent after receiving ativan, agrees to admission.    Quintella Reichert, MD 08/03/15 [phone]

## 2015-08-03 NOTE — Discharge Summary (Addendum)
Physician Discharge Summary  [name] ZES:[id number] DOB: [birth year] DOA: 08/02/2015  PCP: [name]., MD  Admit date: 08/02/2015 Discharge date: 08/03/2015  Time spent: 50 minutes  Recommendations for Outpatient Follow-up:  1. Basic metabolic panel in 1-2 weeks  Discharge Condition: Stable   Discharge Diagnoses:  Principal Problem:   TIA (transient ischemic attack) Active Problems:   Hypertension   Atypical chest pain   Hyperlipidemia   MVP (mitral valve prolapse)   S/P dilatation of esophageal stricture   Generalized anxiety disorder   DDD (degenerative disc disease), cervical   Shortness of breath   Dizzy spells   Positive ANA (antinuclear antibody)   Cervical paraspinal muscle spasm   Trapezius muscle spasm   Other specified transient cerebral ischemias   Alteration consciousness   Essential hypertension   Noncompliance with medication regimen   History of present illness:  This is a 54 year old female with history of hypertension and noncompliance with medication, mitral valve prolapse, positive ANA, lupus and chronic back pain. The patient presented with a complaint of feeling dizzy. Coworkers noted that she was "spaced out. She also complained of chest and arm pressure. In the ER she was noted to have a blood pressure of 174/104.  Hospital Course:  Hypertensive urgency -He stopped taking her medications 2 months ago according to the history of present illness-she tells me today that she only stopped them 3 days ago because they were making her "feel bad". She specifically says she stopped them because her legs were becoming swollen. I have discussed the fact that she should not stop medication without discussing with her PCP and she states that her PCP "kept changing the appointment date" which is why she has not been able to go back and see her. She further states that her PCP only put her on one blood pressure medication and when she went to see another  doctor, a second medication was added and she is angry because both were not started together in the beginning. -Noted history of noncompliance in chart -Blood pressure today is 155/96 prior to receiving any medication -She is hesitant to go back on Norvasc and valsartan which she thinks were causing side effects -She states that enalapril has worked well for her in the past and is willing to take this again-I have started her on 10 mg a day-I have asked her to follow up with her PCP in 1- 2 weeks as she will most likely need a second medication added and she will need a basic metabolic panel  "Dizzy" spells -MRI/MRA negative for CVA -Symptoms could've been secondary to uncontrolled blood pressure   Chest pressure -Troponin 3 sets negative -EKG negative for acute coronary syndrome -Chest pressure resolved and again may of been secondary to uncontrolled blood pressure  Diastolic heart failure -Grade 2 diastolic dysfunction noted on 2-D echo-patient explained the necessity to keep her blood pressure well controlled to prevent progression of heart failure -ACE inhibitor prescribed as mentioned above -The patient states that she does take when necessary Lasix for pedal edema which I've advised that she continue to do however, would like to check a metabolic panel intermittently to ensure that she does not need potassium supplements with this  Dyslipidemia -Advised to take a low-cholesterol diet-recheck lipids in 3 months Lipid Panel     Component Value Date/Time   CHOL 201* 08/03/2015 0648   TRIG 207* 08/03/2015 0648   HDL 50 08/03/2015 0648   CHOLHDL 4.0 08/03/2015 0648   VLDL  41* 08/03/2015 0648   LDLCALC 110* 08/03/2015 0648   Chronic neck pain -She's been told in the past by neurosurgery that she needs surgery for this  -Advised her to return to neurosurgery-exam reveals tenderness at the base of the neck-baclofen was prescribed yesterday however she states that it is making her  sleepy and therefore I have discontinued it and advised her to take over-the-counter pain medicine for now  History of ANA/Lupus? - Apparently she was on prednisone but states she stopped this as well due to side effects-continue to follow rheumatology as outpatient-   Procedures:  2-D echo Left ventricle: The cavity size was normal. There was mild focal basal hypertrophy of the septum. Systolic function was normal. The estimated ejection fraction was in the range of 60% to 65%. Wall motion was normal; there were no regional wall motion abnormalities. Features are consistent with a pseudonormal left ventricular filling pattern, with concomitant abnormal relaxation and increased filling pressure (grade 2 diastolic dysfunction). - Mitral valve: No echocardiographic evidence for prolapse. - Tricuspid valve: There was trivial regurgitation. - Pulmonic valve: There was trivial regurgitation.  Carotid duplex -No evidence of ICA stenosis bilaterally, vertebral artery flow is antegrade  Consultations:  none  Discharge Exam: Filed Weights   08/02/15 1316 08/02/15 2228  Weight: 99.338 kg (219 lb) 99.1 kg (218 lb 7.6 oz)   Filed Vitals:   08/03/15 1014  BP: 155/96  Pulse: 63  Temp:   Resp: 18    General: AAO x 3, no distress Cardiovascular: RRR, no murmurs  Respiratory: clear to auscultation bilaterally GI: soft, non-tender, non-distended, bowel sound positive  Discharge Instructions You were cared for by a hospitalist during your hospital stay. If you have any questions about your discharge medications or the care you received while you were in the hospital after you are discharged, you can call the unit and asked to speak with the hospitalist on call if the hospitalist that took care of you is not available. Once you are discharged, your primary care physician will handle any further medical issues. Please note that NO REFILLS for any discharge medications will be  authorized once you are discharged, as it is imperative that you return to your primary care physician (or establish a relationship with a primary care physician if you do not have one) for your aftercare needs so that they can reassess your need for medications and monitor your lab values.      Discharge Instructions    Diet - low sodium heart healthy    Complete by:  As directed      Discharge instructions    Complete by:  As directed   See PCP in 1 wk for BP check.     Increase activity slowly    Complete by:  As directed             Medication List    STOP taking these medications        amLODipine 10 MG tablet  Commonly known as:  NORVASC     amLODipine 5 MG tablet  Commonly known as:  NORVASC     naproxen 500 MG tablet  Commonly known as:  NAPROSYN     valsartan 160 MG tablet  Commonly known as:  DIOVAN      TAKE these medications        aspirin 81 MG chewable tablet  Chew 81 mg by mouth daily.     enalapril 5 MG tablet  Commonly known as:  VASOTEC  Take 2 tablets (10 mg total) by mouth daily.     furosemide 20 MG tablet  Commonly known as:  LASIX  Take 0.5 tablets (10 mg total) by mouth daily as needed.     multivitamin with minerals Tabs tablet  Take 1 tablet by mouth daily.       Allergies  Allergen Reactions  . Morphine And Related Other (See Comments)    hallucinations  . Other Other (See Comments)    Pt states allergic to "any narcotics"  . Pantoprazole Sodium Other (See Comments)    blisters  . Prednisone Other (See Comments)    Blisters on tongue   Follow-up Information    Follow up with [name]., MD.   Specialty:  Internal Medicine   Why:  see PCP in 1 wk and have BP checked and Bmet 1 wk   Contact information:   [address] [place] [address] [phone]        The results of significant diagnostics from this hospitalization (including imaging, microbiology, ancillary and laboratory) are listed  below for reference.    Significant Diagnostic Studies: Ct Head Wo Contrast  08/02/2015   ADDENDUM REPORT: 08/02/2015 13:23 ADDENDUM: These results were called by telephone at the time of interpretation on 08/02/2015 at 1:21 pm to Dr. Leonel Ramsay who verbally acknowledged these results. Electronically Signed   By: Genia Del M.D.   On: 08/02/2015 13:23  08/02/2015   CLINICAL DATA:  54 year old hypertensive female with confusion. Lupus. Subsequent encounter.  EXAM: CT HEAD WITHOUT CONTRAST  TECHNIQUE: Contiguous axial images were obtained from the base of the skull through the vertex without intravenous contrast.  COMPARISON:  06/30/2015 head CT.  02/25/2015 brain MR.  FINDINGS: No intracranial hemorrhage.  Mild hypodensity periatrial region similar to prior head CT may be related to result of small vessel disease rather than posterior reversible encephalopathy syndrome. No CT evidence of large acute infarct.  No intracranial mass lesion noted on this unenhanced exam.  No hydrocephalus.  Mastoid air cells, middle ear cavities and visualized paranasal sinuses are clear.  Orbital structures unremarkable.  No calvarial abnormality.  IMPRESSION: No intracranial hemorrhage.  Mild hypodensity periatrial region similar to prior head CT may be related to result of small vessel disease rather than posterior reversible encephalopathy syndrome. No CT evidence of large acute infarct.  Call is into Dr. Leonel Ramsay.  Electronically Signed: By: Genia Del M.D. On: 08/02/2015 13:20   Mr Angiogram Head Wo Contrast  08/02/2015   CLINICAL DATA:  54 year old hypertensive female with history of lupus presenting with change in mental status. Subsequent encounter.  EXAM: MRI HEAD WITHOUT CONTRAST  MRA HEAD WITHOUT CONTRAST  TECHNIQUE: Multiplanar, multiecho pulse sequences of the brain and surrounding structures were obtained without intravenous contrast. Angiographic images of the head were obtained using MRA technique  without contrast.  COMPARISON:  08/02/2015 head CT.  02/25/2015 brain MR.  FINDINGS: MRI HEAD FINDINGS  No acute infarct.  No evidence of posterior reversible encephalopathy syndrome.  No intracranial hemorrhage.  No hydrocephalus.  No intracranial mass lesion noted on this unenhanced exam.  C3-4 bulge and mild spinal stenosis.  Partially empty non expanded sella felt to be an incidental finding without secondary findings of pseudotumor cerebri.  MRA HEAD FINDINGS  Anterior circulation without medium or large size vessel significant stenosis or occlusion. Small linear artifact extends through the A1 segment of the right anterior cerebral artery.  Right vertebral artery is dominant. Left  vertebral artery is small, narrowed and irregular after takeoff of the left posterior inferior cerebellar artery.  No significant stenosis of the basilar artery.  Nonvisualized right anterior inferior cerebellar artery.  Fetal type contribution to the right posterior cerebral artery.  Artifact versus minimal narrowing P2 segment left posterior cerebral artery.  Slightly bulbous appearance of the basilar tip without aneurysm.  IMPRESSION: MRI HEAD  No acute infarct.  No evidence of posterior reversible encephalopathy syndrome.  No intracranial hemorrhage.  No hydrocephalus.  No intracranial mass lesion noted on this unenhanced exam.  C3-4 bulge and mild spinal stenosis.  MRA HEAD  Anterior circulation without medium or large size vessel significant stenosis or occlusion. Small linear artifact extends through the A1 segment of the right anterior cerebral artery.  Right vertebral artery is dominant.  Left vertebral artery is small, narrowed and irregular after takeoff of the left posterior inferior cerebellar artery.  No significant stenosis of the basilar artery.  Nonvisualized right anterior inferior cerebellar artery.  Artifact versus minimal narrowing P2 segment left posterior cerebral artery.   Electronically Signed   By: Genia Del M.D.   On: 08/02/2015 18:49   Mr Brain Wo Contrast  08/02/2015   CLINICAL DATA:  54 year old hypertensive female with history of lupus presenting with change in mental status. Subsequent encounter.  EXAM: MRI HEAD WITHOUT CONTRAST  MRA HEAD WITHOUT CONTRAST  TECHNIQUE: Multiplanar, multiecho pulse sequences of the brain and surrounding structures were obtained without intravenous contrast. Angiographic images of the head were obtained using MRA technique without contrast.  COMPARISON:  08/02/2015 head CT.  02/25/2015 brain MR.  FINDINGS: MRI HEAD FINDINGS  No acute infarct.  No evidence of posterior reversible encephalopathy syndrome.  No intracranial hemorrhage.  No hydrocephalus.  No intracranial mass lesion noted on this unenhanced exam.  C3-4 bulge and mild spinal stenosis.  Partially empty non expanded sella felt to be an incidental finding without secondary findings of pseudotumor cerebri.  MRA HEAD FINDINGS  Anterior circulation without medium or large size vessel significant stenosis or occlusion. Small linear artifact extends through the A1 segment of the right anterior cerebral artery.  Right vertebral artery is dominant. Left vertebral artery is small, narrowed and irregular after takeoff of the left posterior inferior cerebellar artery.  No significant stenosis of the basilar artery.  Nonvisualized right anterior inferior cerebellar artery.  Fetal type contribution to the right posterior cerebral artery.  Artifact versus minimal narrowing P2 segment left posterior cerebral artery.  Slightly bulbous appearance of the basilar tip without aneurysm.  IMPRESSION: MRI HEAD  No acute infarct.  No evidence of posterior reversible encephalopathy syndrome.  No intracranial hemorrhage.  No hydrocephalus.  No intracranial mass lesion noted on this unenhanced exam.  C3-4 bulge and mild spinal stenosis.  MRA HEAD  Anterior circulation without medium or large size vessel significant stenosis or occlusion.  Small linear artifact extends through the A1 segment of the right anterior cerebral artery.  Right vertebral artery is dominant.  Left vertebral artery is small, narrowed and irregular after takeoff of the left posterior inferior cerebellar artery.  No significant stenosis of the basilar artery.  Nonvisualized right anterior inferior cerebellar artery.  Artifact versus minimal narrowing P2 segment left posterior cerebral artery.   Electronically Signed   By: Genia Del M.D.   On: 08/02/2015 18:49    Microbiology: No results found for this or any previous visit (from the past 240 hour(s)).   Labs: Basic Metabolic Panel:  Recent Labs  Lab 08/02/15 1300 08/02/15 1304  NA 141 140  K 3.5 3.5  CL 104 101  CO2 25  --   GLUCOSE 110* 110*  BUN 12 17  CREATININE 0.84 1.00  CALCIUM 9.5  --    Liver Function Tests:  Recent Labs Lab 08/02/15 1300  AST 23  ALT 16  ALKPHOS 89  BILITOT 0.7  PROT 7.2  ALBUMIN 4.2   No results for input(s): LIPASE, AMYLASE in the last 168 hours. No results for input(s): AMMONIA in the last 168 hours. CBC:  Recent Labs Lab 08/02/15 1300 08/02/15 1304  WBC 5.8  --   NEUTROABS 3.4  --   HGB 12.8 13.9  HCT 38.3 41.0  MCV 90.8  --   PLT 183  --    Cardiac Enzymes:  Recent Labs Lab 08/03/15 0312 08/03/15 0648  TROPONINI <0.03 <0.03   BNP: BNP (last 3 results) No results for input(s): BNP in the last 8760 hours.  ProBNP (last 3 results) No results for input(s): PROBNP in the last 8760 hours.  CBG: No results for input(s): GLUCAP in the last 168 hours.     SignedDebbe Odea, MD [facility] 08/03/2015, 1:38 PM

## 2015-08-03 NOTE — Progress Notes (Signed)
Pt discharging at this time alert, verbal taking all personal belongings. No noted distress. IV discontinued, dry dressing applied. Discharge instructions provided with verbal understanding. Pt is aware of follow up appts to be scheduled. EEG on Monday. Pt to pick up prescription at pharmacy.

## 2015-08-03 NOTE — Evaluation (Signed)
Occupational Therapy Evaluation Patient Details Name: [name] MRN: [id number] DOB: 04/04/1961 Today's Date: 08/03/2015    History of Present Illness Pt is a 54 y/o female with a PMH of HTN, MVP, positive ANA, lupus, chronic back pain, non-compliance with meds. Pt presents with dizziness, arm pain/chest pressure, and AMS. MRI of brain negative for acute infarct. C3-4 bulge and mild spinal stenosis..   Clinical Impression   Pt admitted with above.  She reports tingling, numbness, and pain bil. Hands as well as occasional spasms of rt hand and bicep.  She reports she frequently drops items.  She is able to perform ADLs mod I.  Instructed her in ergonomics for work, and AE.  Recommend OPPT for dizziness - ? Cervicogenic dizziness.  No further OT needed at this time.     Follow Up Recommendations  Other (comment) (OPPT)    Equipment Recommendations  None recommended by OT    Recommendations for Other Services       Precautions / Restrictions Precautions Precautions: Fall Restrictions Weight Bearing Restrictions: No      Mobility Bed Mobility Overal bed mobility: Modified Independent             General bed mobility comments: HOb elevated. No assist required  Transfers Overall transfer level: Modified independent Equipment used: None             General transfer comment: No assist required    Balance Overall balance assessment: Needs assistance Sitting-balance support: Feet supported;No upper extremity supported Sitting balance-Leahy Scale: Fair     Standing balance support: No upper extremity supported;During functional activity Standing balance-Leahy Scale: Poor Standing balance comment: Dynamic                            ADL Overall ADL's : Modified independent                                       General ADL Comments: requires increased time      Vision Additional Comments: Pt reports a "hazy" feeling or  disconnect when she is looking at objets.  She reported "dizziness", lightheadedness with occulomotor testing.     Perception     Praxis      Pertinent Vitals/Pain Pain Assessment: Faces Pain Score: 5  Faces Pain Scale: Hurts little more Pain Location: neck, back, hands/fingers Pain Descriptors / Indicators: Aching;Burning;Constant;Restless;Tingling Pain Intervention(s): Monitored during session     Hand Dominance Right   Extremity/Trunk Assessment Upper Extremity Assessment Upper Extremity Assessment: RUE deficits/detail;LUE deficits/detail RUE Deficits / Details: strength at least 4/5.  She is unable to tolerate MMT due to neck pain/discomfort.  She reports frequently dropping items bil. hands due to tingling/numbess, reports of spasms, and pain  RUE Coordination: decreased fine motor LUE Deficits / Details: strength at least 4/5.  She is unable to tolerate MMT due to neck pain/discomfort.  She reports frequently dropping items bil. hands due to tingling/numbess, reports of spasms, and pain LUE Coordination: decreased fine motor   Lower Extremity Assessment Lower Extremity Assessment: Defer to PT evaluation   Cervical / Trunk Assessment Cervical / Trunk Assessment: Other exceptions Cervical / Trunk Exceptions: significant tightness throughout shoulder, neck and upper back musculature    Communication Communication Communication: No difficulties   Cognition Arousal/Alertness: Awake/alert Behavior During Therapy: Anxious Overall Cognitive Status: Within Functional Limits for tasks  assessed                     General Comments       Exercises       Shoulder Instructions      Home Living Family/patient expects to be discharged to:: Private residence Living Arrangements: Children Available Help at Discharge: Family;Available PRN/intermittently Type of Home: Apartment Home Access: Stairs to enter Entrance Stairs-Number of Steps: Flight Entrance Stairs-Rails:  Right;Left Home Layout: One level     Bathroom Shower/Tub: Teacher, early years/pre: Standard Bathroom Accessibility: Yes   Home Equipment: None          Prior Functioning/Environment Level of Independence: Independent        Comments: Pt reports falls    OT Diagnosis: Generalized weakness;Acute pain   OT Problem List: Decreased strength;Impaired UE functional use;Pain   OT Treatment/Interventions:      OT Goals(Current goals can be found in the care plan section) Acute Rehab OT Goals Patient Stated Goal: Improve dizziness OT Goal Formulation: All assessment and education complete, DC therapy  OT Frequency:     Barriers to D/C:            Co-evaluation              End of Session    Activity Tolerance: Patient limited by pain Patient left: in bed;with call bell/phone within reach;with family/visitor present   Time: 0165-5374 OT Time Calculation (min): 19 min Charges:  OT General Charges $OT Visit: 1 Procedure OT Evaluation $Initial OT Evaluation Tier I: 1 Procedure G-Codes:    Lucille Passy M Aug 08, 2015, 4:11 PM

## 2015-08-03 NOTE — Progress Notes (Signed)
  Echocardiogram 2D Echocardiogram has been performed.  Jennette Dubin 08/03/2015, 8:43 AM

## 2015-08-03 NOTE — Evaluation (Signed)
Speech Language Pathology Evaluation Patient Details Name: [name] MRN: [id number] DOB: 06-Jan-1961 Today's Date: 08/03/2015 Time: 0950-1010 SLP Time Calculation (min) (ACUTE ONLY): 20 min  Problem List:  Patient Active Problem List   Diagnosis Date Noted  . Cervical paraspinal muscle spasm 08/03/2015  . Trapezius muscle spasm 08/03/2015  . Other specified transient cerebral ischemias   . Alteration consciousness   . Essential hypertension   . Noncompliance with medication regimen   . TIA (transient ischemic attack) 08/02/2015  . Leg swelling 01/29/2015  . Positive ANA (antinuclear antibody) 01/29/2015  . Blurred vision 01/01/2015  . Dizzy spells 01/01/2015  . Shortness of breath 11/20/2014  . Viral syndrome 11/14/2013  . DDD (degenerative disc disease), cervical 11/09/2013  . Generalized anxiety disorder 12/27/2012  . 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 Allergic rhinitis 11/27/2012  . Hyperlipidemia 12/22/2011  . MVP (mitral valve prolapse) 12/22/2011  . S/P dilatation of esophageal stricture 12/22/2011  . Atypical chest pain 08/19/2011  . Hypertension    Past Medical History:  Past Medical History  Diagnosis Date  . Hypertension   . Edema   . Pain in limb   . Back pain   . Asthma   . MVP (mitral valve prolapse) 12/22/2011  . S/P dilatation of esophageal stricture 12/22/2011  . Positive ANA (antinuclear antibody) 01/29/2015  . Lupus    Past Surgical History:  Past Surgical History  Procedure Laterality Date  . Abdominal hysterectomy    . Cardiac catheterization    . Tonsillectomy    . Spine surgery     HPI:  [name] is an 54 y.o.BF PMHx Non-Compliance with Medication, HTN, MVP, positive ANA, lupus, chronic back pain. She was admitted to hospital after her coworkers noticed her to be acting strangely, and patient herself stated she didn't feel like herself.    Assessment / Plan / Recommendation Clinical Impression  Patient was assessed for cognitive,  linguisitic and speech function and was found to be within normal limits as per this assessment. Patient did not exhibit any deficits in reasoning, awareness, verbal problem solving, memory, and did not exhibit any word-finding or other language errors, and speech was not dysarthric. Patient appears frustrated that she has not gotten any answers to why she has been having numbness and tingling in hands and feet, cramping and spasms (she questioned restless leg syndrome). She feels that she continues to "go downhill" in the past 6 months.    SLP Assessment  Patient does not need any further Speech Lanaguage Pathology Services    Follow Up Recommendations  None    Frequency and Duration        Pertinent Vitals/Pain Pain Assessment: 0-10 Pain Score: 5  Pain Location: back, hands, feet, legs Pain Descriptors / Indicators: Aching;Tingling;Cramping;Sore Pain Intervention(s): Other (comment) (patient stated that RN had given her some medication for "back spasms")   SLP Goals  Patient/Family Stated Goal: Patient would like to know what is causing her pain and "severe tingling" in hands   SLP Evaluation Prior Functioning  Cognitive/Linguistic Baseline: Within functional limits Vocation: Full time employment (works at [employer])   Cognition  Overall Cognitive Status: Within Functional Limits for tasks assessed Orientation Level: Oriented X4    Comprehension  Auditory Comprehension Overall Auditory Comprehension: Appears within functional limits for tasks assessed    Expression Expression Primary Mode of Expression: Verbal Verbal Expression Overall Verbal Expression: Appears within functional limits for tasks assessed   Oral / Motor Oral Motor/Sensory Function Overall  Oral Motor/Sensory Function: Appears within functional limits for tasks assessed Motor Speech Overall Motor Speech: Appears within functional limits for tasks assessed   GO     Dannial Monarch 08/03/2015,  2:08 PM   Sonia Baller, MA, CCC-SLP 08/03/2015 2:08 PM

## 2015-08-03 NOTE — Progress Notes (Signed)
Subjective: Pt still "dizzy" but much improved.   Reports history of abnormal EEG once when she was 18, but unclear what the finding was. No hx seizures.   Exam: Filed Vitals:   08/03/15 1014  BP: 155/96  Pulse: 63  Temp:   Resp: 18   Gen: In bed, NAD Resp: non-labored breathing, no acute distress Abd: soft, nt  Neuro: MS: awake, alert, interactive and apporopriate.  OV:ANVBT, face symmetric Motor: intact to LT  Impression: 54 yo F with transient alteration of awareness. I am not sure what caused her symptoms, the description would be very atypical for TIA, though a baby asa would not be unreasonable as this is a low risk intervention in this patient with known vascular risk factors. Partial seizure though possible, I do not feel I have enough information to start an AED. Symptoms related to hypertension are also possible.   An EEG would not be obtainable until Monday and I do not think that she needs to remain an inpatient for this.   Recommendations: 1) daily baby  2) Advised patient not to drive, states that she already does not drive.  3) F/u with neurology is requested, would perform EEG as outpatient.  4) Please call with any further questions.   Roland Rack, MD [facility] [phone]  If 7pm- 7am, please page neurology on call as listed in [place].

## 2015-08-03 NOTE — Evaluation (Signed)
Physical Therapy Evaluation Patient Details Name: [name] MRN: [id number] DOB: 1961-10-07 Today's Date: 08/03/2015   History of Present Illness  Pt is a 54 y/o female with a PMH of HTN, MVP, positive ANA, lupus, chronic back pain, non-compliance with meds. Pt presents with dizziness, arm pain/chest pressure, and AMS. MRI of brain negative for acute findings.  Clinical Impression  Pt admitted with above diagnosis. Pt currently with functional limitations due to the deficits listed below (see PT Problem List). At the time of PT eval pt was able to perform transfers and ambulation with min assist for balance deficits likely caused by dizziness. Pt with symptoms suspicious for vestibular dysfunction, and recommending Vestibular PT follow-up to assess. Feel pt could benefit from [facility] at d/c as she has had falls in the recent past. Pt will benefit from skilled PT to increase their independence and safety with mobility to allow discharge to the venue listed below.       Follow Up Recommendations Outpatient PT (Vestibular)    Equipment Recommendations  Cane    Recommendations for Other Services       Precautions / Restrictions Precautions Precautions: Fall Restrictions Weight Bearing Restrictions: No      Mobility  Bed Mobility Overal bed mobility: Modified Independent             General bed mobility comments: HOb elevated. No assist required  Transfers Overall transfer level: Modified independent Equipment used: None             General transfer comment: No assist required  Ambulation/Gait Ambulation/Gait assistance: Min assist Ambulation Distance (Feet): 250 Feet Assistive device: None Gait Pattern/deviations: Step-through pattern;Decreased stride length;Staggering left;Staggering right Gait velocity: Decreased Gait velocity interpretation: Below normal speed for age/gender General Gait Details: Occasional min assist required to recover from LOB. Pt complains  of increased dizziness as gait training progresses.   Stairs Stairs:  (Pt declines stair training. )          Wheelchair Mobility    Modified Rankin (Stroke Patients Only)       Balance Overall balance assessment: Needs assistance Sitting-balance support: Feet supported;No upper extremity supported Sitting balance-Leahy Scale: Fair     Standing balance support: No upper extremity supported;During functional activity Standing balance-Leahy Scale: Poor Standing balance comment: Dynamic                             Pertinent Vitals/Pain Pain Assessment: Faces Pain Score: 5  Faces Pain Scale: Hurts little more Pain Location: Back, hands, feet, legs Pain Descriptors / Indicators: Aching;Cramping;Tingling;Pins and needles Pain Intervention(s): Limited activity within patient's tolerance;Monitored during session;Repositioned    Home Living Family/patient expects to be discharged to:: Private residence Living Arrangements: Children Available Help at Discharge: Family;Available PRN/intermittently Type of Home: Apartment Home Access: Stairs to enter Entrance Stairs-Rails: Psychiatric nurse of Steps: Flight Home Layout: One level Home Equipment: None      Prior Function Level of Independence: Independent         Comments: Pt reports falls     Hand Dominance   Dominant Hand: Right    Extremity/Trunk Assessment   Upper Extremity Assessment: Defer to OT evaluation           Lower Extremity Assessment: Generalized weakness (Appeared to be painful with MMT at times but pt denies)      Cervical / Trunk Assessment: Normal  Communication   Communication: No difficulties  Cognition Arousal/Alertness: Awake/alert  Behavior During Therapy: WFL for tasks assessed/performed Overall Cognitive Status: Within Functional Limits for tasks assessed                      General Comments General comments (skin integrity, edema,  etc.): Questionable improvement with symptoms with gaze stabilization during gait training    Exercises        Assessment/Plan    PT Assessment Patient needs continued PT services  PT Diagnosis Difficulty walking;Generalized weakness   PT Problem List Decreased strength;Decreased range of motion;Decreased activity tolerance;Decreased balance;Decreased mobility;Decreased knowledge of use of DME;Decreased safety awareness;Decreased knowledge of precautions;Pain  PT Treatment Interventions DME instruction;Gait training;Stair training;Functional mobility training;Therapeutic activities;Therapeutic exercise;Neuromuscular re-education;Patient/family education   PT Goals (Current goals can be found in the Care Plan section) Acute Rehab PT Goals Patient Stated Goal: Improve dizziness PT Goal Formulation: With patient Time For Goal Achievement: 08/10/15 Potential to Achieve Goals: Good    Frequency Min 3X/week   Barriers to discharge        Co-evaluation               End of Session Equipment Utilized During Treatment: Gait belt Activity Tolerance: Treatment limited secondary to medical complications (Comment) (Dizziness) Patient left: in bed;with call bell/phone within reach;with family/visitor present Nurse Communication: Mobility status         Time: 1415-1446 PT Time Calculation (min) (ACUTE ONLY): 31 min   Charges:   PT Evaluation $Initial PT Evaluation Tier I: 1 Procedure PT Treatments $Gait Training: 8-22 mins   PT G Codes:        Rolinda Roan 08-31-2015, 3:14 PM  Rolinda Roan, PT, DPT Acute Rehabilitation Services Pager: [phone]

## 2015-08-03 NOTE — Progress Notes (Signed)
VASCULAR LAB PRELIMINARY  PRELIMINARY  PRELIMINARY  PRELIMINARY  Carotid duplex completed.    Preliminary report:  No evidence of ICA stenosis bilaterally. Vertebral artery flow is antegrade.  Anadalay Macdonell, RVS 08/03/2015, 1:17 PM

## 2015-08-04 NOTE — Care Management Note (Signed)
Case Management Note  Patient Details  Name: [name] MRN: [id number] Date of Birth: 05-08-1961  Subjective/Objective:                   TIA symptoms Action/Plan: Discharge planning  Expected Discharge Date:  08/03/15               Expected Discharge Plan:  Home/Self Care  In-House Referral:     Discharge planning Services  CM Consult  Post Acute Care Choice:    Choice offered to:     DME Arranged:    DME Agency:     HH Arranged:  PT HH Agency:     Status of Service:  Completed, signed off  Medicare Important Message Given:    Date Medicare IM Given:    Medicare IM give by:    Date Additional Medicare IM Given:    Additional Medicare Important Message give by:     If discussed at [place] of Stay Meetings, dates discussed:    Additional Comments: CM received call from Md to arrange outpt [facility].  CM met with pt and gave pt information for rehab center and pt verbalized understanding she will receive a call from the Rehab center to schedule an appointment.  Pt states she does not want cane.  CM faxed facesheet, telephone order, H&P, DC summary and PT eval to [facility] with request to please call the pt to schedule an appointment.  No other Cm needs were communicated. Dellie Catholic, RN 08/04/2015, 10:37 AM

## 2015-08-05 ENCOUNTER — Ambulatory Visit (HOSPITAL_COMMUNITY): Payer: BLUE CROSS/BLUE SHIELD

## 2015-08-05 ENCOUNTER — Ambulatory Visit (HOSPITAL_COMMUNITY)
Admission: RE | Admit: 2015-08-05 | Discharge: 2015-08-05 | Disposition: A | Discharge status: Home / Self Care | Payer: BLUE CROSS/BLUE SHIELD | Source: Ambulatory Visit | Attending: Neurology | Admitting: Neurology

## 2015-08-05 DIAGNOSIS — R404 Transient alteration of awareness: Secondary | ICD-10-CM | POA: Diagnosis not present

## 2015-08-05 LAB — HEMOGLOBIN A1C
HEMOGLOBIN A1C: 6.2 % — AB (ref 4.8–5.6)
MEAN PLASMA GLUCOSE: 131 mg/dL

## 2015-08-05 NOTE — Progress Notes (Signed)
EEG completed; results pending.    

## 2015-08-05 NOTE — Procedures (Signed)
ELECTROENCEPHALOGRAM REPORT   Patient: [name]       Room #: OP EEG No. ID: [id number] Age: 54 y.o.        Sex: female Referring Physician: Leonel Ramsay Report Date:  08/05/2015        Interpreting Physician: Alexis Goodell  History: [name] is an 54 y.o. female presenting after an episode of unresponsiveness  Medications:  Scheduled:  Conditions of Recording:  This is a 16 channel EEG carried out with the patient in the awake, drowsy and asleep states.  Description:  The waking background activity consists of a low voltage, symmetrical, fairly well organized, 10-11 Hz alpha activity, seen from the parieto-occipital and posterior temporal regions.  Low voltage fast activity, poorly organized, is seen anteriorly and is at times superimposed on more posterior regions.  A mixture of theta and alpha rhythms are seen from the central and temporal regions. The patient drowses with slowing to irregular, low voltage theta and beta activity.   The patient goes in to a light sleep with symmetrical sleep spindles, vertex central sharp transients and irregular slow activity.   No epileptiform activity is noted.   Hyperventilation and intermittent photic stimulation were not performed.   IMPRESSION: Normal electroencephalogram, awake, asleep. There are no focal lateralizing or epileptiform features.   Alexis Goodell, MD [facility] [phone] 08/05/2015, 1:11 PM

## 2015-08-07 ENCOUNTER — Other Ambulatory Visit: Payer: Self-pay

## 2015-08-07 NOTE — Patient Outreach (Signed)
Outreach made to obtain patient consent for [facility] Stroke Transition series. No answer; will continue to attempt to reach.

## 2015-08-08 ENCOUNTER — Ambulatory Visit
Payer: BLUE CROSS/BLUE SHIELD | Attending: Internal Medicine | Admitting: Rehabilitative and Restorative Service Providers"

## 2015-08-08 DIAGNOSIS — R42 Dizziness and giddiness: Secondary | ICD-10-CM

## 2015-08-09 ENCOUNTER — Other Ambulatory Visit: Payer: Self-pay

## 2015-08-09 NOTE — Patient Outreach (Signed)
Outreached to patient and left HIPAA compliant voicemail asking for callback; calling to confirm enrollment in [facility] Stroke Transition program.  Calls will begin tomorrow 08/10/15.

## 2015-08-09 NOTE — Therapy (Signed)
[facility] [address] [place], [place], [address] Phone: [phone]   Fax:  [phone]  Physical Therapy Evaluation  Patient Details  Name: [name] MRN: [id number] Date of Birth: 1961-03-17 Referring Provider:  Debbe Odea, MD  Encounter Date: 08/08/2015      PT End of Session - 08/09/15 0756    Visit Number 1   Number of Visits 1   PT Start Time 0933   PT Stop Time 1015   PT Time Calculation (min) 42 min   Activity Tolerance Patient limited by pain   Behavior During Therapy Restless  hard to get comfortable due to pain      Past Medical History  Diagnosis Date  . Hypertension   . Edema   . Pain in limb   . Back pain   . Asthma   . MVP (mitral valve prolapse) 12/22/2011  . S/P dilatation of esophageal stricture 12/22/2011  . Positive ANA (antinuclear antibody) 01/29/2015  . Lupus     Past Surgical History  Procedure Laterality Date  . Abdominal hysterectomy    . Cardiac catheterization    . Tonsillectomy    . Spine surgery      There were no vitals filed for this visit.  Visit Diagnosis:  Dizziness and giddiness      Subjective Assessment - 08/08/15 0933    Subjective The patient is s/p hospital admission last Friday 08/02/2015 for possible TIA.  She repors that has had increased back pain extending into R leg described as 'sciatica pain".  She also reports neck pain stating she has a bulging disc.  Pt had a recent fall in [place]--unsure what precipitated fall "I don't rmember anything about the fall".  She also noted pain on her right side after the fall.  Last Friday, she experienced an "unresponsive" episode.  She describes a sensation of feeling like she is asleep like it is hard to focus.  "I walk around dizzy every day, so I totally ignore it."  She noted that last week she had chest pain at the same time as dizziness.   She describes numbness in fingertips and a burning in her thumbs  that is constant in nature.    Pertinent History HTN- patient not taking meds regularly until after hospitalization.   Bilateral feet swelling.   Back surgery approximately 3 years ago.       Patient Stated Goals recuce pain in neck and reduce dizziness.   Currently in Pain? Yes   Pain Score 8   stays at 8-10/10 level of pain "sometimes it is over a 10 and I get suicidal thoughts."   Pain Location Neck  t/o spine    Pain Descriptors / Indicators Aching   Pain Type Chronic pain   Pain Radiating Towards right leg   Pain Onset More than a month ago   Pain Frequency Constant   Aggravating Factors  reaching overhead, movement   Pain Relieving Factors alleve     F/U on patient's statement re: suicidal thoughts for pain level 10/10.  Patient answers "NO" to do you have a plan to hurt yourself.  Patient then states that it just crosses her mind when she hurts badly.  PT recommended she discuss pain mgmt options with her primary care physician.       [facility] PT Assessment - 08/08/15 0951    Assessment   Medical Diagnosis TIA, dizziness   Onset Date/Surgical Date 08/02/15  dizzy for over  3 years, now more constant/proiminent   Prior Therapy none   Precautions   Precautions Fall  recent fall in [place]   Balance Screen   Has the patient fallen in the past 6 months Yes   How many times? 1   Has the patient had a decrease in activity level because of a fear of falling?  No   Is the patient reluctant to leave their home because of a fear of falling?  No   Home Ecologist residence   Prior Function   Level of Independence Independent   Teaching laboratory technician, rehabilitation counseling   ROM / Strength   AROM / PROM / Strength AROM;PROM   AROM   Overall AROM Comments Patient guarded with all movements due to significant pain in neck and back.  She requires increased time to move sit<>sidelying, sit<>supine and perform rolling.  She transitions  sit>stand in guarded position.   PROM   Overall PROM Comments in supine, patient tolerates full rotation with pain noted R side with rotation; she c/o decreased pain with gentle soft tissue mobilization and manual traction.             Vestibular Assessment - 08/08/15 0953    Vestibular Assessment   General Observation patient c/o pain, moves slowly when getting up   Symptom Behavior   Type of Dizziness Spinning  confusion, hard to focus   Frequency of Dizziness --  more constant in nature   Duration of Dizziness --  constant at times, varies in intensity   Aggravating Factors Mornings   Relieving Factors No known relieving factors   Occulomotor Exam   Occulomotor Alignment Normal   Spontaneous Absent   Gaze-induced Absent   Smooth Pursuits Intact  has c/o dizziness   Saccades Intact  "bothers me" provokes worsening dizziness   Comment patient has glasses, but does not wear except for watching TV due to makes her dizziness worse   Vestibulo-Occular Reflex   VOR 1 Head Only (x 1 viewing) provokes "pressure" behind eyes and only tolerates 5x   Comment patient c/o grocery store provoking worst symptoms due to head turns and walking  with turns   Positional Testing   Dix-Hallpike --   Sidelying Test Sidelying Right;Sidelying Left   Horizontal Canal Testing Horizontal Canal Right;Horizontal Canal Left   Sidelying Right   Sidelying Right Symptoms No nystagmus   Sidelying Left   Sidelying Left Symptoms No nystagmus   Horizontal Canal Right   Horizontal Canal Right Duration neck pain limits quick movements   Horizontal Canal Right Symptoms Normal   Horizontal Canal Left   Horizontal Canal Left Duration neck pain limits movement   Horizontal Canal Left Symptoms Normal              PT Education - 08/09/15 0754    Education provided Yes   Education Details PT educated patient on current limiting factors of examination including neck and back pain.  Recommended she have  spinal pain addressed prior to receiving PT for vestibular rehab as her limitations in movement will hinder progress.   Person(s) Educated Patient   Methods Explanation   Comprehension Verbalized understanding                Plan - 08/09/15 0757    Clinical Impression Statement The patient is a 54 yo female referred today s/p admission last week for worsening dizziness and "unresponsive" episode.  The patient today describes some  aspects of dizziness that are not consistent with vestibular deficit (constant dizziness unassociated to movement), and other subjective reports that could indicate peripheral vestibular involvement (inclcuding symptoms worse first thing in the morning and in grocery store).  During today's evaluation, the patient's primary complaint is that of 10/10 pain.  Her evaluation for vertigo was limited due to immobility and pain with all movement.  I recommend she first complete PT for pain (as she has never had PT per report) and mobility and then re-referred for vertigo if it is still an ongoing issue.   PT Next Visit Plan n/a-one time evaluation   Recommended Other Services PT for pain (neck and back)   Consulted and Agree with Plan of Care Patient         Problem List Patient Active Problem List   Diagnosis Date Noted  . Altered awareness, transient   . Cervical paraspinal muscle spasm 08/03/2015  . Trapezius muscle spasm 08/03/2015  . Other specified transient cerebral ischemias   . Alteration consciousness   . Essential hypertension   . Noncompliance with medication regimen   . TIA (transient ischemic attack) 08/02/2015  . Leg swelling 01/29/2015  . Positive ANA (antinuclear antibody) 01/29/2015  . Blurred vision 01/01/2015  . Dizzy spells 01/01/2015  . Shortness of breath 11/20/2014  . Viral syndrome 11/14/2013  . DDD (degenerative disc disease), cervical 11/09/2013  . Generalized anxiety disorder 12/27/2012  . 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  Allergic rhinitis 11/27/2012  . Hyperlipidemia 12/22/2011  . MVP (mitral valve prolapse) 12/22/2011  . S/P dilatation of esophageal stricture 12/22/2011  . Atypical chest pain 08/19/2011  . Hypertension    Thank you for the referral of this patient. Rudell Cobb, MPT  [place], PT 08/09/2015, 12:38 PM  [facility] [address] [place], [place], [address] Phone: [phone]   Fax:  [phone]

## 2015-08-29 ENCOUNTER — Encounter: Payer: Self-pay | Admitting: Neurology

## 2015-08-29 ENCOUNTER — Ambulatory Visit (INDEPENDENT_AMBULATORY_CARE_PROVIDER_SITE_OTHER): Payer: BLUE CROSS/BLUE SHIELD | Admitting: Neurology

## 2015-08-29 VITALS — BP 148/95 | HR 81 | Temp 98.2°F | Ht 66.0 in | Wt 217.8 lb

## 2015-08-29 DIAGNOSIS — R0683 Snoring: Secondary | ICD-10-CM

## 2015-08-29 DIAGNOSIS — G4710 Hypersomnia, unspecified: Secondary | ICD-10-CM | POA: Diagnosis not present

## 2015-08-29 DIAGNOSIS — R569 Unspecified convulsions: Secondary | ICD-10-CM

## 2015-08-29 DIAGNOSIS — R635 Abnormal weight gain: Secondary | ICD-10-CM | POA: Diagnosis not present

## 2015-08-29 DIAGNOSIS — R51 Headache: Secondary | ICD-10-CM

## 2015-08-29 DIAGNOSIS — E669 Obesity, unspecified: Secondary | ICD-10-CM

## 2015-08-29 DIAGNOSIS — R519 Headache, unspecified: Secondary | ICD-10-CM

## 2015-08-29 DIAGNOSIS — R202 Paresthesia of skin: Secondary | ICD-10-CM | POA: Diagnosis not present

## 2015-08-29 DIAGNOSIS — R400 Somnolence: Secondary | ICD-10-CM | POA: Insufficient documentation

## 2015-08-29 NOTE — Patient Instructions (Signed)
Remember to drink plenty of fluid, eat healthy meals and do not skip any meals. Try to eat protein with a every meal and eat a healthy snack such as fruit or nuts in between meals. Try to keep a regular sleep-wake schedule and try to exercise daily, particularly in the form of walking, 20-30 minutes a day, if you can.   As far as diagnostic testing: 3-day EEG, emg/ncs, sleep evaluation  I would like to see you back in for emg/ncs and sleep evaluation, sooner if we need to. Please call us with any interim questions, concerns, problems, updates or refill requests.   Please also call us for any test results so we can go over those with you on the phone.  My clinical assistant and will answer any of your questions and relay your messages to me and also relay most of my messages to you.   Our phone number is [phone]. We also have an after hours call service for urgent matters and there is a physician on-call for urgent questions. For any emergencies you know to call 911 or go to the nearest emergency room

## 2015-08-29 NOTE — Progress Notes (Signed)
[facility]    Provider:  Dr Jaynee Eagles Referring Provider: Willey Blade, MD Primary Care Physician:  [name], MD  CC:  Altered awareness and other multiple neurologic complaints  HPI:  [name] is a 54 y.o. female here as a referral from Dr. Karlton Lemon for altered awareness. 3 years ago she was having some sciatica in her right leg. She was active, walking, lifting weight. She had some shots in her belly for cosmetic fat in the stomach. After that she went to the back doctor and had lumbar surgery and after that she didn't have physical therapy and her leg started hurting more. Around December 3 years ago she started having swelling in the abdomen and had an endoscopy. She was in the heart unit for 4-5 days due to severe body pain, chest pain , numbness in the arm and she was extensively worked up. She was on Morphine and still in pain. She was told the pain was coming from her back. She is being managed by Dr. Christella Noa. She has been in pain since then, some days are worse and some days are better. She has suffered every day with pain. Left foot has been swollen on the left and they can't figure it out. About 2 years ago started passing out. Still had swollen foot. She had extensive cardiac evaluation. She followed up with a rheumatologist. The first time she passed out was around that time,2 years ago. She remembers a headache, dizziness, not feeling well and then doesn't remember about it. In the last 2 years has happened 5 times. One lady said she was jerking like she was having a seizure, she stares, she doesn't recognize anyone, staring. She remembers one 3 months where she had a headache, she felt heaviness in her chest, she couldn't talk, she remembers looking at her son but everything was like a daydream, she could see people but couldn't respond, she had numbness and wanted to talk but couldn't talk, everyone looked different. She just remembers that part of it,  she doesn't remember everything. She can do something today but at the end of the day she doesn't remember. She had an EEG when she was younger which was abnormal. She has passing out spells as a teenager and they were attributed to stress and anxiety. When she passes out the spells last 1-2 hours before she feels normal, unsure how long the convulsion lasts she doesn't really know. No urination, no defectaion or biting her tongue. She is excessively tired, she has gained weight, she snores, she wakes up with headaches and a puffy face. She has tingling in the thumb, she drops things, weakness in the hands.   Reviewed notes, labs and imaging from outside physicians, which showed: She was hospitalized beginning of October for hypertensive urgency due to noncompliance of medication. Neurology notes stated that she was at work when noted she was not acting correctly and she appearedout and complained of nausea, neurologic exam was unremarkable, CT of the head was normal, her blood pressure was elevated at 174/104 and she had not been taking her blood pressure medicationsShe also reported having dizzy spells and MRI/MRA was unremarkable, symptoms thought to be secondary to uncontrolled blood pressure.   EG in October 20 16th was normal awake and asleep. Echo in October 20 16th showed an ejection fraction of 60-65%, no evidence for mitral prolapse, trivial tricuspid and pulmonic valve regurgitation, grade 2 diastolic dysfunction. Carotid duplex no  evidence of ICA stenosis bilaterally, vertebral  artery flow is antegrade.   MRI HEAD (personally reviewed images and agree with the following)  No acute infarct.  No evidence of posterior reversible encephalopathy syndrome.  No intracranial hemorrhage.  No hydrocephalus.  No intracranial mass lesion noted on this unenhanced exam.  C3-4 bulge and mild spinal stenosis.  MRA HEAD  Anterior circulation without medium or large size vessel  significantstenosis or occlusion. Small linear artifact extends through the A1 segment of the right anterior cerebral artery.  Right vertebral artery is dominant.  Left vertebral artery is small, narrowed and irregular after takeoff of the left posterior inferior cerebellar artery.  No significant stenosis of the basilar artery.  Nonvisualized right anterior inferior cerebellar artery.  Artifact versus minimal narrowing P2 segment left posterior cerebral artery.  Review of Systems: Patient complains of symptoms per HPI as well as the following symptoms: Weight gain, fatigue, blurred vision, shortness of breath, murmur, wheezing, swelling in legs, snoring, flushing, hearing loss, ringing in ears, trouble swallowing, constipation, joint pain, joint swelling, cramps, aching muscles, rash, itching, moles, skin sensitivity, memory loss, confusion, numbness, weakness, slurred speech, difficulty swallowing, dizziness, passing out, insomnia, sleepiness, snoring, restless legs, depression, not enough sleep, decreased energy, disinterest in activities, racing thoughts. Pertinent negatives per HPI. All others negative.   Social History   Social History  . Marital Status: Legally Separated    Spouse Name: N/A  . Number of Children: 3  . Years of Education: 18   Occupational History  . Student      counseling   Social History Main Topics  . Smoking status: Never Smoker   . Smokeless tobacco: Never Used  . Alcohol Use: No  . Drug Use: No  . Sexual Activity: Not on file   Other Topics Concern  . Not on file   Social History Narrative   Born and raised in [place], [place]. Currently resides in an apartment with her daughter. No pets. Fun: singing/vocalist;   Denies religious beliefs that would effect health care.       Caffeine use: 1 cup every morning    Family History  Problem Relation Age of Onset  . Diabetes Mother   . Hypertension Mother   . Heart disease Mother   . Diabetes  Father   . Hypertension Father   . COPD Father   . Heart attack Father 64  . Stroke Other   . Coronary artery disease Other     Past Medical History  Diagnosis Date  . Hypertension   . Edema   . Pain in limb   . Back pain   . Asthma   . MVP (mitral valve prolapse) 12/22/2011  . S/P dilatation of esophageal stricture 12/22/2011  . Positive ANA (antinuclear antibody) 01/29/2015  . Lupus [facility])     Past Surgical History  Procedure Laterality Date  . Abdominal hysterectomy    . Cardiac catheterization    . Tonsillectomy    . Spine surgery      Current Outpatient Prescriptions  Medication Sig Dispense Refill  . aspirin 81 MG chewable tablet Chew 81 mg by mouth daily.    . cyclobenzaprine (FLEXERIL) 10 MG tablet Take 10 mg by mouth 3 (three) times daily as needed for muscle spasms.    . Multiple Vitamin (MULTIVITAMIN WITH MINERALS) TABS tablet Take 1 tablet by mouth daily.    . valsartan (DIOVAN) 160 MG tablet Take 160 mg by mouth daily.  4  . furosemide (LASIX) 20 MG tablet Take 0.5  tablets (10 mg total) by mouth daily as needed. (Patient not taking: Reported on 08/29/2015) 30 tablet    No current facility-administered medications for this visit.    Allergies as of 08/29/2015 - Review Complete 08/29/2015  Allergen Reaction Noted  . Morphine and related Other (See Comments)   . Other Other (See Comments) 08/02/2015  . Pantoprazole sodium Other (See Comments) 08/19/2011  . Prednisone Other (See Comments) 08/19/2011    Vitals: BP 148/95 mmHg  Pulse 81  Temp(Src) 98.2 F (36.8 C) (Oral)  Ht 5\' 6"  (1.676 m)  Wt 217 lb 12.8 oz (98.793 kg)  BMI 35.17 kg/m2 Last Weight:  Wt Readings from Last 1 Encounters:  08/29/15 217 lb 12.8 oz (98.793 kg)   Last Height:   Ht Readings from Last 1 Encounters:  08/29/15 5\' 6"  (1.676 m)   Physical exam: Exam: Gen: NAD, conversant, well nourised, obese, well groomed                     CV: RRR, no MRG. No Carotid Bruits. No peripheral  edema, warm, nontender Eyes: Conjunctivae clear without exudates or hemorrhage  Neuro: Detailed Neurologic Exam  Speech:    Speech is normal; fluent and spontaneous with normal comprehension.  Cognition:    The patient is oriented to person, place, and time;     recent and remote memory intact;     language fluent;     normal attention, concentration,     fund of knowledge Cranial Nerves:    The pupils are equal, round, and reactive to light. The fundi are flat. Visual fields are full to finger confrontation. Extraocular movements are intact. Trigeminal sensation is intact and the muscles of mastication are normal. The face is symmetric. The palate elevates in the midline. Hearing intact. Voice is normal. Shoulder shrug is normal. The tongue has normal motion without fasciculations.   Coordination:    Normal finger to nose and heel to shin.   Gait:    Heel-toe and tandem gait are normal.   Motor Observation:    No asymmetry, no atrophy, and no involuntary movements noted. Tone:    Normal muscle tone.    Posture:    Posture is normal. normal erect    Strength:    Strength is V/V in the upper and lower limbs.      Sensation: intact to LT     Reflex Exam:  DTR's:    Deep tendon reflexes in the upper and lower extremities are normal bilaterally.   Toes:    The toes are downgoing bilaterally.   Clonus:    Clonus is absent.       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 She reports 5 of these episodes in the last 2 years. She can't give me an exact description of what happens, sometimes she does not remember it but has been told that she convulsed, another time she does remember it and remembers people talking to her like she was in a daydream but she couldn't answer. She was hospitalized recently and Dxed with hypertensive urgency  and MRI/MRA, echo, carotids, ekg performed. She has other multiple neurologic complaints including sciatica and tingling in her fingers and all over body pain. She describes headaches nocturnally as well as snoring and excessive daytime fatigue..  Emg/ncs all 4 limbs: evaluate for median/ulnar neuropathy,  polyneuropathy, radiculopathy 3-day eeg: ambulatory, video for episodes of alteration of consciousness. If negative, do not recommend anti-epileptics Sleep study : Patient reports excessive daytime somnolence, snoring, nocturnal headaches and morning headaches, weight gain and obesity. Evaluate for OSA and hypoventilation obesity syndrome  Needs continued f/u with rheumatology and NSY.   CC: Dr. Charlcie Cradle, MD  [facility] [address] [place], [place] [address]  Phone [phone] Fax [phone]

## 2015-08-30 ENCOUNTER — Encounter: Payer: Self-pay | Admitting: *Deleted

## 2015-08-30 NOTE — Progress Notes (Signed)
Tried faxing order for 72-hr AMB EEG to [facility] at [phone]. Received failed fax x3. Tried calling phone number, but phone just kept ringing. Will try to fax again on Monday.

## 2015-09-02 ENCOUNTER — Encounter: Payer: Self-pay | Admitting: *Deleted

## 2015-09-02 NOTE — Progress Notes (Signed)
Faxed order for 72-hr AMB EEG to [facility] at [phone]. Received fax confirmation.

## 2015-09-03 ENCOUNTER — Encounter: Payer: Self-pay | Admitting: *Deleted

## 2015-09-03 NOTE — Progress Notes (Signed)
Received referral status notification from [facility] that referral was received for AMB EEG and once pt scheduled, they will notify our office.

## 2015-09-11 ENCOUNTER — Encounter: Payer: Self-pay | Admitting: *Deleted

## 2015-09-11 NOTE — Progress Notes (Signed)
Received fax from [facility] that pt is scheduled 10/03/15-10/06/15 for 72-hr AMB EEG.

## 2015-09-17 ENCOUNTER — Ambulatory Visit (INDEPENDENT_AMBULATORY_CARE_PROVIDER_SITE_OTHER): Payer: BLUE CROSS/BLUE SHIELD | Admitting: Neurology

## 2015-09-17 ENCOUNTER — Encounter: Payer: Self-pay | Admitting: Neurology

## 2015-09-17 VITALS — BP 162/98 | HR 70 | Resp 18 | Ht 67.0 in | Wt 213.0 lb

## 2015-09-17 DIAGNOSIS — G2581 Restless legs syndrome: Secondary | ICD-10-CM

## 2015-09-17 DIAGNOSIS — E669 Obesity, unspecified: Secondary | ICD-10-CM

## 2015-09-17 DIAGNOSIS — G4761 Periodic limb movement disorder: Secondary | ICD-10-CM

## 2015-09-17 DIAGNOSIS — R519 Headache, unspecified: Secondary | ICD-10-CM

## 2015-09-17 DIAGNOSIS — R51 Headache: Secondary | ICD-10-CM | POA: Diagnosis not present

## 2015-09-17 DIAGNOSIS — R351 Nocturia: Secondary | ICD-10-CM

## 2015-09-17 DIAGNOSIS — G4719 Other hypersomnia: Secondary | ICD-10-CM

## 2015-09-17 DIAGNOSIS — R0683 Snoring: Secondary | ICD-10-CM

## 2015-09-17 NOTE — Patient Instructions (Signed)

## 2015-09-17 NOTE — Progress Notes (Signed)
Subjective:    Patient ID: [name] is a 54 y.o. female.  HPI     Star Age, MD, PhD [facility] [address], [address]O. [place], [place] [address]  Dear Berta Minor,   I saw your patient, [name], upon your kind request in my clinic today for initial consultation of her sleep disorder, in particular, concern for underlying obstructive sleep apnea. The patient is unaccompanied today. As you know, [name] is a 54 year old right-handed woman with an underlying medical history of hypertension, edema, back pain, asthma, mitral valve prolapse, positive ANA, status post spine surgery, tonsillectomy, hysterectomy and cardiac catheterization, and obesity, who reports snoring, excessive daytime somnolence, and morning headaches. I reviewed your office note from 08/29/2015.  She has her 91 yo daughter live with her currently. She snores loudly and has made choking sounds while asleep per daughter's report and she has woken herself up with a sense of gasping. 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 She does not smoke or drink alcohol, no drugs, no sodas, but has 1 cup of coffee per day in AM. Her BT is around 9 PM and she has nocturia 2-3 times per night. She has nocturnal and AM headaches occasionally and has some RLS symptoms and moves her feet a lot in bed. She is not aware of any FHx of OSA or RLS. She has a rise time of 6 AM. She does not wake up rested. She wants to go back to sleep. She does not typically nap. She had a tonsillectomy as a 54 year old. Her ESS is 17/24, her FSS is 62/63.   Her Past Medical History Is Significant For: Past Medical History  Diagnosis Date  . Hypertension   . Edema   . Pain in limb   . Back pain   . Asthma   . MVP (mitral valve prolapse) 12/22/2011  . S/P dilatation of esophageal stricture 12/22/2011  . Positive ANA (antinuclear antibody) 01/29/2015  . Lupus ([facility])     Her Past  Surgical History Is Significant For: Past Surgical History  Procedure Laterality Date  . Abdominal hysterectomy    . Cardiac catheterization    . Tonsillectomy    . Spine surgery      Her Family History Is Significant For: Family History  Problem Relation Age of Onset  . Diabetes Mother   . Hypertension Mother   . Heart disease Mother   . Diabetes Father   . Hypertension Father   . COPD Father   . Heart attack Father 63  . Stroke Other   . Coronary artery disease Other     Her Social History Is Significant For: Social History   Social History  . Marital Status: Legally Separated    Spouse Name: N/A  . Number of Children: 3  . Years of Education: 18   Occupational History  . Student      counseling   Social History Main Topics  . Smoking status: Never Smoker   . Smokeless tobacco: Never Used  . Alcohol Use: No  . Drug Use: No  . Sexual Activity: Not Asked   Other Topics Concern  . None   Social History Narrative   Born and raised in [place], [place]. Currently resides in an apartment with her daughter. No pets. Fun: singing/vocalist;   Denies religious beliefs that would effect health care.       Caffeine use: 1 cup  every morning    Her Allergies Are:  Allergies  Allergen Reactions  . Morphine And Related Other (See Comments)    hallucinations  . Other Other (See Comments)    Pt states allergic to "any narcotics"  . Pantoprazole Sodium Other (See Comments)    blisters  . Prednisone Other (See Comments)    Blisters on tongue  :   Her Current Medications Are:  Outpatient Encounter Prescriptions as of 09/17/2015  Medication Sig  . aspirin 81 MG chewable tablet Chew 81 mg by mouth daily.  . cyclobenzaprine (FLEXERIL) 10 MG tablet Take 10 mg by mouth 3 (three) times daily as needed for muscle spasms.  . furosemide (LASIX) 20 MG tablet Take 0.5 tablets (10 mg total) by mouth daily as needed.  . furosemide (LASIX) 40 MG tablet   . Multiple Vitamin  (MULTIVITAMIN WITH MINERALS) TABS tablet Take 1 tablet by mouth daily.  . [DISCONTINUED] valsartan (DIOVAN) 160 MG tablet Take 160 mg by mouth daily.   No facility-administered encounter medications on file as of 09/17/2015.  :  Review of Systems:  Out of a complete 14 point review of systems, all are reviewed and negative with the exception of these symptoms as listed below:    Review of Systems  Constitutional: Positive for fatigue.       Weight gain   Respiratory: Positive for shortness of breath and wheezing.   Musculoskeletal:       Joint pain and swelling   Neurological: Positive for dizziness, syncope, weakness and numbness.       Memory loss, Trouble falling and staying asleep, falls asleep after eating or sitting still, restless legs, snoring, wakes up choking, wakes up feeling tired, daytime tiredness, denies taking naps, if takes a nap she wakes up confused.   Psychiatric/Behavioral: Positive for confusion.       Not enough sleep, decreased energy, disinterest in activities  Epworth Sleepiness Scale 0= would never doze 1= slight chance of dozing 2= moderate chance of dozing 3= high chance of dozing  Sitting and reading:3 Watching TV:3 Sitting inactive in a public place (ex. Theater or meeting):3 As a passenger in a car for an hour without a break:1 Lying down to rest in the afternoon:2 Sitting and talking to someone:2 Sitting quietly after lunch (no alcohol):3 In a car, while stopped in traffic:0 Total:17  Objective:  Neurologic Exam  Physical Exam Physical Examination:   Filed Vitals:   09/17/15 1041  BP: 162/98  Pulse: 70  Resp: 18   General Examination: The patient is a very pleasant 54 y.o. female in no acute distress. She appears well-developed and well-nourished and well groomed. She is obese.  HEENT: Normocephalic, atraumatic, pupils are equal, round and reactive to light and accommodation. Extraocular tracking is good without limitation to gaze  excursion or nystagmus noted. Normal smooth pursuit is noted. Hearing is grossly intact. Face is symmetric with normal facial animation and normal facial sensation. Speech is clear with no dysarthria noted. There is no hypophonia. There is no lip, neck/head, jaw or voice tremor. Neck is supple with full range of passive and active motion. There are no carotid bruits on auscultation. Oropharynx exam reveals: mild mouth dryness, good dental hygiene and mild airway crowding, due to larger tongue and redundant soft palate. Mallampati is class I. Tongue protrudes centrally and palate elevates symmetrically. Tonsils are absent. Neck size is 14 inches. She has a Mild overbite. Nasal inspection reveals no significant nasal mucosal bogginess or redness and  no septal deviation.   Chest: Clear to auscultation without wheezing, rhonchi or crackles noted.  Heart: S1+S2+0, regular and normal without murmurs, rubs or gallops noted.   Abdomen: Soft, non-tender and non-distended with normal bowel sounds appreciated on auscultation.  Extremities: There is trace pitting edema in the distal lower extremities bilaterally. Pedal pulses are intact.  Skin: Warm and dry without trophic changes noted. There are no varicose veins.  Musculoskeletal: exam reveals no obvious joint deformities, tenderness or joint swelling or erythema.   Neurologically:  Mental status: The patient is awake, alert and oriented in all 4 spheres. Her immediate and remote memory, attention, language skills and fund of knowledge are appropriate. There is no evidence of aphasia, agnosia, apraxia or anomia. Speech is clear with normal prosody and enunciation. Thought process is linear. Mood is normal and affect is normal.  Cranial nerves II - XII are as described above under HEENT exam. In addition: shoulder shrug is normal with equal shoulder height noted. Motor exam: Normal bulk, strength and tone is noted. There is no drift, tremor or rebound.  Romberg is negative. Reflexes are 2+ throughout. Babinski: Toes are flexor bilaterally. Fine motor skills and coordination: intact with normal finger taps, normal hand movements, normal rapid alternating patting, normal foot taps and normal foot agility.  Cerebellar testing: No dysmetria or intention tremor on finger to nose testing. Heel to shin is unremarkable bilaterally. There is no truncal or gait ataxia.  Sensory exam: intact to light touch in the upper and lower extremities.  Gait, station and balance: She stands easily. No veering to one side is noted. No leaning to one side is noted. Posture is age-appropriate and stance is narrow based. Gait shows normal stride length and normal pace. No problems turning are noted. She turns en bloc. Tandem walk is unremarkable.   Assessment and Plan:   In summary, [name] is a very pleasant 54 y.o.-year old female with an underlying medical history of hypertension, edema, back pain, asthma, mitral valve prolapse, positive ANA, status post spine surgery, tonsillectomy, hysterectomy and cardiac catheterization, and obesity, whose history and physical exam are indeed concerning for obstructive sleep apnea (OSA). I had a long chat with the patient about my findings and the diagnosis of OSA, its prognosis and treatment options. We talked about medical treatments, surgical interventions and non-pharmacological approaches. I explained in particular the risks and ramifications of untreated moderate to severe OSA, especially with respect to developing cardiovascular disease down the Road, including congestive heart failure, difficult to treat hypertension, cardiac arrhythmias, or stroke. Even type 2 diabetes has, in part, been linked to untreated OSA. Symptoms of untreated OSA include daytime sleepiness, memory problems, mood irritability and mood disorder such as depression and anxiety, lack of energy, as well as recurrent headaches, especially morning headaches. We  talked about trying to maintain a healthy lifestyle in general, as well as the importance of weight control. I encouraged the patient to eat healthy, exercise daily and keep well hydrated, to keep a scheduled bedtime and wake time routine, to not skip any meals and eat healthy snacks in between meals. I advised the patient not to drive when feeling sleepy. I recommended the following at this time: sleep study with potential positive airway pressure titration. (We will score hypopneas at 3% and split the sleep study into diagnostic and treatment portion, if the estimated. 2 hour AHI is >15/h).   I explained the sleep test procedure to the patient and also outlined possible  surgical and non-surgical treatment options of OSA, including the use of a custom-made dental device (which would require a referral to a specialist dentist or oral surgeon), upper airway surgical options, such as pillar implants, radiofrequency surgery, tongue base surgery, and UPPP (which would involve a referral to an ENT surgeon). Rarely, jaw surgery such as mandibular advancement may be considered.  I also explained the CPAP treatment option to the patient, who indicated that she would be willing to try CPAP if the need arises. I explained the importance of being compliant with PAP treatment, not only for insurance purposes but primarily to improve Her symptoms, and for the patient's long term health benefit, including to reduce Her cardiovascular risks. I answered all her questions today and the patient was in agreement. I would like to see her back after the sleep study is completed and encouraged her to call with any interim questions, concerns, problems or updates.   Thank you very much for allowing me to participate in the care of this nice patient. If I can be of any further assistance to you please do not hesitate to talk to me.   Sincerely,   Star Age, MD, PhD

## 2015-09-23 ENCOUNTER — Encounter (HOSPITAL_COMMUNITY): Payer: Self-pay | Admitting: Emergency Medicine

## 2015-09-23 ENCOUNTER — Emergency Department (INDEPENDENT_AMBULATORY_CARE_PROVIDER_SITE_OTHER)
Admission: EM | Admit: 2015-09-23 | Discharge: 2015-09-23 | Disposition: A | Discharge status: Home / Self Care | Payer: BLUE CROSS/BLUE SHIELD | Source: Home / Self Care

## 2015-09-23 DIAGNOSIS — I10 Essential (primary) hypertension: Secondary | ICD-10-CM | POA: Diagnosis not present

## 2015-09-23 MED ORDER — HYDROCHLOROTHIAZIDE 25 MG PO TABS: 25.0000 mg | ORAL_TABLET | Freq: Every day | ORAL | Status: DC

## 2015-09-23 MED ORDER — ENALAPRIL MALEATE 20 MG PO TABS: 10.0000 mg | ORAL_TABLET | Freq: Every day | ORAL | Status: DC

## 2015-09-23 NOTE — ED Notes (Signed)
Here for HTN; provider in room w/pt See provider notes

## 2015-09-23 NOTE — Discharge Instructions (Signed)

## 2015-09-24 NOTE — ED Provider Notes (Signed)
CSN: [id number]     Arrival date & time 09/23/15  1846 History   None    Chief Complaint  Patient presents with  . Hypertension   (Consider location/radiation/quality/duration/timing/severity/associated sxs/prior Treatment) HPI Pt is here for hypertension, as she has been out of her medication. Does not wish to take varsartan that her doector has rx as she is having reactions to the meds. States she was on  Enalapril with hctz which was working well for her.  She has no other complaints at this time.  Past Medical History  Diagnosis Date  . Hypertension   . Edema   . Pain in limb   . Back pain   . Asthma   . MVP (mitral valve prolapse) 12/22/2011  . S/P dilatation of esophageal stricture 12/22/2011  . Positive ANA (antinuclear antibody) 01/29/2015  . Lupus [facility])    Past Surgical History  Procedure Laterality Date  . Abdominal hysterectomy    . Cardiac catheterization    . Tonsillectomy    . Spine surgery     Family History  Problem Relation Age of Onset  . Diabetes Mother   . Hypertension Mother   . Heart disease Mother   . Diabetes Father   . Hypertension Father   . COPD Father   . Heart attack Father 96  . Stroke Other   . Coronary artery disease Other    Social History  Substance Use Topics  . Smoking status: Never Smoker   . Smokeless tobacco: Never Used  . Alcohol Use: No   OB History    No data available     Review of Systems ROS +'ve blood pressure  Denies: HEADACHE, NAUSEA, ABDOMINAL PAIN, CHEST PAIN, CONGESTION, DYSURIA, SHORTNESS OF BREATH  Allergies  Morphine and related; Other; Pantoprazole sodium; and Prednisone  Home Medications   Prior to Admission medications   Medication Sig Start Date End Date Taking? Authorizing Provider  aspirin 81 MG chewable tablet Chew 81 mg by mouth daily.   Yes Historical Provider, MD  cyclobenzaprine (FLEXERIL) 10 MG tablet Take 10 mg by mouth 3 (three) times daily as needed for muscle spasms.    Historical  Provider, MD  enalapril (VASOTEC) 20 MG tablet Take 0.5 tablets (10 mg total) by mouth daily. 09/23/15   Konrad Felix, PA  furosemide (LASIX) 20 MG tablet Take 0.5 tablets (10 mg total) by mouth daily as needed. 08/03/15   Debbe Odea, MD  furosemide (LASIX) 40 MG tablet  08/26/15   Historical Provider, MD  hydrochlorothiazide (HYDRODIURIL) 25 MG tablet Take 1 tablet (25 mg total) by mouth daily. 09/23/15   Konrad Felix, PA  Multiple Vitamin (MULTIVITAMIN WITH MINERALS) TABS tablet Take 1 tablet by mouth daily.    Historical Provider, MD   Meds Ordered and Administered this Visit  Medications - No data to display  BP 194/103 mmHg  Pulse 67  Temp(Src) 98 F (36.7 C) (Oral)  Resp 22  SpO2 98% No data found.   Physical Exam NURSES NOTES AND VITAL SIGNS REVIEWED. CONSTITUTIONAL: Well developed, well nourished, no acute distress HEENT: normocephalic, atraumatic EYES: Conjunctiva normal NECK:normal ROM, supple PULMONARY:No respiratory distress, normal effort, Lungs: CTAb/l CARDIOVASCULAR: RRR, no murmur ABDOMEN: soft, ND, NT, +'ve BS MUSCULOSKELETAL: Normal ROM of all extremities SKIN: warm and dry without rash PSYCHIATRIC: Mood and affect normal  ED Course  Procedures (including critical care time)  Labs Review Labs Reviewed - No data to display  Imaging Review No results found.  Visual Acuity Review  Right Eye Distance:   Left Eye Distance:   Bilateral Distance:    Right Eye Near:   Left Eye Near:    Bilateral Near:         MDM  No diagnosis found.  Pt is provided with rx for enalapril and hctz. She is advised to speak with her doctor about the medication reaction. It is better to find a drug that works, than not to be treated at all. Patient is driving and so I have discussed with her that she needs to go home and take her medications. As lowering bp in UC may endanger her LOC to drive home safely.    Konrad Felix, [place] 09/24/15 [phone]

## 2015-09-30 ENCOUNTER — Encounter: Payer: BLUE CROSS/BLUE SHIELD | Admitting: Neurology

## 2015-10-02 ENCOUNTER — Encounter (HOSPITAL_COMMUNITY): Payer: Self-pay

## 2015-10-02 ENCOUNTER — Emergency Department (HOSPITAL_COMMUNITY): Payer: BLUE CROSS/BLUE SHIELD

## 2015-10-02 ENCOUNTER — Emergency Department (HOSPITAL_COMMUNITY)
Admission: EM | Admit: 2015-10-02 | Discharge: 2015-10-02 | Disposition: A | Discharge status: Home / Self Care | Payer: BLUE CROSS/BLUE SHIELD | Attending: Emergency Medicine | Admitting: Emergency Medicine

## 2015-10-02 DIAGNOSIS — M940 Chondrocostal junction syndrome [Tietze]: Secondary | ICD-10-CM

## 2015-10-02 DIAGNOSIS — J45909 Unspecified asthma, uncomplicated: Secondary | ICD-10-CM | POA: Diagnosis not present

## 2015-10-02 DIAGNOSIS — Z79899 Other long term (current) drug therapy: Secondary | ICD-10-CM | POA: Diagnosis not present

## 2015-10-02 DIAGNOSIS — I10 Essential (primary) hypertension: Secondary | ICD-10-CM | POA: Diagnosis not present

## 2015-10-02 DIAGNOSIS — R1013 Epigastric pain: Secondary | ICD-10-CM | POA: Insufficient documentation

## 2015-10-02 DIAGNOSIS — R079 Chest pain, unspecified: Secondary | ICD-10-CM | POA: Diagnosis present

## 2015-10-02 DIAGNOSIS — Z7982 Long term (current) use of aspirin: Secondary | ICD-10-CM | POA: Insufficient documentation

## 2015-10-02 DIAGNOSIS — Z8719 Personal history of other diseases of the digestive system: Secondary | ICD-10-CM | POA: Diagnosis not present

## 2015-10-02 DIAGNOSIS — I5030 Unspecified diastolic (congestive) heart failure: Secondary | ICD-10-CM | POA: Diagnosis not present

## 2015-10-02 HISTORY — DX: Unspecified diastolic (congestive) heart failure: I50.30

## 2015-10-02 LAB — I-STAT TROPONIN, ED: TROPONIN I, POC: 0.01 ng/mL (ref 0.00–0.08)

## 2015-10-02 LAB — CBC
HCT: 37.4 % (ref 36.0–46.0)
HEMOGLOBIN: 12.4 g/dL (ref 12.0–15.0)
MCH: 30.2 pg (ref 26.0–34.0)
MCHC: 33.2 g/dL (ref 30.0–36.0)
MCV: 91.2 fL (ref 78.0–100.0)
PLATELETS: 172 10*3/uL (ref 150–400)
RBC: 4.1 MIL/uL (ref 3.87–5.11)
RDW: 12.6 % (ref 11.5–15.5)
WBC: 5.2 10*3/uL (ref 4.0–10.5)

## 2015-10-02 LAB — BASIC METABOLIC PANEL
ANION GAP: 7 (ref 5–15)
BUN: 11 mg/dL (ref 6–20)
CALCIUM: 9.2 mg/dL (ref 8.9–10.3)
CHLORIDE: 106 mmol/L (ref 101–111)
CO2: 27 mmol/L (ref 22–32)
CREATININE: 0.84 mg/dL (ref 0.44–1.00)
GFR calc non Af Amer: >60 mL/min (ref >60)
Glucose, Bld: 115 mg/dL — ABNORMAL HIGH (ref 65–99)
Potassium: 4 mmol/L (ref 3.5–5.1)
SODIUM: 140 mmol/L (ref 135–145)

## 2015-10-02 LAB — HEPATIC FUNCTION PANEL
ALT: 15 U/L (ref 14–54)
AST: 22 U/L (ref 15–41)
Albumin: 3.9 g/dL (ref 3.5–5.0)
Alkaline Phosphatase: 88 U/L (ref 38–126)
TOTAL PROTEIN: 7 g/dL (ref 6.5–8.1)
Total Bilirubin: 0.4 mg/dL (ref 0.3–1.2)

## 2015-10-02 LAB — LIPASE, BLOOD: LIPASE: 29 U/L (ref 11–51)

## 2015-10-02 LAB — D-DIMER, QUANTITATIVE (NOT AT ARMC): D DIMER QUANT: 0.3 ug{FEU}/mL (ref 0.00–0.50)

## 2015-10-02 MED ORDER — KETOROLAC TROMETHAMINE 30 MG/ML IJ SOLN
30.0000 mg | Freq: Once | INTRAMUSCULAR | Status: AC
  Administered 2015-10-02: 30 mg via INTRAVENOUS
  Filled 2015-10-02: qty 1

## 2015-10-02 MED ORDER — GI COCKTAIL ~~LOC~~
30.0000 mL | Freq: Once | ORAL | Status: AC
  Administered 2015-10-02: 30 mL via ORAL
  Filled 2015-10-02: qty 30

## 2015-10-02 NOTE — Discharge Instructions (Signed)
Chest Wall Pain Chest wall pain is pain in or around the bones and muscles of your chest. Sometimes, an injury causes this pain. Sometimes, the cause may not be known. This pain may take several weeks or longer to get better. HOME CARE INSTRUCTIONS  Pay attention to any changes in your symptoms. Take these actions to help with your pain:   Rest as told by your health care provider.   Avoid activities that cause pain. These include any activities that use your chest muscles or your abdominal and side muscles to lift heavy items.   If directed, apply ice to the painful area:  Put ice in a plastic bag.  Place a towel between your skin and the bag.  Leave the ice on for 20 minutes, 2-3 times per day.  Take over-the-counter and prescription medicines only as told by your health care provider.  Do not use tobacco products, including cigarettes, chewing tobacco, and e-cigarettes. If you need help quitting, ask your health care provider.  Keep all follow-up visits as told by your health care provider. This is important. SEEK MEDICAL CARE IF:  You have a fever.  Your chest pain becomes worse.  You have new symptoms. SEEK IMMEDIATE MEDICAL CARE IF:  You have nausea or vomiting.  You feel sweaty or light-headed.  You have a cough with phlegm (sputum) or you cough up blood.  You develop shortness of breath.   This information is not intended to replace advice given to you by your health care provider. Make sure you discuss any questions you have with your health care provider.   Document Released: 10/19/2005 Document Revised: 07/10/2015 Document Reviewed: 01/14/2015 Elsevier Interactive Patient Education 2016 Elsevier Inc[employer]  Costochondritis Costochondritis, sometimes called Tietze syndrome, is a swelling and irritation (inflammation) of the tissue (cartilage) that connects your ribs with your breastbone (sternum). It causes pain in the chest and rib area. Costochondritis usually  goes away on its own over time. It can take up to 6 weeks or longer to get better, especially if you are unable to limit your activities. CAUSES  Some cases of costochondritis have no known cause. Possible causes include:  Injury (trauma).  Exercise or activity such as lifting.  Severe coughing. SIGNS AND SYMPTOMS  Pain and tenderness in the chest and rib area.  Pain that gets worse when coughing or taking deep breaths.  Pain that gets worse with specific movements. DIAGNOSIS  Your health care provider will do a physical exam and ask about your symptoms. Chest X-rays or other tests may be done to rule out other problems. TREATMENT  Costochondritis usually goes away on its own over time. Your health care provider may prescribe medicine to help relieve pain. HOME CARE INSTRUCTIONS   Avoid exhausting physical activity. Try not to strain your ribs during normal activity. This would include any activities using chest, abdominal, and side muscles, especially if heavy weights are used.  Apply ice to the affected area for the first 2 days after the pain begins.  Put ice in a plastic bag.  Place a towel between your skin and the bag.  Leave the ice on for 20 minutes, 2-3 times a day.  Only take over-the-counter or prescription medicines as directed by your health care provider. SEEK MEDICAL CARE IF:  You have redness or swelling at the rib joints. These are signs of infection.  Your pain does not go away despite rest or medicine. SEEK IMMEDIATE MEDICAL CARE IF:   Your pain  increases or you are very uncomfortable.  You have shortness of breath or difficulty breathing.  You cough up blood.  You have worse chest pains, sweating, or vomiting.  You have a fever or persistent symptoms for more than 2-3 days.  You have a fever and your symptoms suddenly get worse. MAKE SURE YOU:   Understand these instructions.  Will watch your condition.  Will get help right away if you are  not doing well or get worse.   This information is not intended to replace advice given to you by your health care provider. Make sure you discuss any questions you have with your health care provider.   Document Released: 07/29/2005 Document Revised: 08/09/2013 Document Reviewed: 05/23/2013 Elsevier Interactive Patient Education [employer].

## 2015-10-02 NOTE — ED Notes (Signed)
Pt c/o intermittent L chest pain w/ deep breathing and SOB x "a couple days."  Pain score 10/10 w/ inhalation and movement.  Sts "it feels like something is stuck in there."  Hx of HTN.  Sts chronic back pain, n/v, and bilateral hand numbness.

## 2015-10-02 NOTE — ED Notes (Signed)
Pt ambulated to BR and back to room w/o assistance. Pt is A&O and walks with steady gait. Will d/c pt 20 minutes from time of toradol admin

## 2015-10-02 NOTE — ED Provider Notes (Signed)
CSN: [id number]     Arrival date & time 10/02/15  0709 History   First MD Initiated Contact with Patient 10/02/15 0720     Chief Complaint  Patient presents with  . Chest Pain     Patient is a 54 y.o. female presenting with chest pain. The history is provided by the patient. No language interpreter was used.  Chest Pain  [name] is a 54 y.o. female who presents to the Emergency Department complaining of chest pain. She awoke early this morning with central left-sided chest pain. The pain is described as a sharp and sticking-type sensation. It is worse with deep breaths and slightly worsened with movement. She felt a mild discomfort yesterday. She denies any fevers. She has a slight cough, nausea. She has chronic bilateral lower extremity edema, unchanged from baseline. Symptoms are moderate, constant, worsening.  Past Medical History  Diagnosis Date  . Hypertension   . Edema   . Pain in limb   . Back pain   . Asthma   . MVP (mitral valve prolapse) 12/22/2011  . S/P dilatation of esophageal stricture 12/22/2011  . Positive ANA (antinuclear antibody) 01/29/2015  . Lupus ([place])   . Diastolic heart failure [facility])    Past Surgical History  Procedure Laterality Date  . Abdominal hysterectomy    . Cardiac catheterization    . Tonsillectomy    . Spine surgery     Family History  Problem Relation Age of Onset  . Diabetes Mother   . Hypertension Mother   . Heart disease Mother   . Diabetes Father   . Hypertension Father   . COPD Father   . Heart attack Father 85  . Stroke Other   . Coronary artery disease Other    Social History  Substance Use Topics  . Smoking status: Never Smoker   . Smokeless tobacco: Never Used  . Alcohol Use: No   OB History    No data available     Review of Systems  Cardiovascular: Positive for chest pain.  All other systems reviewed and are negative.     Allergies  Morphine and related; Other; Pantoprazole sodium; and Prednisone  Home  Medications   Prior to Admission medications   Medication Sig Start Date End Date Taking? Authorizing Provider  aspirin 81 MG chewable tablet Chew 81 mg by mouth daily.    Historical Provider, MD  cyclobenzaprine (FLEXERIL) 10 MG tablet Take 10 mg by mouth 3 (three) times daily as needed for muscle spasms.    Historical Provider, MD  enalapril (VASOTEC) 20 MG tablet Take 0.5 tablets (10 mg total) by mouth daily. 09/23/15   Konrad Felix, PA  furosemide (LASIX) 20 MG tablet Take 0.5 tablets (10 mg total) by mouth daily as needed. 08/03/15   Debbe Odea, MD  furosemide (LASIX) 40 MG tablet  08/26/15   Historical Provider, MD  hydrochlorothiazide (HYDRODIURIL) 25 MG tablet Take 1 tablet (25 mg total) by mouth daily. 09/23/15   Konrad Felix, PA  Multiple Vitamin (MULTIVITAMIN WITH MINERALS) TABS tablet Take 1 tablet by mouth daily.    Historical Provider, MD   BP 157/111 mmHg  Pulse 85  Temp(Src) 98.4 F (36.9 C) (Oral)  Resp 21  SpO2 100% Physical Exam  Constitutional: She is oriented to person, place, and time. She appears well-developed and well-nourished.  HENT:  Head: Normocephalic and atraumatic.  Cardiovascular: Normal rate and regular rhythm.   No murmur heard. Pulmonary/Chest: Effort normal and  breath sounds normal. No respiratory distress.  Abdominal: Soft. There is no rebound and no guarding.  Mild epigastric tenderness  Musculoskeletal: She exhibits no tenderness.  Trace nonpitting edema in bilateral lower extremities  Neurological: She is alert and oriented to person, place, and time.  Skin: Skin is warm and dry.  Psychiatric: She has a normal mood and affect. Her behavior is normal.  Nursing note and vitals reviewed.   ED Course  Procedures (including critical care time) Labs Review Labs Reviewed  BASIC METABOLIC PANEL - Abnormal; Notable for the following:    Glucose, Bld 115 (*)    All other components within normal limits  HEPATIC FUNCTION PANEL - Abnormal;  Notable for the following:    Bilirubin, Direct <0.1 (*)    All other components within normal limits  CBC  LIPASE, BLOOD  D-DIMER, QUANTITATIVE (NOT AT [facility])  Randolm Idol, ED    Imaging Review Dg Chest 2 View  10/02/2015  CLINICAL DATA:  54 year old female with intermittent left chest pain and shortness of breath increasing for several days. Dizziness. Initial encounter. EXAM: CHEST  2 VIEW COMPARISON:  11/20/2014 and earlier. FINDINGS: Lung volumes remain normal with mild elevation of the right hemidiaphragm. Stable cardiac size at the upper limits of normal. Other mediastinal contours are within normal limits. Visualized tracheal air column is within normal limits. The lungs are clear. No pneumothorax or effusion. No acute osseous abnormality identified. IMPRESSION: No acute cardiopulmonary abnormality. Electronically Signed   By: Genevie Ann M.D.   On: 10/02/2015 07:34   I have personally reviewed and evaluated these images and lab results as part of my medical decision-making.   EKG Interpretation   Date/Time:  Wednesday October 02 2015 07:16:15 EST Ventricular Rate:  73 PR Interval:  177 QRS Duration: 97 QT Interval:  395 QTC Calculation: 435 R Axis:     Text Interpretation:  Sinus rhythm Probable left atrial enlargement RSR'  in V1 or V2, probably normal variant Confirmed by Hazle Coca [phone]) on  10/02/2015 8:35:57 AM      MDM   Final diagnoses:  Costochondritis    Patient here for evaluation of pleuritic chest pain. Patient is low risk for PE, d-dimer negative, presentation is not consistent with PE. Presentation is also not consistent with CHF, pneumonia, ACS, dissection. Discussed with patient having care for costochondritis, PCP follow-up, return precautions.  Quintella Reichert, MD 10/02/15 [phone]

## 2015-10-07 ENCOUNTER — Ambulatory Visit (INDEPENDENT_AMBULATORY_CARE_PROVIDER_SITE_OTHER): Payer: BLUE CROSS/BLUE SHIELD | Admitting: Neurology

## 2015-10-07 DIAGNOSIS — G4720 Circadian rhythm sleep disorder, unspecified type: Secondary | ICD-10-CM

## 2015-10-07 DIAGNOSIS — G4733 Obstructive sleep apnea (adult) (pediatric): Secondary | ICD-10-CM | POA: Diagnosis not present

## 2015-10-07 DIAGNOSIS — G479 Sleep disorder, unspecified: Secondary | ICD-10-CM

## 2015-10-08 NOTE — Sleep Study (Signed)
Please see the scanned sleep study interpretation located in the Procedure tab within the Chart Review section. 

## 2015-10-08 NOTE — Progress Notes (Signed)
Pt r/s 72-hr AMB EEG to 12/29-11/03/15.

## 2015-10-09 ENCOUNTER — Telehealth: Payer: Self-pay | Admitting: Neurology

## 2015-10-09 NOTE — Telephone Encounter (Signed)
I spoke to patient and she is aware of results and recommendations. She voiced understanding. I will send report to PCP.

## 2015-10-09 NOTE — Telephone Encounter (Signed)
Pt returned Diana's call. Can leave msg on VM

## 2015-10-09 NOTE — Telephone Encounter (Signed)
Mobile number did not have vm. I called house number and left message to call back for sleep study results.

## 2015-10-09 NOTE — Telephone Encounter (Signed)
Patient referred by Dr. Jaynee Eagles, seen by me on 09/17/15, diagnostic PSG on 10/07/15.   Please call and notify the patient that the recent sleep study did not show any significant obstructive sleep apnea. She has mild REM related OSA, for which avoidance of the supine position and weight loss may constitute sufficient therapy. CPAP therapy is not warranted. Please inform patient that she can FU with Dr. Jaynee Eagles. Also, route or fax report to PCP and referring MD, if other than PCP.  Once you have spoken to patient, you can close this encounter.   Thanks,  Star Age, MD, PhD [facility])

## 2015-10-10 ENCOUNTER — Encounter: Payer: BLUE CROSS/BLUE SHIELD | Admitting: Neurology

## 2015-10-14 ENCOUNTER — Ambulatory Visit: Payer: BLUE CROSS/BLUE SHIELD | Admitting: Neurology

## 2015-10-14 ENCOUNTER — Encounter: Payer: Self-pay | Admitting: Neurology

## 2015-10-23 ENCOUNTER — Encounter: Payer: Self-pay | Admitting: Adult Health

## 2015-10-23 ENCOUNTER — Ambulatory Visit (INDEPENDENT_AMBULATORY_CARE_PROVIDER_SITE_OTHER): Payer: BLUE CROSS/BLUE SHIELD | Admitting: Adult Health

## 2015-10-23 VITALS — BP 138/90 | Temp 98.1°F | Ht 67.0 in | Wt 212.5 lb

## 2015-10-23 DIAGNOSIS — R52 Pain, unspecified: Secondary | ICD-10-CM

## 2015-10-23 DIAGNOSIS — Z7689 Persons encountering health services in other specified circumstances: Secondary | ICD-10-CM

## 2015-10-23 DIAGNOSIS — I10 Essential (primary) hypertension: Secondary | ICD-10-CM | POA: Diagnosis not present

## 2015-10-23 DIAGNOSIS — Z7189 Other specified counseling: Secondary | ICD-10-CM | POA: Diagnosis not present

## 2015-10-23 MED ORDER — FUROSEMIDE 40 MG PO TABS: 40.0000 mg | ORAL_TABLET | Freq: Every day | ORAL | Status: DC

## 2015-10-23 MED ORDER — ENALAPRIL MALEATE 20 MG PO TABS: 20.0000 mg | ORAL_TABLET | Freq: Every day | ORAL | Status: DC

## 2015-10-23 NOTE — Progress Notes (Signed)
HPI:  [name] is here to establish care. She is a pleasant AA female who  has a past medical history of Hypertension; Edema; Pain in limb; Back pain; Asthma; MVP (mitral valve prolapse) (12/22/2011); S/P dilatation of esophageal stricture (12/22/2011); Positive ANA (antinuclear antibody) (01/29/2015); Lupus ([place]); Diastolic heart failure ([place]); IBS (irritable bowel syndrome); Anxiety; Syncopal episodes; TIA (transient ischemic attack); and Chronic pain.   She expresses concern due to her multiple health problems and feels as though " No doctor has ever done much for me, I feel like they all just sweep me under the carpet and nothing gets done to help me out."   Last PCP and physical: 01/2015 with NP Calone  Has the following chronic problems that require follow up and concerns today:  HTN - This is an area of concern for the patient. She feels as though her previous providers " just played games with my blood pressure, they kept changing up the meds." She endorses that she continues to run high " in the 160's-170's." Today in the office her BP is 138/90  Lupus - She was being seen at [facility] for Lupus. She feels as though " they weren't taking it serious." She is going to be seen at [facility] clinic in January. She has " constant pain due to my lupus." She did have a positive A[facility] drawn [facility].      ROS negative for unless reported above: fevers, chills,feeling poorly, unintentional weight loss, hearing or vision loss, chest pain, palpitations, leg claudication, struggling to breath,Not feeling congested in the chest, no orthopenia, no cough,no wheezing, normal appetite, no soft tissue swelling, no hemoptysis, melena, hematochezia, hematuria, falls, loc, si, or thoughts of self harm.  Immunizations:UTD Diet: Eats healthy Exercise: Does not exercise as much as she used too.  Colonoscopy: 2016  - was  Mammogram: May 2016  Followed By:  - Rheumatologist - Will  be seeing [place]  - Neurology - [facility]  - Does not have a cardiologist or pain specialist.   Past Medical History  Diagnosis Date  . Hypertension   . Edema   . Pain in limb   . Back pain   . Asthma   . MVP (mitral valve prolapse) 12/22/2011  . S/P dilatation of esophageal stricture 12/22/2011  . Positive ANA (antinuclear antibody) 01/29/2015  . Lupus ([place])   . Diastolic heart failure [facility])     Past Surgical History  Procedure Laterality Date  . Abdominal hysterectomy    . Cardiac catheterization    . Tonsillectomy    . Spine surgery      Family History  Problem Relation Age of Onset  . Diabetes Mother   . Hypertension Mother   . Heart disease Mother   . Diabetes Father   . Hypertension Father   . COPD Father   . Heart attack Father 6  . Stroke Other   . Coronary artery disease Other     Social History   Social History  . Marital Status: Legally Separated    Spouse Name: N/A  . Number of Children: 3  . Years of Education: 18   Occupational History  . Student      counseling   Social History Main Topics  . Smoking status: Never Smoker   . Smokeless tobacco: Never Used  . Alcohol Use: No  . Drug Use: No  . Sexual Activity: Not Asked   Other Topics Concern  . None  Social History Narrative   Born and raised in [place], [place]. Currently resides in an apartment with her daughter. No pets. Fun: singing/vocalist;   Denies religious beliefs that would effect health care.       Caffeine use: 1 cup every morning     Current outpatient prescriptions:  .  aspirin 81 MG chewable tablet, Chew 81 mg by mouth daily., Disp: , Rfl:  .  Cyanocobalamin (B-12 PO), Take 2 tablets by mouth daily., Disp: , Rfl:  .  cyclobenzaprine (FLEXERIL) 10 MG tablet, Take 10 mg by mouth 3 (three) times daily as needed for muscle spasms., Disp: , Rfl:  .  enalapril (VASOTEC) 20 MG tablet, Take 0.5 tablets (10 mg total) by mouth daily., Disp: 30 tablet, Rfl:  0 .  furosemide (LASIX) 40 MG tablet, , Disp: , Rfl: 2 .  Multiple Vitamin (MULTIVITAMIN WITH MINERALS) TABS tablet, Take 1 tablet by mouth daily., Disp: , Rfl:  .  naproxen sodium (ANAPROX) 220 MG tablet, Take 220 mg by mouth 2 (two) times daily as needed (pain)., Disp: , Rfl:  .  Probiotic Product (PROBIOTIC PO), Take 1 capsule by mouth daily., Disp: , Rfl:  .  LINZESS 290 MCG CAPS capsule, , Disp: , Rfl: 0  EXAM:  Filed Vitals:   10/23/15 1302  Temp: 98.1 F (36.7 C)    Body mass index is 33.27 kg/(m^2).  GENERAL: vitals reviewed and listed above, alert, oriented, appears well hydrated and in no acute distress.Slightly obese  HEENT: atraumatic, conjunttiva clear, no obvious abnormalities on inspection of external nose and ears  NECK: Neck is soft and supple without masses, no adenopathy or thyromegaly, trachea midline, no JVD. Normal range of motion.   LUNGS: clear to auscultation bilaterally, no wheezes, rales or rhonchi, good air movement  CV: Regular rate and rhythm, normal S1/S2, no audible murmurs, gallops, or rubs. No carotid bruit and no peripheral edema.   MS: moves all extremities without noticeable abnormality. No edema noted  Abd: soft/nontender/nondistended/normal bowel sounds   Skin: warm and dry, no rash   Extremities: No clubbing, cyanosis, or edema. Capillary refill is WNL. Pulses intact bilaterally in upper and lower extremities.   Neuro: CN II-XII intact, sensation and reflexes normal throughout, 5/5 muscle strength in bilateral upper and lower extremities. Normal finger to nose. Normal rapid alternating movements. Normal romberg. No pronator drift.   PSYCH: pleasant and cooperative, no obvious depression. She is anxious and is constantly moving her hands and fingers during exam.   ASSESSMENT AND PLAN:  1. Encounter to establish care - Follow up in one month or sooner if needed.  - Encouraged to continue to eat healthy and exercise.  I think by  getting her pain under control she will be less anxious and frustrated, which will lead to more exercise and weight loss.   2. Generalized pain  - Ambulatory referral to Pain Clinic  3. Essential hypertension - Stay at current dose. Monitor blood pressures at home and place in log. Bring log to next visit. If BP consistent above 150, then please let me know.  - enalapril (VASOTEC) 20 MG tablet; Take 1 tablet (20 mg total) by mouth daily.  Dispense: 30 tablet; Refill: 6 - furosemide (LASIX) 40 MG tablet; Take 1 tablet (40 mg total) by mouth daily.  Dispense: 30 tablet; Refill: 6 - Consider changing or going up on blood pressure medication  -We reviewed the PMH, PSH, FH, SH, Meds and Allergies. -We provided refills for  any medications we will prescribe as needed. -We addressed current concerns per orders and patient instructions. -We have asked for records for pertinent exams, studies, vaccines and notes from previous providers. -We have advised patient to follow up per instructions below.   -Patient advised to return or notify a provider immediately if symptoms worsen or persist or new concerns arise.    Dorothyann Peng, AGNP

## 2015-10-23 NOTE — Progress Notes (Signed)
Pre visit review using our clinic review tool, if applicable. No additional management support is needed unless otherwise documented below in the visit note. 

## 2015-10-23 NOTE — Patient Instructions (Signed)
It was great meeting you today!  Continue with the current medications and monitor your blood pressure in the morning and at night. Bring your log to the next appointment. If you notice your blood pressures are above 170 consistently, please let me know.   Someone from the pain clinic will call you to schedule an appointment.   Work on the frustration you are experiencing, it is normal to have that frustration, I just think you are setting yourself back with it.

## 2015-11-10 ENCOUNTER — Telehealth: Payer: Self-pay | Admitting: *Deleted

## 2015-11-10 NOTE — Telephone Encounter (Signed)
Spoke to pt. R/s appt for tomorrow to 11/15/15 d/t inclement weather. Pt knows to check in at 1245pm.

## 2015-11-11 ENCOUNTER — Ambulatory Visit: Payer: BLUE CROSS/BLUE SHIELD | Admitting: Neurology

## 2015-11-14 ENCOUNTER — Ambulatory Visit: Payer: BLUE CROSS/BLUE SHIELD | Admitting: Adult Health

## 2015-11-15 ENCOUNTER — Ambulatory Visit (INDEPENDENT_AMBULATORY_CARE_PROVIDER_SITE_OTHER): Payer: BLUE CROSS/BLUE SHIELD | Admitting: Neurology

## 2015-11-15 ENCOUNTER — Encounter: Payer: Self-pay | Admitting: Neurology

## 2015-11-15 VITALS — BP 172/96 | HR 58 | Ht 67.0 in | Wt 215.2 lb

## 2015-11-15 DIAGNOSIS — G8929 Other chronic pain: Secondary | ICD-10-CM

## 2015-11-15 NOTE — Progress Notes (Addendum)
[facility]    Provider:  Dr Jaynee Eagles Referring Provider: Dorothyann Peng, NP Primary Care Physician:  Dorothyann Peng, NP  CC: Altered awareness and other multiple neurologic complaints  Interval history: She went to the [facility] and they think she has Fibromyalgia.  Her blood pressure is still high. She is complaining of diffuse and multiple pain complaints all over her body. Pain in her entire body (see below original HPI for more details). She has no feelings in her fingers. She is seeing Dorothyann Peng next week who is her pcp. She had a sleep study without significant findings. They recommended weight loss and sleeping on the side. Today her neck hurts the most. I ordered an emg/ncs at our last appointment but she did not complete it. Pain in her neck is getting worse. She can't sleep due to the pain. I ordered a 3-day EEG and she cancelled that as well. She has not been compliant with my recommendations.  Sleep study results: "Please call and notify the patient that the recent sleep study did not show any significant obstructive sleep apnea. She has mild REM related OSA, for which avoidance of the supine position and weight loss may constitute sufficient therapy. CPAP therapy is not warranted"  HPI: [name] is a 55 y.o. female here as a referral from Dr. Karlton Lemon for altered awareness. 3 years ago she was having some sciatica in her right leg. She was active, walking, lifting weight. She had some shots in her belly for cosmetic fat in the stomach. After that she went to the back doctor and had lumbar surgery and after that she didn't have physical therapy and her leg started hurting more. Around December 3 years ago she started having swelling in the abdomen and had an endoscopy. She was in the heart unit for 4-5 days due to severe body pain, chest pain , numbness in the arm and she was extensively worked up. She was on Morphine and still in pain. She was told the  pain was coming from her back. She is being managed by Dr. Christella Noa. She has been in pain since then, some days are worse and some days are better. She has suffered every day with pain. Left foot has been swollen on the left and they can't figure it out. About 2 years ago started passing out. Still had swollen foot. She had extensive cardiac evaluation. She followed up with a rheumatologist. The first time she passed out was around that time,2 years ago. She remembers a headache, dizziness, not feeling well and then doesn't remember about it. In the last 2 years has happened 5 times. One lady said she was jerking like she was having a seizure, she stares, she doesn't recognize anyone, staring. She remembers one 3 months where she had a headache, she felt heaviness in her chest, she couldn't talk, she remembers looking at her son but everything was like a daydream, she could see people but couldn't respond, she had numbness and wanted to talk but couldn't talk, everyone looked different. She just remembers that part of it, she doesn't remember everything. She can do something today but at the end of the day she doesn't remember. She had an EEG when she was younger which was abnormal. She has passing out spells as a teenager and they were attributed to stress and anxiety. When she passes out the spells last 1-2 hours before she feels normal, unsure how long the convulsion lasts she doesn't really  know. No urination, no defectaion or biting her tongue. She is excessively tired, she has gained weight, she snores, she wakes up with headaches and a puffy face. She has tingling in the thumb, she drops things, weakness in the hands.   Reviewed notes, labs and imaging from outside physicians, which showed: She was hospitalized beginning of October for hypertensive urgency due to noncompliance of medication. Neurology notes stated that she was at work when noted she was not acting correctly and she appearedout and complained  of nausea, neurologic exam was unremarkable, CT of the head was normal, her blood pressure was elevated at 174/104 and she had not been taking her blood pressure medicationsShe also reported having dizzy spells and MRI/MRA was unremarkable, symptoms thought to be secondary to uncontrolled blood pressure.   EG in October 20 16th was normal awake and asleep. Echo in October 20 16th showed an ejection fraction of 60-65%, no evidence for mitral prolapse, trivial tricuspid and pulmonic valve regurgitation, grade 2 diastolic dysfunction. Carotid duplex no evidence of ICA stenosis bilaterally, vertebral artery flow is antegrade.   MRI HEAD (personally reviewed images and agree with the following)  No acute infarct.  No evidence of posterior reversible encephalopathy syndrome.  No intracranial hemorrhage.  No hydrocephalus.  No intracranial mass lesion noted on this unenhanced exam.  C3-4 bulge and mild spinal stenosis.  MRA HEAD  Anterior circulation without medium or large size vessel significantstenosis or occlusion. Small linear artifact extends through the A1 segment of the right anterior cerebral artery.  Right vertebral artery is dominant.  Left vertebral artery is small, narrowed and irregular after takeoff of the left posterior inferior cerebellar artery.  No significant stenosis of the basilar artery.  Nonvisualized right anterior inferior cerebellar artery.  Artifact versus minimal narrowing P2 segment left posterior cerebral artery.  Review of Systems: Patient complains of symptoms per HPI as well as the following symptoms: Weight gain, fatigue, blurred vision, shortness of breath, murmur, wheezing, swelling in legs, snoring, flushing, hearing loss, ringing in ears, trouble swallowing, constipation, joint pain, joint swelling, cramps, aching muscles, rash, itching, moles, skin sensitivity, memory loss, confusion, numbness, weakness, slurred speech, difficulty  swallowing, dizziness, passing out, insomnia, sleepiness, snoring, restless legs, depression, not enough sleep, decreased energy, disinterest in activities, racing thoughts. Pertinent negatives per HPI. All others negative.  Social History   Social History  . Marital Status: Legally Separated    Spouse Name: N/A  . Number of Children: 3  . Years of Education: 18   Occupational History  . Student      counseling   Social History Main Topics  . Smoking status: Never Smoker   . Smokeless tobacco: Never Used  . Alcohol Use: No  . Drug Use: No  . Sexual Activity: Not on file   Other Topics Concern  . Not on file   Social History Narrative   Born and raised in [place], [place]. Currently resides in an apartment with her daughter. No pets. Fun: singing/vocalist;   Denies religious beliefs that would effect health care.       Caffeine use: 1 cup every morning    Family History  Problem Relation Age of Onset  . Diabetes Mother   . Hypertension Mother   . Heart disease Mother   . Diabetes Father   . Hypertension Father   . COPD Father   . Heart attack Father 4  . Stroke Other   . Coronary artery disease Other   .  Arthritis/Rheumatoid Mother   . Emphysema Father     Past Medical History  Diagnosis Date  . Hypertension   . Edema   . Pain in limb   . Back pain   . Asthma   . MVP (mitral valve prolapse) 12/22/2011  . S/P dilatation of esophageal stricture 12/22/2011  . Positive ANA (antinuclear antibody) 01/29/2015  . Lupus ([place])   . Diastolic heart failure ([place])   . IBS (irritable bowel syndrome)   . Anxiety   . Syncopal episodes   . TIA (transient ischemic attack)   . Chronic pain     Past Surgical History  Procedure Laterality Date  . Abdominal hysterectomy    . Cardiac catheterization    . Tonsillectomy    . Spine surgery      Current Outpatient Prescriptions  Medication Sig Dispense Refill  . aspirin 81 MG chewable tablet Chew 81 mg by mouth daily.    .  Cyanocobalamin (B-12 PO) Take 2 tablets by mouth daily.    . enalapril (VASOTEC) 20 MG tablet Take 20 mg by mouth daily.    . furosemide (LASIX) 40 MG tablet Take 1 tablet (40 mg total) by mouth daily. 30 tablet 6  . LINZESS 290 MCG CAPS capsule   0  . Multiple Vitamin (MULTIVITAMIN WITH MINERALS) TABS tablet Take 1 tablet by mouth daily.    . Probiotic Product (PROBIOTIC PO) Take 1 capsule by mouth daily.     No current facility-administered medications for this visit.    Allergies as of 11/15/2015 - Review Complete 10/23/2015  Allergen Reaction Noted  . Hydrochlorothiazide  10/02/2015  . Morphine and related Other (See Comments)   . Other Other (See Comments) 08/02/2015  . Pantoprazole sodium Other (See Comments) 08/19/2011  . Prednisone Other (See Comments) 08/19/2011    Vitals: BP 172/96 mmHg  Pulse 58  Ht 5\' 7"  (1.702 m)  Wt 215 lb 3.2 oz (97.614 kg)  BMI 33.70 kg/m2 Last Weight:  Wt Readings from Last 1 Encounters:  11/15/15 215 lb 3.2 oz (97.614 kg)   Last Height:   Ht Readings from Last 1 Encounters:  11/15/15 5\' 7"  (1.702 m)     Cognition:  The patient is oriented to person, place, and time;   recent and remote memory intact;   language fluent;   normal attention, concentration,   fund of knowledge Cranial Nerves:  The pupils are equal, round, and reactive to light. The fundi are flat. Visual fields are full to finger confrontation. Extraocular movements are intact. Trigeminal sensation is intact and the muscles of mastication are normal. The face is symmetric. The palate elevates in the midline. Hearing intact. Voice is normal. Shoulder shrug is normal. The tongue has normal motion without fasciculations.   Coordination:  Normal finger to nose and heel to shin.   Gait:  Heel-toe and tandem gait are normal.   Motor Observation:  No asymmetry, no atrophy, and no involuntary movements noted. Tone:  Normal muscle tone.    Posture:  Posture is normal. normal erect   Strength:  Strength is V/V in the upper and lower limbs.    Sensation: intact to LT   Reflex Exam:  DTR's:  Deep tendon reflexes in the upper and lower extremities are normal bilaterally.  Toes:  The toes are downgoing bilaterally.  Clonus:  Clonus is absent.      Assessment/Plan: 55 year old female with what appears to be a long history of loss of consciousness or altered mentation  which was diagnosed as stress and anxiety induced in her teens and diffuse complains to pain and multiple other neurologic and other body complaints, she has been evaluated by multiple Doctors without an etiology and [facility] recently diagnosed her with Fibromyalgia.. She reports 5 altered consciousness episodes in the last 2 years. She can't give me an exact description of what happens, sometimes she does not remember it but has been told that she convulsed, another time she does remember it and remembers people talking to her like she was in a daydream but she couldn't answer. She was hospitalized recently and Dxed with hypertensive urgency and MRI/MRA, echo, carotids, ekg performed. She has other multiple neurologic complaints including sciatica and tingling in her fingers and all over body pain. She describes headaches nocturnally as well as snoring and excessive daytime fatigue..  Emg/ncs all 4 limbs: evaluate for median/ulnar neuropathy, polyneuropathy, radiculopathy. 4 limb EMG was significant for moderately severe left greater than right carpal tunnel syndrome. No suggestion of polyneuropathy or radiculopathy. 3-day eeg: ambulatory, video for episodes of alteration of consciousness. If negative, do not recommend anti-epileptics - Normal. 72 hour video and deltoid EEG recorded over 70 hours from 12/13/2015 to 12/16/2015 was normal without any epileptiform activity. Stages 1 and 2 and 3 and REM sleep were observed. Patient slept for  approximately 6 hours every night. Patient log 3 events which did not have any epileptiform associated activity. EKG was regular with a heart rate of 72 beats per minute with no arrhythmias noted. Sleep study : Negative study. Patient reports excessive daytime somnolence, snoring, nocturnal headaches and morning headaches, weight gain and obesity. Evaluate for OSA and hypoventilation obesity syndrome. Results: "Please call and notify the patient that the recent sleep study did not show any significant obstructive sleep apnea. She has mild REM related OSA, for which avoidance of the supine position and weight loss may constitute sufficient therapy. CPAP therapy is not warranted" Highly encouraged her to manage her BP which is high today, she has follow up with her primary care in a few days.  Sarina Ill, MD  [facility] [address] [place], [place] [address]  Phone [phone] Fax [phone]

## 2015-11-15 NOTE — Patient Instructions (Addendum)
Remember to drink plenty of fluid, eat healthy meals and do not skip any meals. Try to eat protein with a every meal and eat a healthy snack such as fruit or nuts in between meals. Try to keep a regular sleep-wake schedule and try to exercise daily, particularly in the form of walking, 20-30 minutes a day, if you can.   I would like to see you back in for emg/ncs, sooner if we need to.    Our phone number is [phone]. We also have an after hours call service for urgent matters and there is a physician on-call for urgent questions. For any emergencies you know to call 911 or go to the nearest emergency room

## 2015-11-18 ENCOUNTER — Encounter: Payer: Self-pay | Admitting: Adult Health

## 2015-11-18 ENCOUNTER — Telehealth: Payer: Self-pay

## 2015-11-18 ENCOUNTER — Ambulatory Visit (INDEPENDENT_AMBULATORY_CARE_PROVIDER_SITE_OTHER): Payer: BLUE CROSS/BLUE SHIELD | Admitting: Adult Health

## 2015-11-18 DIAGNOSIS — I10 Essential (primary) hypertension: Secondary | ICD-10-CM | POA: Diagnosis not present

## 2015-11-18 DIAGNOSIS — M797 Fibromyalgia: Secondary | ICD-10-CM | POA: Diagnosis not present

## 2015-11-18 DIAGNOSIS — R079 Chest pain, unspecified: Secondary | ICD-10-CM | POA: Diagnosis not present

## 2015-11-18 DIAGNOSIS — F411 Generalized anxiety disorder: Secondary | ICD-10-CM | POA: Diagnosis not present

## 2015-11-18 LAB — CBC WITH DIFFERENTIAL/PLATELET
BASOS ABS: 0 10*3/uL (ref 0.0–0.1)
Basophils Relative: 0.5 % (ref 0.0–3.0)
EOS PCT: 3.6 % (ref 0.0–5.0)
Eosinophils Absolute: 0.2 10*3/uL (ref 0.0–0.7)
HEMATOCRIT: 40.3 % (ref 36.0–46.0)
Hemoglobin: 13.2 g/dL (ref 12.0–15.0)
LYMPHS PCT: 29.4 % (ref 12.0–46.0)
Lymphs Abs: 1.8 10*3/uL (ref 0.7–4.0)
MCHC: 32.8 g/dL (ref 30.0–36.0)
MCV: 90.4 fl (ref 78.0–100.0)
MONOS PCT: 6.9 % (ref 3.0–12.0)
Monocytes Absolute: 0.4 10*3/uL (ref 0.1–1.0)
NEUTROS ABS: 3.6 10*3/uL (ref 1.4–7.7)
Neutrophils Relative %: 59.6 % (ref 43.0–77.0)
PLATELETS: 201 10*3/uL (ref 150.0–400.0)
RBC: 4.46 Mil/uL (ref 3.87–5.11)
RDW: 13.5 % (ref 11.5–15.5)
WBC: 6.1 10*3/uL (ref 4.0–10.5)

## 2015-11-18 LAB — CORTISOL: Cortisol, Plasma: 5.9 ug/dL

## 2015-11-18 LAB — BASIC METABOLIC PANEL
BUN: 11 mg/dL (ref 6–23)
CHLORIDE: 102 meq/L (ref 96–112)
CO2: 29 meq/L (ref 19–32)
Calcium: 9.6 mg/dL (ref 8.4–10.5)
Creatinine, Ser: 0.77 mg/dL (ref 0.40–1.20)
GFR: 100.32 mL/min (ref >60.00)
Glucose, Bld: 86 mg/dL (ref 70–99)
POTASSIUM: 4.3 meq/L (ref 3.5–5.1)
SODIUM: 142 meq/L (ref 135–145)

## 2015-11-18 LAB — TSH: TSH: 0.87 u[IU]/mL (ref 0.35–4.50)

## 2015-11-18 MED ORDER — AMITRIPTYLINE HCL 25 MG PO TABS: 25.0000 mg | ORAL_TABLET | Freq: Every day | ORAL | Status: DC

## 2015-11-18 MED ORDER — FLUOXETINE HCL 20 MG PO TABS: 20.0000 mg | ORAL_TABLET | Freq: Every day | ORAL | Status: DC

## 2015-11-18 MED ORDER — METOPROLOL SUCCINATE ER 25 MG PO TB24: 25.0000 mg | ORAL_TABLET | Freq: Every day | ORAL | Status: DC

## 2015-11-18 NOTE — Telephone Encounter (Signed)
Pt called back and states her BP is 138/80 and it has not been that low in forever.  Advised pt that she was given Clonidine in the office to help bring her bp down slowly.  Pt wanted to know if she could get a prescription for this.  Advised pt that Tommi Rumps had added an additional bp medication and he wants her to monitor her bp readings and when she follows up in 2 weeks we can decide if she needs to add any additional medications.  Pt verbalized understanding.  Advised pt to monitor her bp and call the office with her readings.  Pt will call on Friday.

## 2015-11-18 NOTE — Telephone Encounter (Signed)
noted 

## 2015-11-18 NOTE — Patient Instructions (Addendum)
I have sent in a few prescriptions to the pharmacy  Fibromyelgia pain/Anxiety/Sleep Distrubance - Prozac 20mg  during the day and Amitriptyline 25 mg at night. These medications will not take affect right away, so be patient.   Hypertension - Continue with the Enalapril and start the Metoprolol.  - Continue to monitor your blood pressure at home.  - Someone from Cardiology will call you to set up an appointment.   I will follow up with you regarding your lab work  I want you to follow up with me in two weeks or sooner if needed.

## 2015-11-18 NOTE — Progress Notes (Signed)
Subjective:    Patient ID: [name], female    DOB: 05-11-1961, 55 y.o.   MRN: [id number]  HPI  Mr. Manolis is a 55 year old African-American female who presents to the office today for follow-up regarding her elevated blood pressure. He was recently seen at [facility] and they think that she has fibromyalgia area shortly she was seen her neurologist in 3 days ago, that time she is complaining of diffuse multiple pelvic pain complaints throughout her body as well as a complaint of having a feeling in her fingers during her office visit with the neurologist her blood pressure continued to be elevated. Neurology has ordered a nerve conduction study of all 4 limbs unfortunately patient did not have this completed neither did she have a 3 day EEG completed.   Today in the office her blood pressure continues to be elevated will originally 200/120, she endorses taking her blood pressure medication. Shortly she complains of vision changes, chest pain, squeezing feeling in her chest where she can't breathe, and she is not sleeping. During today's visit patient was taken to the restroom at which time he had to be helped to the floor by staff due to having a panic attack, when I arrived at the patient she was a per ventilating, she states that she could not see, and she had no idea where she was. He does have a history of anxiety. 2 L of oxygen via nasal cannula was placed on patient she was helped into a wheelchair and placed in a room. She was able to calm down and we are able to have a conversation. Patient is worried due to her hypertension( despite multiple medication therapies), as well as her family history of myocardial infarctions, hypertension, and stroke. He continues to feel like "the doctors don't care about me, it is in and out, in and out on a constant basis with these doctors."   She endorses that she continues to gain weight, she is not exercising but she is eating a healthy diet. She is  constantly in pain due to fibromyalgia.  She was given 0.1 mg of clonidine in the office today her blood pressure was decreased to 180/100 after 15 minutes  Review of Systems  Constitutional: Positive for activity change, fatigue and unexpected weight change. Negative for fever and appetite change.  Respiratory: Positive for chest tightness and shortness of breath. Negative for apnea, cough, choking and wheezing.   Cardiovascular: Positive for chest pain and palpitations. Negative for leg swelling.  Endocrine: Negative.   Musculoskeletal: Positive for myalgias, back pain, arthralgias and neck pain. Negative for joint swelling and neck stiffness.  Skin: Negative.   Neurological: Positive for dizziness, syncope, weakness, numbness and headaches.  Hematological: Negative.   Psychiatric/Behavioral: Positive for behavioral problems, sleep disturbance, decreased concentration and agitation. Negative for suicidal ideas, hallucinations, self-injury and dysphoric mood. The patient is nervous/anxious. The patient is not hyperactive.   All other systems reviewed and are negative.  Past Medical History  Diagnosis Date  . Hypertension   . Edema   . Pain in limb   . Back pain   . Asthma   . MVP (mitral valve prolapse) 12/22/2011  . S/P dilatation of esophageal stricture 12/22/2011  . Positive ANA (antinuclear antibody) 01/29/2015  . Lupus ([place])   . Diastolic heart failure ([place])   . IBS (irritable bowel syndrome)   . Anxiety   . Syncopal episodes   . TIA (transient ischemic attack)   .  Chronic pain     Social History   Social History  . Marital Status: Legally Separated    Spouse Name: N/A  . Number of Children: 3  . Years of Education: 18   Occupational History  . Student      counseling   Social History Main Topics  . Smoking status: Never Smoker   . Smokeless tobacco: Never Used  . Alcohol Use: No  . Drug Use: No  . Sexual Activity: Not on file   Other Topics Concern  . Not on  file   Social History Narrative   Born and raised in [place], [place]. Currently resides in an apartment with her daughter. No pets. Fun: singing/vocalist;   Denies religious beliefs that would effect health care.       Caffeine use: 1 cup every morning    Past Surgical History  Procedure Laterality Date  . Abdominal hysterectomy    . Cardiac catheterization    . Tonsillectomy    . Spine surgery      Family History  Problem Relation Age of Onset  . Diabetes Mother   . Hypertension Mother   . Heart disease Mother   . Diabetes Father   . Hypertension Father   . COPD Father   . Heart attack Father 3  . Stroke Other   . Coronary artery disease Other   . Arthritis/Rheumatoid Mother   . Emphysema Father     Allergies  Allergen Reactions  . Hydrochlorothiazide     cramps  . Morphine And Related Other (See Comments)    hallucinations  . Other Other (See Comments)    Other reaction(s): Other (See Comments) NARCOTICS=aggitation, hallucination Pt states allergic to "any narcotics"  . Pantoprazole Sodium Other (See Comments)    blisters  . Prednisone Other (See Comments)    Blisters on tongue    Current Outpatient Prescriptions on File Prior to Visit  Medication Sig Dispense Refill  . aspirin 81 MG chewable tablet Chew 81 mg by mouth daily.    . Cyanocobalamin (B-12 PO) Take 2 tablets by mouth daily.    . enalapril (VASOTEC) 20 MG tablet Take 20 mg by mouth daily.    . furosemide (LASIX) 40 MG tablet Take 1 tablet (40 mg total) by mouth daily. 30 tablet 6  . LINZESS 290 MCG CAPS capsule   0  . Multiple Vitamin (MULTIVITAMIN WITH MINERALS) TABS tablet Take 1 tablet by mouth daily.    . Probiotic Product (PROBIOTIC PO) Take 1 capsule by mouth daily.     No current facility-administered medications on file prior to visit.    BP 180/100 mmHg  Temp(Src) 97.7 F (36.5 C) (Oral)  Ht 5\' 7"  (1.702 m)  Wt 216 lb (97.977 kg)  BMI 33.82 kg/m2       Objective:   Physical  Exam  Constitutional: She is oriented to person, place, and time. She appears well-developed and well-nourished. No distress.  HENT:  Head: Normocephalic and atraumatic.  Right Ear: External ear normal.  Left Ear: External ear normal.  Nose: Nose normal.  Mouth/Throat: Oropharynx is clear and moist. No oropharyngeal exudate.  Neck: Normal range of motion. Neck supple. No tracheal deviation present. No thyromegaly present.  Cardiovascular: Normal rate, regular rhythm, normal heart sounds and intact distal pulses.  Exam reveals no gallop and no friction rub.   No murmur heard. Pulmonary/Chest: Effort normal and breath sounds normal. No respiratory distress. She has no wheezes. She has no rales.  She exhibits no tenderness.  Abdominal: Soft. Bowel sounds are normal. She exhibits no distension and no mass. There is no tenderness. There is no rebound and no guarding.  Obese   Musculoskeletal: Normal range of motion. She exhibits tenderness. She exhibits no edema.  Lymphadenopathy:    She has no cervical adenopathy.  Neurological: She is alert and oriented to person, place, and time.  Skin: Skin is warm and dry. No rash noted. She is not diaphoretic. No erythema. No pallor.  Psychiatric: She has a normal mood and affect. Her behavior is normal. Judgment and thought content normal.  Nursing note and vitals reviewed.      Assessment & Plan:  1. Essential hypertension -  Will r/o Pheochromocytoma due to not responding to antihypertensive medications.  - EKG 12-Lead - NSR, Rate 65 - Catecholamines, fractionated, urine, 24 hour; Future - Metanephrines, urine, 24 hour; Future - Basic metabolic panel - TSH - Cortisol - CBC with Differential/Platelet - metoprolol succinate (TOPROL-XL) 25 MG 24 hr tablet; Take 1 tablet (25 mg total) by mouth daily.  Dispense: 90 tablet; Refill: 3 - Ambulatory referral to Cardiology - Would like patient to follow up in two weeks or sooner if needed - Monitor  blood pressure at home.  -Consider prescription for clonidine 0.1 mg as needed   2. Generalized anxiety disorder - FLUoxetine (PROZAC) 20 MG tablet; Take 1 tablet (20 mg total) by mouth daily.  Dispense: 30 tablet; Refill: 3 - Consider referral to psychiatry 3. Fibromyalgia - FLUoxetine (PROZAC) 20 MG tablet; Take 1 tablet (20 mg total) by mouth daily.  Dispense: 30 tablet; Refill: 3 - amitriptyline (ELAVIL) 25 MG tablet; Take 1 tablet (25 mg total) by mouth at bedtime.  Dispense: 30 tablet; Refill: 3

## 2015-11-19 ENCOUNTER — Ambulatory Visit (INDEPENDENT_AMBULATORY_CARE_PROVIDER_SITE_OTHER): Payer: BLUE CROSS/BLUE SHIELD | Admitting: Cardiovascular Disease

## 2015-11-19 ENCOUNTER — Encounter: Payer: Self-pay | Admitting: Cardiovascular Disease

## 2015-11-19 VITALS — BP 140/90 | HR 80 | Ht 67.0 in | Wt 215.8 lb

## 2015-11-19 DIAGNOSIS — I10 Essential (primary) hypertension: Secondary | ICD-10-CM

## 2015-11-19 DIAGNOSIS — R0789 Other chest pain: Secondary | ICD-10-CM | POA: Diagnosis not present

## 2015-11-19 NOTE — Patient Instructions (Signed)
Medication Instructions:  Your physician recommends that you continue on your current medications as directed. Please refer to the Current Medication list given to you today.  Labwork: No new orders.   Testing/Procedures: Your physician has requested that you have an exercise stress myoview. For further information please visit [email or web address]. Please follow instruction sheet, as given.  Follow-Up: Your physician recommends that you schedule a follow-up appointment as needed with Dr Fletcher Anon.    Any Other Special Instructions Will Be Listed Below (If Applicable).     If you need a refill on your cardiac medications before your next appointment, please call your pharmacy.

## 2015-11-20 ENCOUNTER — Telehealth: Payer: Self-pay | Admitting: Adult Health

## 2015-11-20 ENCOUNTER — Ambulatory Visit: Payer: BLUE CROSS/BLUE SHIELD | Admitting: Adult Health

## 2015-11-20 NOTE — Telephone Encounter (Signed)
Patient came in the office today wanting to know if she could be referred to [facility] in [place] for hot flashes and gyn as well. She states that if her PCP refers her she can get in a lot quicker. Patient also wants to know if she can split FLUoxetine (PROZAC) 20 MG tablet in half. And wanted to know if the MD know she has asthma.

## 2015-11-21 ENCOUNTER — Encounter (HOSPITAL_COMMUNITY): Payer: Self-pay | Admitting: Emergency Medicine

## 2015-11-21 ENCOUNTER — Encounter: Payer: Self-pay | Admitting: Cardiovascular Disease

## 2015-11-21 ENCOUNTER — Emergency Department (HOSPITAL_COMMUNITY)
Admission: EM | Admit: 2015-11-21 | Discharge: 2015-11-22 | Disposition: A | Discharge status: Home / Self Care | Payer: BLUE CROSS/BLUE SHIELD | Attending: Emergency Medicine | Admitting: Emergency Medicine

## 2015-11-21 DIAGNOSIS — Z8673 Personal history of transient ischemic attack (TIA), and cerebral infarction without residual deficits: Secondary | ICD-10-CM | POA: Insufficient documentation

## 2015-11-21 DIAGNOSIS — Z79899 Other long term (current) drug therapy: Secondary | ICD-10-CM | POA: Insufficient documentation

## 2015-11-21 DIAGNOSIS — M797 Fibromyalgia: Secondary | ICD-10-CM | POA: Insufficient documentation

## 2015-11-21 DIAGNOSIS — Z7982 Long term (current) use of aspirin: Secondary | ICD-10-CM | POA: Diagnosis not present

## 2015-11-21 DIAGNOSIS — K589 Irritable bowel syndrome without diarrhea: Secondary | ICD-10-CM | POA: Diagnosis not present

## 2015-11-21 DIAGNOSIS — J449 Chronic obstructive pulmonary disease, unspecified: Secondary | ICD-10-CM | POA: Insufficient documentation

## 2015-11-21 DIAGNOSIS — Z9889 Other specified postprocedural states: Secondary | ICD-10-CM | POA: Insufficient documentation

## 2015-11-21 DIAGNOSIS — F419 Anxiety disorder, unspecified: Secondary | ICD-10-CM | POA: Diagnosis not present

## 2015-11-21 DIAGNOSIS — I10 Essential (primary) hypertension: Secondary | ICD-10-CM

## 2015-11-21 DIAGNOSIS — I5030 Unspecified diastolic (congestive) heart failure: Secondary | ICD-10-CM | POA: Diagnosis not present

## 2015-11-21 DIAGNOSIS — G8929 Other chronic pain: Secondary | ICD-10-CM | POA: Diagnosis not present

## 2015-11-21 HISTORY — DX: Fibromyalgia: M79.7

## 2015-11-21 HISTORY — DX: Other chest pain: R07.89

## 2015-11-21 NOTE — Assessment & Plan Note (Addendum)
This seems to be driven by stress anxiety most of the time. Thus, I think a beta blocker is a good choice. She has not started the medication yet. She is only on enalapril. She had recent labs including thyroid function and Cortisol. Both of them were unremarkable. She is also getting workup for pheochromocytoma. I think overall there is low suspicion for secondary hypertension. If blood pressure remains elevated,can consider switching metoprolol to carvedilol.

## 2015-11-21 NOTE — Assessment & Plan Note (Addendum)
Her chest pain is overall atypica and likely triggered by stress and anxiety. She had previous cardiac catheterization in 2015 at [facility] which showed no significant coronary artery disease. She does have strong family history of coronary artery disease and she is concerned about that. Thus, I requested a treadmill nuclear stress test for evaluation.

## 2015-11-21 NOTE — Progress Notes (Signed)
Primary care provider: Dorothyann Peng, NP  HPI  This is a 55 year old female who was referred by Hilaria Ota evaluation of chest pain and hypertension.she reports history of mitral valve prolapse which was not confirmed by most recent echocardiogram. She used to be on propranolol in the past which was discontinued due to bradycardia.she has known history of anxiety, depression and panic attacks. She reports history of hypertension for at least 20 years. She had back surgery last year and reports that her blood pressure became controlled after that. She was hospitalized in October 2016 with dizziness and elevated blood pressure with some chest discomfort. MRI and MRA of the brain was unremarkable. Echocardiogram showed normal LV systolic function with no evidence of mitral valve prolapse and grade 2 diastolic dysfunction. She had prior cardiac evaluation at [facility] with cardiac catheterization September 2015 which showed normal coronary arteries. She also had an echocardiogram there in August 2015 which was overall unremarkable.  She complains of generalized pain in her joints and extremities.the impression that she might have fibromyalgia. During a routine office visits recently she had a blood pressure of 200/120 in the setting of significant anxiety. She was given one dose of clonidine. She was started on small dose Toprol but has not started taking the medication. She reports not doing well in the past and amlodipine due to swelling. She is not a smoker. She reports family history of coronary artery disease as her father died at the age of 42 myocardial infarction. Her mother also had myocardial infarction in her 20s. She reports intermittent episodes of chest pain described as aching substernally with no radiation. This happens at rest and mostly when she is anxious. Typically this is not exertional. she reports mild exertional dyspnea.  Allergies  Allergen Reactions  .  Hydrochlorothiazide     cramps  . Morphine And Related Other (See Comments)    hallucinations  . Other Other (See Comments)    Other reaction(s): Other (See Comments) NARCOTICS=aggitation, hallucination Pt states allergic to "any narcotics"  . Pantoprazole Sodium Other (See Comments)    blisters  . Prednisone Other (See Comments)    Blisters on tongue     Current Outpatient Prescriptions on File Prior to Visit  Medication Sig Dispense Refill  . aspirin 81 MG chewable tablet Chew 81 mg by mouth daily.    . Cyanocobalamin (B-12 PO) Take 2 tablets by mouth daily.    . enalapril (VASOTEC) 20 MG tablet Take 20 mg by mouth daily.    Marland Kitchen FLUoxetine (PROZAC) 20 MG tablet Take 1 tablet (20 mg total) by mouth daily. 30 tablet 3  . furosemide (LASIX) 40 MG tablet Take 1 tablet (40 mg total) by mouth daily. 30 tablet 6  . LINZESS 290 MCG CAPS capsule Take 290 mcg by mouth daily.   0  . Probiotic Product (PROBIOTIC PO) Take 1 capsule by mouth daily.    . metoprolol succinate (TOPROL-XL) 25 MG 24 hr tablet Take 1 tablet (25 mg total) by mouth daily. (Patient not taking: Reported on 11/19/2015) 90 tablet 3   No current facility-administered medications on file prior to visit.     Past Medical History  Diagnosis Date  . Hypertension   . Edema   . Pain in limb   . Back pain   . Asthma   . MVP (mitral valve prolapse) 12/22/2011  . S/P dilatation of esophageal stricture 12/22/2011  . Positive ANA (antinuclear antibody) 01/29/2015  . Lupus ([place])   .  Diastolic heart failure ([place])   . IBS (irritable bowel syndrome)   . Anxiety   . Syncopal episodes   . TIA (transient ischemic attack)   . Chronic pain      Past Surgical History  Procedure Laterality Date  . Abdominal hysterectomy    . Cardiac catheterization    . Tonsillectomy    . Spine surgery       Family History  Problem Relation Age of Onset  . Diabetes Mother   . Hypertension Mother   . Heart disease Mother   . Diabetes  Father   . Hypertension Father   . COPD Father   . Heart attack Father 44  . Stroke Other   . Coronary artery disease Other   . Arthritis/Rheumatoid Mother   . Emphysema Father      Social History   Social History  . Marital Status: Legally Separated    Spouse Name: N/A  . Number of Children: 3  . Years of Education: 18   Occupational History  . Student      counseling   Social History Main Topics  . Smoking status: Never Smoker   . Smokeless tobacco: Never Used  . Alcohol Use: No  . Drug Use: No  . Sexual Activity: Not on file   Other Topics Concern  . Not on file   Social History Narrative   Born and raised in [place], [place]. Currently resides in an apartment with her daughter. No pets. Fun: singing/vocalist;   Denies religious beliefs that would effect health care.       Caffeine use: 1 cup every morning     ROS A 10 point review of system was performed. It is negative other than that mentioned in the history of present illness.   PHYSICAL EXAM   BP 140/90 mmHg  Pulse 80  Ht 5\' 7"  (1.702 m)  Wt 215 lb 12.8 oz (97.886 kg)  BMI 33.79 kg/m2 Constitutional: She is oriented to person, place, and time. She appears well-developed and well-nourished. No distress.  HENT: No nasal discharge.  Head: Normocephalic and atraumatic.  Eyes: Pupils are equal and round. No discharge.  Neck: Normal range of motion. Neck supple. No JVD present. No thyromegaly present.  Cardiovascular: Normal rate, regular rhythm, normal heart sounds. Exam reveals no gallop and no friction rub. No murmur heard.  Pulmonary/Chest: Effort normal and breath sounds normal. No stridor. No respiratory distress. She has no wheezes. She has no rales. She exhibits no tenderness.  Abdominal: Soft. Bowel sounds are normal. She exhibits no distension. There is no tenderness. There is no rebound and no guarding.  Musculoskeletal: Normal range of motion. She exhibits no edema and no tenderness.    Neurological: She is alert and oriented to person, place, and time. Coordination normal.  Skin: Skin is warm and dry. No rash noted. She is not diaphoretic. No erythema. No pallor.  Psychiatric: She has a normal mood and affect. Her behavior is normal. Judgment and thought content normal.     Recent EKG was reviewed which showed normal sinus rhythm with no significant ST or T wave changes.   ASSESSMENT AND PLAN

## 2015-11-21 NOTE — ED Notes (Addendum)
Pt states that she has been feeling like she was going to pass out all day and knew her BP was high. Also c/o L shoulder pain. Alert and oriented. Neuro intact.

## 2015-11-22 ENCOUNTER — Other Ambulatory Visit: Payer: Self-pay | Admitting: Adult Health

## 2015-11-22 ENCOUNTER — Encounter (HOSPITAL_COMMUNITY): Payer: Self-pay | Admitting: Emergency Medicine

## 2015-11-22 DIAGNOSIS — R232 Flushing: Secondary | ICD-10-CM

## 2015-11-22 LAB — URINALYSIS, ROUTINE W REFLEX MICROSCOPIC
Bilirubin Urine: NEGATIVE
Glucose, UA: NEGATIVE mg/dL
Hgb urine dipstick: NEGATIVE
Ketones, ur: NEGATIVE mg/dL
LEUKOCYTES UA: NEGATIVE
NITRITE: NEGATIVE
PROTEIN: NEGATIVE mg/dL
SPECIFIC GRAVITY, URINE: 1.014 (ref 1.005–1.030)
pH: 7 (ref 5.0–8.0)

## 2015-11-22 LAB — CBC
HCT: 37.1 % (ref 36.0–46.0)
Hemoglobin: 12.2 g/dL (ref 12.0–15.0)
MCH: 29.8 pg (ref 26.0–34.0)
MCHC: 32.9 g/dL (ref 30.0–36.0)
MCV: 90.5 fL (ref 78.0–100.0)
PLATELETS: 177 10*3/uL (ref 150–400)
RBC: 4.1 MIL/uL (ref 3.87–5.11)
RDW: 12.9 % (ref 11.5–15.5)
WBC: 6.9 10*3/uL (ref 4.0–10.5)

## 2015-11-22 LAB — BASIC METABOLIC PANEL
Anion gap: 7 (ref 5–15)
BUN: 15 mg/dL (ref 6–20)
CALCIUM: 9.5 mg/dL (ref 8.9–10.3)
CO2: 29 mmol/L (ref 22–32)
CREATININE: 0.86 mg/dL (ref 0.44–1.00)
Chloride: 106 mmol/L (ref 101–111)
Glucose, Bld: 106 mg/dL — ABNORMAL HIGH (ref 65–99)
Potassium: 4.1 mmol/L (ref 3.5–5.1)
SODIUM: 142 mmol/L (ref 135–145)

## 2015-11-22 LAB — I-STAT TROPONIN, ED: TROPONIN I, POC: 0.01 ng/mL (ref 0.00–0.08)

## 2015-11-22 MED ORDER — LORAZEPAM 1 MG PO TABS: 1.0000 mg | ORAL_TABLET | Freq: Three times a day (TID) | ORAL | Status: DC | PRN

## 2015-11-22 MED ORDER — LORAZEPAM 1 MG PO TABS
1.0000 mg | ORAL_TABLET | Freq: Once | ORAL | Status: AC
  Administered 2015-11-22: 1 mg via ORAL
  Filled 2015-11-22: qty 1

## 2015-11-22 NOTE — Discharge Instructions (Signed)
Hypertension Hypertension, commonly called high blood pressure, is when the force of blood pumping through your arteries is too strong. Your arteries are the blood vessels that carry blood from your heart throughout your body. A blood pressure reading consists of a higher number over a lower number, such as 110/72. The higher number (systolic) is the pressure inside your arteries when your heart pumps. The lower number (diastolic) is the pressure inside your arteries when your heart relaxes. Ideally you want your blood pressure below 120/80. Hypertension forces your heart to work harder to pump blood. Your arteries may become narrow or stiff. Having untreated or uncontrolled hypertension can cause heart attack, stroke, kidney disease, and other problems. RISK FACTORS Some risk factors for high blood pressure are controllable. Others are not.  Risk factors you cannot control include:   Race. You may be at higher risk if you are African American.  Age. Risk increases with age.  Gender. Men are at higher risk than women before age 45 years. After age 65, women are at higher risk than men. Risk factors you can control include:  Not getting enough exercise or physical activity.  Being overweight.  Getting too much fat, sugar, calories, or salt in your diet.  Drinking too much alcohol. SIGNS AND SYMPTOMS Hypertension does not usually cause signs or symptoms. Extremely high blood pressure (hypertensive crisis) may cause headache, anxiety, shortness of breath, and nosebleed. DIAGNOSIS To check if you have hypertension, your health care provider will measure your blood pressure while you are seated, with your arm held at the level of your heart. It should be measured at least twice using the same arm. Certain conditions can cause a difference in blood pressure between your right and left arms. A blood pressure reading that is higher than normal on one occasion does not mean that you need treatment. If  it is not clear whether you have high blood pressure, you may be asked to return on a different day to have your blood pressure checked again. Or, you may be asked to monitor your blood pressure at home for 1 or more weeks. TREATMENT Treating high blood pressure includes making lifestyle changes and possibly taking medicine. Living a healthy lifestyle can help lower high blood pressure. You may need to change some of your habits. Lifestyle changes may include:  Following the DASH diet. This diet is high in fruits, vegetables, and whole grains. It is low in salt, red meat, and added sugars.  Keep your sodium intake below 2,300 mg per day.  Getting at least 30-45 minutes of aerobic exercise at least 4 times per week.  Losing weight if necessary.  Not smoking.  Limiting alcoholic beverages.  Learning ways to reduce stress. Your health care provider may prescribe medicine if lifestyle changes are not enough to get your blood pressure under control, and if one of the following is true:  You are 18-59 years of age and your systolic blood pressure is above 140.  You are 60 years of age or older, and your systolic blood pressure is above 150.  Your diastolic blood pressure is above 90.  You have diabetes, and your systolic blood pressure is over 140 or your diastolic blood pressure is over 90.  You have kidney disease and your blood pressure is above 140/90.  You have heart disease and your blood pressure is above 140/90. Your personal target blood pressure may vary depending on your medical conditions, your age, and other factors. HOME CARE INSTRUCTIONS    Have your blood pressure rechecked as directed by your health care provider.   Take medicines only as directed by your health care provider. Follow the directions carefully. Blood pressure medicines must be taken as prescribed. The medicine does not work as well when you skip doses. Skipping doses also puts you at risk for  problems.  Do not smoke.   Monitor your blood pressure at home as directed by your health care provider. SEEK MEDICAL CARE IF:   You think you are having a reaction to medicines taken.  You have recurrent headaches or feel dizzy.  You have swelling in your ankles.  You have trouble with your vision. SEEK IMMEDIATE MEDICAL CARE IF:  You develop a severe headache or confusion.  You have unusual weakness, numbness, or feel faint.  You have severe chest or abdominal pain.  You vomit repeatedly.  You have trouble breathing. MAKE SURE YOU:   Understand these instructions.  Will watch your condition.  Will get help right away if you are not doing well or get worse.   This information is not intended to replace advice given to you by your health care provider. Make sure you discuss any questions you have with your health care provider.   Document Released: 10/19/2005 Document Revised: 03/05/2015 Document Reviewed: 08/11/2013 Elsevier Interactive Patient Education 2016 Elsevier Inc.  

## 2015-11-22 NOTE — ED Provider Notes (Signed)
CSN: [id number]     Arrival date & time 11/21/15  2346 History  By signing my name below, I, Meriel Pica, attest that this documentation has been prepared under the direction and in the presence of Montine Circle, PA-C. Electronically Signed: Meriel Pica, ED Scribe. 11/22/2015. 12:29 AM.  Chief Complaint  Patient presents with  . Hypertension   The history is provided by the patient. No language interpreter was used.   HPI Comments: [name] is a 55 y.o. female, with a PMhx of HTN, who presents to the Emergency Department for chief complaint of hypertension that has been waxing and waning throughout the day today, but has been elevated for ~ 6-7 months. Pt states throughout the day she has taking her BP which has been reading at [id number] diastolic. The pt was seen by her cardiologist yesterday and started on Motoprolol which she takes in addition to enalapril. The pt denies missing any recent doses of HTN medications. She associates a current mild headache and describes the pre-syncopal feeling 'like I am going to fall out' and light-headedness that has been intermittent throughout the day. She denies chest pain or abdominal pain. PCP with [facility].   Past Medical History  Diagnosis Date  . Hypertension   . Edema   . Pain in limb   . Back pain   . Asthma   . MVP (mitral valve prolapse) 12/22/2011    not confirmed on Echo  . S/P dilatation of esophageal stricture 12/22/2011  . Positive ANA (antinuclear antibody) 01/29/2015  . Lupus ([place])   . Diastolic heart failure ([place])   . IBS (irritable bowel syndrome)   . Anxiety   . Syncopal episodes   . TIA (transient ischemic attack)   . Chronic pain   . Fibromyalgia    Past Surgical History  Procedure Laterality Date  . Abdominal hysterectomy    . Cardiac catheterization    . Tonsillectomy    . Spine surgery    . Cardiac catheterization  07/2014    normal coronary arteries [facility])   Family History  Problem Relation Age of  Onset  . Diabetes Mother   . Hypertension Mother   . Heart disease Mother   . Diabetes Father   . Hypertension Father   . COPD Father   . Heart attack Father 46  . Stroke Other   . Coronary artery disease Other   . Arthritis/Rheumatoid Mother   . Emphysema Father    Social History  Substance Use Topics  . Smoking status: Never Smoker   . Smokeless tobacco: Never Used  . Alcohol Use: No   OB History    No data available     Review of Systems  Cardiovascular: Negative for chest pain.  Gastrointestinal: Negative for abdominal pain.  Neurological: Positive for light-headedness and headaches.  All other systems reviewed and are negative.  Allergies  Hydrochlorothiazide; Morphine and related; Other; Pantoprazole sodium; and Prednisone  Home Medications   Prior to Admission medications   Medication Sig Start Date End Date Taking? Authorizing Provider  aspirin 81 MG chewable tablet Chew 81 mg by mouth daily.    Historical Provider, MD  Cyanocobalamin (B-12 PO) Take 2 tablets by mouth daily.    Historical Provider, MD  enalapril (VASOTEC) 20 MG tablet Take 20 mg by mouth daily.    Historical Provider, MD  FLUoxetine (PROZAC) 20 MG tablet Take 1 tablet (20 mg total) by mouth daily. 11/18/15   Dorothyann Peng, NP  furosemide (LASIX)  40 MG tablet Take 1 tablet (40 mg total) by mouth daily. 10/23/15   Dorothyann Peng, NP  LINZESS 290 MCG CAPS capsule Take 290 mcg by mouth daily.  10/16/15   Historical Provider, MD  metoprolol succinate (TOPROL-XL) 25 MG 24 hr tablet Take 1 tablet (25 mg total) by mouth daily. Patient not taking: Reported on 11/19/2015 11/18/15   Dorothyann Peng, NP  Probiotic Product (PROBIOTIC PO) Take 1 capsule by mouth daily.    Historical Provider, MD   BP 220/108 mmHg  Pulse 58  Temp(Src) 97.4 F (36.3 C) (Oral)  Resp 20  SpO2 100% Physical Exam  Constitutional: She is oriented to person, place, and time. She appears well-developed and well-nourished.  HENT:   Head: Normocephalic and atraumatic.  Eyes: EOM are normal. Pupils are equal, round, and reactive to light.  Neck: Neck supple.  Cardiovascular: Normal rate, regular rhythm and normal heart sounds.   Pulmonary/Chest: Effort normal and breath sounds normal. No respiratory distress. She has no wheezes. She has no rales. She exhibits no tenderness.  Abdominal: Soft. Bowel sounds are normal. She exhibits no distension and no mass. There is no tenderness. There is no rebound and no guarding.  Musculoskeletal: Normal range of motion. She exhibits no edema.  Neurological: She is alert and oriented to person, place, and time. No cranial nerve deficit.  Skin: Skin is warm and dry.  Psychiatric: She has a normal mood and affect.  Nursing note and vitals reviewed.   ED Course  Procedures  DIAGNOSTIC STUDIES: Oxygen Saturation is 100% on RA, normal by my interpretation.    COORDINATION OF CARE: 12:23 AM Discussed treatment plan with pt at bedside and pt agreed to plan.   Labs Review Labs Reviewed  CBC  BASIC METABOLIC PANEL  URINALYSIS, ROUTINE W REFLEX MICROSCOPIC (NOT AT [facility])    Imaging Review No results found. I have personally reviewed and evaluated these images and lab results as part of my medical decision-making.  ED ECG REPORT  I personally interpreted this EKG   Date: 11/22/2015   Rate: 57  Rhythm: normal sinus rhythm  QRS Axis: normal  Intervals: normal  ST/T Wave abnormalities: normal  Conduction Disutrbances:none  Narrative Interpretation:   Old EKG Reviewed: unchanged    MDM   Final diagnoses:  Essential hypertension  Anxiety    Patient with hypertension.  See Yesterday at Cardiology office and put on metoprolol.  Today has felt dizzy and very anxious. Cardiology note states that much of this seems to be anxiety driven.  This is consistent with patient's presentation tonight.  Recent normal TSH and cortisol levels.  Patient is getting pheo workup, but per  cardiology, "secondary source of HTN no likely."  Will check labs.  BP has been high for quite some time.  Will give ativan for anxiety.  Discussed with Dr. Thurnell Garbe, who recommends treating anxiety first, and then reassessing.  Will give clonidine if symptoms do not improve.  Will check labs for any evidence of end organ damage.  EKG not crossing in MUSE, but per Dr. Thurnell Garbe, no changes from prior.  2:28 AM Filed Vitals:   11/22/15 0042 11/22/15 0108  BP: 185/102 157/93  Pulse: 57 58  Temp:    Resp: 16 17    Pressure has improved significantly. Patient feels much less anxious. She states that she feels improved, but still wants to know why her blood pressure was high in the first place. I have referred her back to her cardiologist  and primary care provider.  I personally performed the services described in this documentation, which was scribed in my presence. The recorded information has been reviewed and is accurate.      Montine Circle, PA-C 11/22/15 [place], DO 11/24/15 [id number]

## 2015-11-22 NOTE — Telephone Encounter (Signed)
Referral placed. She cannot split prozac in half. It is noted in her chart that she has asthma.

## 2015-11-22 NOTE — ED Notes (Signed)
PT DISCHARGED. INSTRUCTIONS AND PRESCRIPTION GIVEN. AAOX3. PT IN NO APPARENT DISTRESS. THE OPPORTUNITY TO ASK QUESTIONS WAS PROVIDED. 

## 2015-11-22 NOTE — Telephone Encounter (Signed)
Called and spoke with pt and advised of recommendations.  Pt states she went to the ED last night her heart rate was low and her blood pressure was high.  Pt was given Ativan and feels that has helped.  Pt has not started the Prozac because she wanted to see how the Metoprolol would do first.  Pt states this medicaiton made her feel dizzy.  Please advise if there are any other recommendations. Pt did turn in her 24 hour urine specimen.

## 2015-11-24 NOTE — Telephone Encounter (Signed)
It would probably be a good idea for her to see someone such as a therapist so that they can help her with her anxiety. We can provide her with a list if she would like.

## 2015-11-25 LAB — CATECHOLAMINES, FRACTIONATED, URINE, 24 HOUR
CALCULATED TOTAL (E+ NE): 36 ug/(24.h) (ref 26–121)
Creatinine, Urine mg/day-CATEUR: 1.33 g/(24.h) (ref 0.63–2.50)
Dopamine, 24 hr Urine: 232 mcg/24 h (ref 52–480)
NOREPINEPHRINE, 24 HR UR: 36 ug/(24.h) (ref 15–100)
Total Volume - CF 24Hr U: 2100 mL

## 2015-11-25 NOTE — Telephone Encounter (Signed)
Called and spoke with pt and pt is aware.  Pt states she has still been taking the metoprolol and has not started the prozac yet.  Pt states she is still a little dazed but has not taken her bp today.  Advised pt to continue to monitor her bp and call the office if her bp elevates more than normal.  Pt verbalized understanding. List of psychiatrist mailed to pt's home address.

## 2015-11-27 ENCOUNTER — Telehealth (HOSPITAL_COMMUNITY): Payer: Self-pay | Admitting: *Deleted

## 2015-11-27 NOTE — Telephone Encounter (Signed)
Patient given detailed instructions per Myocardial Perfusion Study Information Sheet for the test on 12/02/15 at 730. Patient notified to arrive 15 minutes early and that it is imperative to arrive on time for appointment to keep from having the test rescheduled.  If you need to cancel or reschedule your appointment, please call the office within 24 hours of your appointment. Failure to do so may result in a cancellation of your appointment, and a $50 no show fee. Patient verbalized understanding.Hubbard Robinson, RN

## 2015-11-28 LAB — METANEPHRINES, URINE, 24 HOUR
METANEPHRINES UR: 59 ug/(24.h) — AB (ref 90–315)
Metaneph Total, Ur: 310 mcg/24 h (ref 224–832)
Normetanephrine, 24H Ur: 251 mcg/24 h (ref 122–676)

## 2015-12-02 ENCOUNTER — Other Ambulatory Visit: Payer: Self-pay | Admitting: Adult Health

## 2015-12-02 ENCOUNTER — Ambulatory Visit (HOSPITAL_COMMUNITY): Payer: BLUE CROSS/BLUE SHIELD | Attending: Cardiovascular Disease

## 2015-12-02 ENCOUNTER — Telehealth: Payer: Self-pay

## 2015-12-02 ENCOUNTER — Ambulatory Visit: Payer: BLUE CROSS/BLUE SHIELD | Admitting: Adult Health

## 2015-12-02 DIAGNOSIS — R0789 Other chest pain: Secondary | ICD-10-CM

## 2015-12-02 DIAGNOSIS — I10 Essential (primary) hypertension: Secondary | ICD-10-CM | POA: Diagnosis not present

## 2015-12-02 DIAGNOSIS — R0602 Shortness of breath: Secondary | ICD-10-CM | POA: Diagnosis not present

## 2015-12-02 DIAGNOSIS — Z8673 Personal history of transient ischemic attack (TIA), and cerebral infarction without residual deficits: Secondary | ICD-10-CM | POA: Insufficient documentation

## 2015-12-02 LAB — MYOCARDIAL PERFUSION IMAGING
CSEPPHR: 117 {beats}/min
LHR: 0.27
LV dias vol: 111 mL
LVSYSVOL: 36 mL
Rest HR: 53 {beats}/min
SDS: 5
SRS: 0
SSS: 5
TID: 1.09

## 2015-12-02 MED ORDER — LORAZEPAM 1 MG PO TABS: 1.0000 mg | ORAL_TABLET | Freq: Two times a day (BID) | ORAL | Status: DC | PRN

## 2015-12-02 MED ORDER — LORAZEPAM 1 MG PO TABS: 1.0000 mg | ORAL_TABLET | Freq: Two times a day (BID) | ORAL | Status: DC

## 2015-12-02 MED ORDER — TECHNETIUM TC 99M SESTAMIBI GENERIC - CARDIOLITE
11.0000 | Freq: Once | INTRAVENOUS | Status: AC | PRN
  Administered 2015-12-02: 11 via INTRAVENOUS

## 2015-12-02 MED ORDER — REGADENOSON 0.4 MG/5ML IV SOLN
0.4000 mg | Freq: Once | INTRAVENOUS | Status: AC
  Administered 2015-12-02: 0.4 mg via INTRAVENOUS

## 2015-12-02 MED ORDER — ENALAPRIL MALEATE 20 MG PO TABS: 40.0000 mg | ORAL_TABLET | Freq: Every day | ORAL | Status: DC

## 2015-12-02 MED ORDER — TECHNETIUM TC 99M SESTAMIBI GENERIC - CARDIOLITE
30.8000 | Freq: Once | INTRAVENOUS | Status: AC | PRN
  Administered 2015-12-02: 30.8 via INTRAVENOUS

## 2015-12-02 NOTE — Telephone Encounter (Signed)
Called and spoke with pt and pt is aware recommendations.  Advised that pt stop metoprolol and increase Vasotec.  Pt is aware Ativan will be called in to the pharmacy.

## 2015-12-02 NOTE — Telephone Encounter (Signed)
She can go to up 40 mg on prescribed Vasotec. I have sent in a new prescription for this as well  I will write her for thirty days of Ativan. If she has facial swelling she can take a Benadryl to see if that helps. If she has trouble breathing or feeling like her throat is closing then she needs to go to the ER

## 2015-12-02 NOTE — Telephone Encounter (Signed)
Called pt about her lab results.  Pt states she went for her stress test today and they had to use an IV on her. Pt states her blood pressure is 180/108 and she has been experiencing headaches and dizziness. Advised pt that if her symptoms worsen to call 911 immediately.  Pt states she has been taking her metoprolol but now she realizes that years ago the medication caused facial swelling and eye swelling.  Pt states she does have facial swelling today.  Pls advise.

## 2015-12-04 ENCOUNTER — Ambulatory Visit (INDEPENDENT_AMBULATORY_CARE_PROVIDER_SITE_OTHER): Payer: Self-pay | Admitting: Neurology

## 2015-12-04 ENCOUNTER — Ambulatory Visit (INDEPENDENT_AMBULATORY_CARE_PROVIDER_SITE_OTHER): Payer: BLUE CROSS/BLUE SHIELD | Admitting: Neurology

## 2015-12-04 DIAGNOSIS — G5601 Carpal tunnel syndrome, right upper limb: Secondary | ICD-10-CM | POA: Diagnosis not present

## 2015-12-04 DIAGNOSIS — G5602 Carpal tunnel syndrome, left upper limb: Secondary | ICD-10-CM

## 2015-12-04 DIAGNOSIS — R202 Paresthesia of skin: Secondary | ICD-10-CM | POA: Diagnosis not present

## 2015-12-04 DIAGNOSIS — G5603 Carpal tunnel syndrome, bilateral upper limbs: Secondary | ICD-10-CM

## 2015-12-04 DIAGNOSIS — M5417 Radiculopathy, lumbosacral region: Secondary | ICD-10-CM

## 2015-12-04 DIAGNOSIS — Z0289 Encounter for other administrative examinations: Secondary | ICD-10-CM

## 2015-12-05 DIAGNOSIS — G5600 Carpal tunnel syndrome, unspecified upper limb: Secondary | ICD-10-CM | POA: Insufficient documentation

## 2015-12-05 DIAGNOSIS — M5417 Radiculopathy, lumbosacral region: Secondary | ICD-10-CM | POA: Insufficient documentation

## 2015-12-05 NOTE — Progress Notes (Signed)
  [facility]    Provider:  Dr Jaynee Eagles Referring Provider: Dorothyann Peng, NP Primary Care Physician:  Dorothyann Peng, NP  HPI:  [name] is a 55 y.o. female here for evaluation of numbness in the fingers, low back pain with radiculopathy.  Summary:   Nerve Conduction Studies were performed on the bilateral upper and lower extremities.  The right median APB motor nerve showed prolonged distal onset latency (5.4 ms, N<4.0). The right Median 2nd Digit sensory nerve showed prolonged distal peak latency (4.7 ms, N<3.9). F Wave studies indicate that the right Median F wave has normal latency.  The left median APB motor nerve showed prolonged distal onset latency (6.8 ms, N<4.0). The left Median 2nd Digit sensory nerve showed prolonged distal peak latency (4.8 ms, N<3.9).  F Wave studies indicate that the left Median F wave has delayed latency(35.7, N<76ms).  Bilateral Ulnar ADM motor nerves were within normal limits. F Wave studies indicate that the bilateral Ulnar F waves have normal latencies The bilateralUlnar 5th digit sensory nerves were within normal limits.  Bilateral Peroneal motor nerves showed normal conductions with normal F Wave latencies Bilateral Tibial motor nerves showed normal conductions with normal F Wave latencies Bilateral Sural sensory nerve conductions were within normal limits Bilateral H Reflexes showed normal latencies  EMG needle study of selected bilateral upper extremity muscles was performed. The following muscles were normal: Deltoid, Triceps, Pronator Teres, Opponens Pollicis, First Dorsal Interosseous. Could not perform EMG on lower extremities due to patient's pain level on needle study.  Conclusion: This is an abnormal study. There is electrophysiologic evidence of moderately-severe left > right Carpal Tunnel Syndrome. No suggestion of polyneuropathy or radiculopathy. Clinical correlation recommended.   Sarina Ill,  MD  [facility] [address] [place], [place] [address]  Phone [phone] Fax [phone]

## 2015-12-05 NOTE — Procedures (Signed)
  [facility]    Provider:  Dr Jaynee Eagles Referring Provider: Dorothyann Peng, NP Primary Care Physician:  Dorothyann Peng, NP  HPI:  [name] is a 55 y.o. female here for evaluation of numbness in the fingers, low back pain with radiculopathy.  Summary:   Nerve Conduction Studies were performed on the bilateral upper and lower extremities.  The right median APB motor nerve showed prolonged distal onset latency (5.4 ms, N<4.0). The right Median 2nd Digit sensory nerve showed prolonged distal peak latency (4.7 ms, N<3.9). F Wave studies indicate that the right Median F wave has normal latency.  The left median APB motor nerve showed prolonged distal onset latency (6.8 ms, N<4.0). The left Median 2nd Digit sensory nerve showed prolonged distal peak latency (4.8 ms, N<3.9).  F Wave studies indicate that the left Median F wave has delayed latency(35.7, N<60ms).  Bilateral Ulnar ADM motor nerves were within normal limits. F Wave studies indicate that the bilateral Ulnar F waves have normal latencies The bilateralUlnar 5th digit sensory nerves were within normal limits.  Bilateral Peroneal motor nerves showed normal conductions with normal F Wave latencies Bilateral Tibial motor nerves showed normal conductions with normal F Wave latencies Bilateral Sural sensory nerve conductions were within normal limits Bilateral H Reflexes showed normal latencies  EMG needle study of selected bilateral upper extremity muscles was performed. The following muscles were normal: Deltoid, Triceps, Pronator Teres, Opponens Pollicis, First Dorsal Interosseous. Could not perform EMG on lower extremities due to patient's pain level on needle study.  Conclusion: This is an abnormal study. There is electrophysiologic evidence of moderately-severe left > right Carpal Tunnel Syndrome. No suggestion of polyneuropathy or radiculopathy. Clinical correlation recommended.   Sarina Ill,  MD  [facility] [address] [place], [place] [address]  Phone [phone] Fax [phone]

## 2015-12-05 NOTE — Progress Notes (Signed)
See procedure note.

## 2015-12-19 NOTE — Progress Notes (Signed)
Received update from [facility] that study was completed and loaded into their server. They will contact our office within 48 hours to let us know report generated and ready for review by Dr Junius Argyle.

## 2015-12-24 NOTE — Progress Notes (Signed)
Received 72-hr EEG results from [facility]. Results given to Dr Jaynee Eagles for review.

## 2016-01-05 ENCOUNTER — Telehealth: Payer: Self-pay | Admitting: Neurology

## 2016-01-05 NOTE — Telephone Encounter (Signed)
Let patient know prolonged EEG was normal. thanks

## 2016-01-06 NOTE — Telephone Encounter (Signed)
Spoke to pt about normal prolonged EEG. Pt verbalized understanding.

## 2016-01-07 ENCOUNTER — Ambulatory Visit (INDEPENDENT_AMBULATORY_CARE_PROVIDER_SITE_OTHER): Payer: BLUE CROSS/BLUE SHIELD | Admitting: Adult Health

## 2016-01-07 ENCOUNTER — Encounter: Payer: BLUE CROSS/BLUE SHIELD | Attending: Physical Medicine & Rehabilitation

## 2016-01-07 ENCOUNTER — Ambulatory Visit (HOSPITAL_BASED_OUTPATIENT_CLINIC_OR_DEPARTMENT_OTHER): Payer: BLUE CROSS/BLUE SHIELD | Admitting: Physical Medicine & Rehabilitation

## 2016-01-07 ENCOUNTER — Encounter: Payer: Self-pay | Admitting: Adult Health

## 2016-01-07 ENCOUNTER — Encounter: Payer: Self-pay | Admitting: Physical Medicine & Rehabilitation

## 2016-01-07 VITALS — BP 143/95 | HR 60 | Resp 14

## 2016-01-07 VITALS — BP 143/100 | HR 76 | Temp 98.5°F | Ht 67.0 in | Wt 216.0 lb

## 2016-01-07 DIAGNOSIS — M329 Systemic lupus erythematosus, unspecified: Secondary | ICD-10-CM | POA: Insufficient documentation

## 2016-01-07 DIAGNOSIS — R42 Dizziness and giddiness: Secondary | ICD-10-CM | POA: Insufficient documentation

## 2016-01-07 DIAGNOSIS — R269 Unspecified abnormalities of gait and mobility: Secondary | ICD-10-CM | POA: Diagnosis not present

## 2016-01-07 DIAGNOSIS — M797 Fibromyalgia: Secondary | ICD-10-CM

## 2016-01-07 DIAGNOSIS — R531 Weakness: Secondary | ICD-10-CM | POA: Diagnosis not present

## 2016-01-07 DIAGNOSIS — Z8673 Personal history of transient ischemic attack (TIA), and cerebral infarction without residual deficits: Secondary | ICD-10-CM | POA: Diagnosis not present

## 2016-01-07 DIAGNOSIS — M791 Myalgia: Secondary | ICD-10-CM

## 2016-01-07 DIAGNOSIS — M79609 Pain in unspecified limb: Secondary | ICD-10-CM | POA: Insufficient documentation

## 2016-01-07 DIAGNOSIS — M545 Low back pain: Secondary | ICD-10-CM | POA: Insufficient documentation

## 2016-01-07 DIAGNOSIS — F419 Anxiety disorder, unspecified: Secondary | ICD-10-CM | POA: Insufficient documentation

## 2016-01-07 DIAGNOSIS — R410 Disorientation, unspecified: Secondary | ICD-10-CM | POA: Diagnosis not present

## 2016-01-07 DIAGNOSIS — F411 Generalized anxiety disorder: Secondary | ICD-10-CM | POA: Diagnosis not present

## 2016-01-07 DIAGNOSIS — I10 Essential (primary) hypertension: Secondary | ICD-10-CM | POA: Insufficient documentation

## 2016-01-07 DIAGNOSIS — M7918 Myalgia, other site: Secondary | ICD-10-CM

## 2016-01-07 DIAGNOSIS — K589 Irritable bowel syndrome without diarrhea: Secondary | ICD-10-CM | POA: Insufficient documentation

## 2016-01-07 DIAGNOSIS — G8929 Other chronic pain: Secondary | ICD-10-CM | POA: Insufficient documentation

## 2016-01-07 MED ORDER — PREGABALIN 75 MG PO CAPS: 75.0000 mg | ORAL_CAPSULE | Freq: Two times a day (BID) | ORAL | Status: DC

## 2016-01-07 MED ORDER — AMITRIPTYLINE HCL 25 MG PO TABS: 25.0000 mg | ORAL_TABLET | Freq: Every day | ORAL | Status: DC

## 2016-01-07 MED ORDER — PREGABALIN 25 MG PO CAPS: 75.0000 mg | ORAL_CAPSULE | Freq: Two times a day (BID) | ORAL | Status: DC

## 2016-01-07 NOTE — Progress Notes (Signed)
Subjective:    Patient ID: [name], female    DOB: [birth year], 55 y.o.   MRN: [id number]  HPI   55 year old female who presents to the office today for follow up regarding blood pressure and chronic pain. I last saw her on 11/18/2015 at which time her blood pressure was 200/210. She was prescribed Toprol-XL 25 mg. Three days later she went to the ER for the complaint of hypertension. She was given a dose of ativan and her blood pressure improved as her anxiety level went down.   She was seen by Dr. Letta Pate, with Physical Medicine prior to coming to this visit. She was prescribed Lyrica 25mg  for perceived fibromyalgia pain. She is not excited about going on Lyrica   During her visit in January, I had prescribed her Prozac and Elavil for neuropathic pain. She has not started prozac yet and does not remember getting a prescription for Elavil.   Her blood pressure is 143/100 today in the office. She continues to be anxious but is less anxious then previous visits.  She reports that she continues to have chronic pain in various sites of her body. Was diagnosed with Carpal Tunnel recently, but does not want to have the surgery.    Review of Systems  Constitutional: Positive for activity change and unexpected weight change.  Respiratory: Negative.   Cardiovascular: Negative.   Gastrointestinal: Negative.   Musculoskeletal: Positive for myalgias and arthralgias. Negative for back pain.  Neurological: Positive for tremors, weakness, light-headedness, numbness and headaches.  Psychiatric/Behavioral: Positive for sleep disturbance. Negative for suicidal ideas and self-injury. The patient is nervous/anxious.   All other systems reviewed and are negative.  Past Medical History  Diagnosis Date  . Hypertension   . Edema   . Pain in limb   . Back pain   . Asthma   . MVP (mitral valve prolapse) 12/22/2011    not confirmed on Echo 08/2015  . S/P dilatation of esophageal stricture 12/22/2011    . Positive ANA (antinuclear antibody) 01/29/2015  . Lupus ([place])   . Diastolic heart failure ([place])   . IBS (irritable bowel syndrome)   . Anxiety   . Syncopal episodes   . TIA (transient ischemic attack)   . Chronic pain   . Fibromyalgia   . Atypical chest pain     in the setting of anxiety    Social History   Social History  . Marital Status: Legally Separated    Spouse Name: N/A  . Number of Children: 3  . Years of Education: 18   Occupational History  . Student      counseling   Social History Main Topics  . Smoking status: Never Smoker   . Smokeless tobacco: Never Used  . Alcohol Use: No  . Drug Use: No  . Sexual Activity: Not on file   Other Topics Concern  . Not on file   Social History Narrative   Born and raised in [place], [place]. Currently resides in an apartment with her daughter. No pets. Fun: singing/vocalist;   Denies religious beliefs that would effect health care.       Caffeine use: 1 cup every morning    Past Surgical History  Procedure Laterality Date  . Abdominal hysterectomy    . Cardiac catheterization    . Tonsillectomy    . Spine surgery    . Cardiac catheterization  07/2014    normal coronary arteries [facility])    Family History  Problem Relation Age of Onset  . Diabetes Mother   . Hypertension Mother   . Heart disease Mother   . Diabetes Father   . Hypertension Father   . COPD Father   . Heart attack Father 67  . Stroke Other   . Coronary artery disease Other   . Arthritis/Rheumatoid Mother   . Emphysema Father     Allergies  Allergen Reactions  . Gabapentin Other (See Comments)    Caused stroke symptoms   . Hydrochlorothiazide     cramps  . Metoprolol Swelling  . Morphine And Related Other (See Comments)    hallucinations  . Other Other (See Comments)    Other reaction(s): Other (See Comments) NARCOTICS=aggitation, hallucination Pt states allergic to "any narcotics"  . Pantoprazole Sodium Other (See Comments)     blisters  . Prednisone Other (See Comments)    Blisters on tongue    Current Outpatient Prescriptions on File Prior to Visit  Medication Sig Dispense Refill  . aspirin 81 MG chewable tablet Chew 81 mg by mouth daily.    . Cyanocobalamin (B-12 PO) Take 2 tablets by mouth daily.    . enalapril (VASOTEC) 20 MG tablet Take 2 tablets (40 mg total) by mouth daily. 60 tablet 1  . furosemide (LASIX) 40 MG tablet Take 1 tablet (40 mg total) by mouth daily. 30 tablet 6  . LORazepam (ATIVAN) 1 MG tablet Take 1 tablet (1 mg total) by mouth 2 (two) times daily. 60 tablet 0  . pregabalin (LYRICA) 25 MG capsule Take 3 capsules (75 mg total) by mouth 2 (two) times daily. 60 capsule 1  . FLUoxetine (PROZAC) 20 MG tablet Take 1 tablet (20 mg total) by mouth daily. (Patient not taking: Reported on 01/07/2016) 30 tablet 3   No current facility-administered medications on file prior to visit.    BP 143/100 mmHg  Pulse 76  Temp(Src) 98.5 F (36.9 C) (Oral)  Ht 5\' 7"  (1.702 m)  Wt 216 lb (97.977 kg)  BMI 33.82 kg/m2  SpO2 96%       Objective:   Physical Exam  Constitutional: She is oriented to person, place, and time. She appears well-developed and well-nourished. No distress.  Cardiovascular: Normal rate, regular rhythm, normal heart sounds and intact distal pulses.  Exam reveals no gallop and no friction rub.   No murmur heard. Pulmonary/Chest: Effort normal and breath sounds normal. No respiratory distress. She has no wheezes. She has no rales. She exhibits no tenderness.  Musculoskeletal: Normal range of motion. She exhibits no edema or tenderness.  Neurological: She is alert and oriented to person, place, and time.  Skin: Skin is warm and dry. No rash noted. She is not diaphoretic. No erythema. No pallor.  Psychiatric: She has a normal mood and affect. Her behavior is normal. Thought content normal.  Nursing note and vitals reviewed.     Assessment & Plan:   1. Essential hypertension -  Becoming more well controlled. Not at goal yet. I think a large portion of his is anxiety induced.   2. Generalized anxiety disorder - amitriptyline (ELAVIL) 25 MG tablet; Take 1 tablet (25 mg total) by mouth at bedtime.  Dispense: 30 tablet; Refill: 1 - Take prescribed Prozac  3. Fibromyalgia - Take prescribed Prozac in the morning - amitriptyline (ELAVIL) 25 MG tablet; Take 1 tablet (25 mg total) by mouth at bedtime.  Dispense: 30 tablet; Refill: 1 - Follow up in one month

## 2016-01-07 NOTE — Progress Notes (Signed)
Subjective:    Patient ID: [name], female    DOB: 21-Jan-1961, 55 y.o.   MRN: [id number]  HPI 55 year old female with primary complaint of pain all over her body. She states that about 2 years ago she was normal and was able to exercise on a regular basis. She developed back pain and then underwent L5-S1 laminectomy, and "hasn't been the same since".Looking back at records her lumbar surgery was in 2012. She has had workup for her generalized complaints by rheumatology. Seen at [facility], positive ANA, started on a trial of plaquenil was noted. No improvement. Had a second opinion at Department of rheumatology [facility] 11/08/2015 with diagnosis of fibromyalgia syndrome. Advised to exercise, recommending gluten-free diet trial, SSRI or SNRI.Currently on Prozac 20 mg per day Has never tried Lyrica or Cymbalta.Allergy to Gabapentin  Patient is going to school , Working part-time as Consulting civil engineer. States that she has seen psychiatry in the past but none recently. Discussed elevated PHQ score  She states she's had facial rash and has showed me pictures of this however I could not appreciate any abnormalities. She also states that she's had swelling of her fingers, see exam below Pain Inventory Average Pain 8 Pain Right Now 8 My pain is tingling and aching  In the last 24 hours, has pain interfered with the following? General activity 6 Relation with others 7 Enjoyment of life 10 What TIME of day is your pain at its worst? morning, night Sleep (in general) Fair  Pain is worse with: walking, bending, sitting, inactivity and standing Pain improves with: heat/ice and therapy/exercise Relief from Meds: 4  Mobility how many minutes can you walk? 30-40 min  do you drive?  yes  Function employed # of hrs/week 20 hrs I need assistance with the following:  dressing, bathing and household  duties  Neuro/Psych weakness numbness tingling trouble walking spasms dizziness confusion depression anxiety  Prior Studies nerve study  Physicians involved in your care Primary care . Rheumatologist . Psychologist .   Family History  Problem Relation Age of Onset  . Diabetes Mother   . Hypertension Mother   . Heart disease Mother   . Diabetes Father   . Hypertension Father   . COPD Father   . Heart attack Father 1  . Stroke Other   . Coronary artery disease Other   . Arthritis/Rheumatoid Mother   . Emphysema Father    Social History   Social History  . Marital Status: Legally Separated    Spouse Name: N/A  . Number of Children: 3  . Years of Education: 18   Occupational History  . Student      counseling   Social History Main Topics  . Smoking status: Never Smoker   . Smokeless tobacco: Never Used  . Alcohol Use: No  . Drug Use: No  . Sexual Activity: Not Asked   Other Topics Concern  . None   Social History Narrative   Born and raised in [place], [place]. Currently resides in an apartment with her daughter. No pets. Fun: singing/vocalist;   Denies religious beliefs that would effect health care.       Caffeine use: 1 cup every morning   Past Surgical History  Procedure Laterality Date  . Abdominal hysterectomy    . Cardiac catheterization    . Tonsillectomy    . Spine surgery    . Cardiac catheterization  07/2014  normal coronary arteries [facility])   Past Medical History  Diagnosis Date  . Hypertension   . Edema   . Pain in limb   . Back pain   . Asthma   . MVP (mitral valve prolapse) 12/22/2011    not confirmed on Echo 08/2015  . S/P dilatation of esophageal stricture 12/22/2011  . Positive ANA (antinuclear antibody) 01/29/2015  . Lupus ([place])   . Diastolic heart failure ([place])   . IBS (irritable bowel syndrome)   . Anxiety   . Syncopal episodes   . TIA (transient ischemic attack)   . Chronic pain   . Fibromyalgia   . Atypical  chest pain     in the setting of anxiety   BP 143/95 mmHg  Pulse 60  Resp 14  SpO2 97%  Opioid Risk Score:   Fall Risk Score:  `1  Depression screen PHQ 2/9  Depression screen [facility] 2/9 01/07/2016 11/20/2014  Decreased Interest 3 3  Down, Depressed, Hopeless 1 3  PHQ - 2 Score 4 6  Altered sleeping 3 2  Tired, decreased energy 3 2  Change in appetite 0 0  Feeling bad or failure about yourself  1 2  Trouble concentrating 3 2  Moving slowly or fidgety/restless 3 0  Suicidal thoughts 0 1  PHQ-9 Score 17 15  Difficult doing work/chores Very difficult -     Review of Systems  Constitutional: Positive for diaphoresis and unexpected weight change.  Respiratory: Positive for cough, shortness of breath and wheezing.   Cardiovascular:       Limb swelling   Gastrointestinal: Positive for nausea, abdominal pain and constipation.  Musculoskeletal: Positive for gait problem.  Neurological: Positive for dizziness, weakness and numbness.       Tingling  Spasms   Psychiatric/Behavioral: Positive for confusion. The patient is nervous/anxious.   All other systems reviewed and are negative.      Objective:   Physical Exam  Constitutional: She appears well-developed and well-nourished.  HENT:  Head: Normocephalic and atraumatic.  Eyes: Conjunctivae and EOM are normal. Pupils are equal, round, and reactive to light.  Neck: Normal range of motion.  Cardiovascular: Normal rate and regular rhythm.   Pulmonary/Chest: Effort normal and breath sounds normal.  Abdominal: Soft. Bowel sounds are normal.  Psychiatric: Her mood appears anxious. Her speech is rapid and/or pressured. She exhibits a depressed mood.  Nursing note and vitals reviewed.   Neuro:  Eyes without evidence of nystagmus  Tone is normal without evidence of spasticity Cerebellar exam shows no evidence of ataxia on finger nose finger or heel to shin testing No evidence of trunkal ataxia  Motor strength is 5/5 in bilateral  deltoid, biceps, triceps, finger flexors and extensors, wrist flexors and extensors, hip flexors, knee flexors and extensors, ankle dorsiflexors, plantar flexors, invertors and evertors, toe flexors and extensors  Sensory exam is normal to pinprick, and light touch in the upper and lower limbs   Tender points bilateral traps, cervical thoracic and lumbar paraspinals Skin without evidence of malar rash Patient has no evidence of joint swelling in the hands wrist or feet. Lumbar spine range of motion is 50% flexion extension lateral bending and rotation No evidence of lumbar spine deformity Cervical range of motion 75% flexion-extension lateral rotation and bending.      Assessment & Plan:  1. Fibromyalgia syndrome with generalized pain this may be complicated by depression and anxiety.PHQ 17 She is questioning her fibromyalgia diagnosis [facility]  We discussed signs and symptoms of this and I have given her a print out. She may also have myofascial pain syndrome with some trigger points particularly in the trapezius area  Recommend psychiatry evaluation, trial of SN RI  Recommend trial of Lyrica 75 twice a day, patient states that she would like to try the smallest dose available. I rewrote it for 25 mg twice a day and this can be increased as needed. Effective dose may not be reached until we get to 450 mg per day assuming she tolerates this. We went over potential side effects including sedation and edema as most common although did emphasize that the majority of patients do not have any side effects.  [facility] her [facility]. Therefore will not make a referral to [facility] therapy  Nurse practitioner visit 2 months. Overall plan is to slowly increase on Lyrica, encourage increased physical activity. As per rheumatology may try a gluten-free diet, Would emphasize need for  psychiatry  Follow-up

## 2016-01-07 NOTE — Patient Instructions (Signed)
It was great seeing you again!  I promise you, we are moving in the right direction.   I have sent in a prescription for Amitriptyline, take this at night before you go to bed. You may wake up in the morning with drowsiness for the first few days but this will subside.   Use the Prozac during the day.   Follow up with me in one month.

## 2016-01-07 NOTE — Patient Instructions (Signed)
Myofascial Pain Syndrome and Fibromyalgia  Myofascial pain syndrome and fibromyalgia are both pain disorders. This pain may be felt mainly in your muscles.   · Myofascial pain syndrome:    Always has trigger points or tender points in the muscle that will cause pain when pressed. The pain may come and go.    Usually affects your neck, upper back, and shoulder areas. The pain often radiates into your arms and hands.  · Fibromyalgia:    Has muscle pains and tenderness that come and go.    Is often associated with fatigue and sleep disturbances.    Has trigger points.    Tends to be long-lasting (chronic), but is not life-threatening.  Fibromyalgia and myofascial pain are not the same. However, they often occur together. If you have both conditions, each can make the other worse. Both are common and can cause enough pain and fatigue to make day-to-day activities difficult.   CAUSES   The exact causes of fibromyalgia and myofascial pain are not known. People with certain gene types may be more likely to develop fibromyalgia. Some factors can be triggers for both conditions, such as:   · Spine disorders.  · Arthritis.  · Severe injury (trauma) and other physical stressors.  · Being under a lot of stress.  · A medical illness.  SIGNS AND SYMPTOMS   Fibromyalgia  The main symptom of fibromyalgia is widespread pain and tenderness in your muscles. This can vary over time. Pain is sometimes described as stabbing, shooting, or burning. You may have tingling or numbness, too. You may also have sleep problems and fatigue. You may wake up feeling tired and groggy (fibro fog). Other symptoms may include:   · Bowel and bladder problems.  · Headaches.  · Visual problems.  · Problems with odors and noises.  · Depression or mood changes.  · Painful menstrual periods (dysmenorrhea).  · Dry skin or eyes.  Myofascial pain syndrome  Symptoms of myofascial pain syndrome include:   · Tight, ropy bands of muscle.    · Uncomfortable  sensations in muscular areas, such as:    Aching.    Cramping.    Burning.    Numbness.    Tingling.      Muscle weakness.  · Trouble moving certain muscles freely (range of motion).  DIAGNOSIS   There are no specific tests to diagnose fibromyalgia or myofascial pain syndrome. Both can be hard to diagnose because their symptoms are common in many other conditions. Your health care provider may suspect one or both of these conditions based on your symptoms and medical history. Your health care provider will also do a physical exam.   The key to diagnosing fibromyalgia is having pain, fatigue, and other symptoms for more than three months that cannot be explained by another condition.   The key to diagnosing myofascial pain syndrome is finding trigger points in muscles that are tender and cause pain elsewhere in your body (referred pain).  TREATMENT   Treating fibromyalgia and myofascial pain often requires a team of health care providers. This usually starts with your primary provider and a physical therapist. You may also find it helpful to work with alternative health care providers, such as massage therapists or acupuncturists.  Treatment for fibromyalgia may include medicines. This may include nonsteroidal anti-inflammatory drugs (NSAIDs), along with other medicines.   Treatment for myofascial pain may also include:  · NSAIDs.  · Cooling and stretching of muscles.  · Trigger point injections.  ·   Sound wave (ultrasound) treatments to stimulate muscles.  HOME CARE INSTRUCTIONS   · Take medicines only as directed by your health care provider.  · Exercise as directed by your health care provider or physical therapist.  · Try to avoid stressful situations.  · Practice relaxation techniques to control your stress. You may want to try:    Biofeedback.    Visual imagery.    Hypnosis.    Muscle relaxation.    Yoga.    Meditation.  · Talk to your health care provider about alternative treatments, such as acupuncture or  massage treatment.  · Maintain a healthy lifestyle. This includes eating a healthy diet and getting enough sleep.  · Consider joining a support group.  · Do not do activities that stress or strain your muscles. That includes repetitive motions and heavy lifting.  SEEK MEDICAL CARE IF:   · You have new symptoms.  · Your symptoms get worse.  · You have side effects from your medicines.  · You have trouble sleeping.  · Your condition is causing depression or anxiety.  FOR MORE INFORMATION   · National Fibromyalgia Association: http://www.fmaware.orgwww.fmaware.org  · Arthritis Foundation: http://www.arthritis.orgwww.arthritis.org  · American Chronic Pain Association: http://www.theacpa.org/condition/myofascial-painwww.theacpa.org/condition/myofascial-pain     This information is not intended to replace advice given to you by your health care provider. Make sure you discuss any questions you have with your health care provider.     Document Released: 10/19/2005 Document Revised: 11/09/2014 Document Reviewed: 07/25/2014  Elsevier Interactive Patient Education ©2016 Elsevier Inc.

## 2016-01-07 NOTE — Progress Notes (Signed)
Pre visit review using our clinic review tool, if applicable. No additional management support is needed unless otherwise documented below in the visit note. 

## 2016-01-08 DIAGNOSIS — M7918 Myalgia, other site: Secondary | ICD-10-CM | POA: Insufficient documentation

## 2016-01-08 MED ORDER — PREGABALIN 25 MG PO CAPS: 25.0000 mg | ORAL_CAPSULE | Freq: Two times a day (BID) | ORAL | Status: DC

## 2016-01-10 ENCOUNTER — Encounter: Payer: Self-pay | Admitting: Neurology

## 2016-01-28 ENCOUNTER — Ambulatory Visit: Payer: BLUE CROSS/BLUE SHIELD | Attending: Physical Medicine & Rehabilitation

## 2016-01-28 ENCOUNTER — Telehealth: Payer: Self-pay | Admitting: Adult Health

## 2016-01-28 DIAGNOSIS — M5382 Other specified dorsopathies, cervical region: Secondary | ICD-10-CM | POA: Insufficient documentation

## 2016-01-28 DIAGNOSIS — M797 Fibromyalgia: Secondary | ICD-10-CM | POA: Insufficient documentation

## 2016-01-28 DIAGNOSIS — R252 Cramp and spasm: Secondary | ICD-10-CM

## 2016-01-28 DIAGNOSIS — M542 Cervicalgia: Secondary | ICD-10-CM | POA: Insufficient documentation

## 2016-01-28 DIAGNOSIS — M5020 Other cervical disc displacement, unspecified cervical region: Secondary | ICD-10-CM | POA: Diagnosis present

## 2016-01-28 DIAGNOSIS — R293 Abnormal posture: Secondary | ICD-10-CM

## 2016-01-28 NOTE — Therapy (Addendum)
[place], [place], [address] Phone: [phone]   Fax:  [phone]  Physical Therapy Evaluation  Patient Details  Name: [name] MRN: [id number] Date of Birth: 15-Jul-1961 Referring Provider: Laurell Roof   Encounter Date: 01/28/2016      PT End of Session - 01/28/16 1133    Visit Number 1   Number of Visits 16   Date for PT Re-Evaluation 03/24/16   PT Start Time 0810   PT Stop Time 0900  Increased evaluative time required    PT Time Calculation (min) 50 min   Activity Tolerance Patient tolerated treatment well   Behavior During Therapy [facility] for tasks assessed/performed      Past Medical History  Diagnosis Date  . Hypertension   . Edema   . Pain in limb   . Back pain   . Asthma   . MVP (mitral valve prolapse) 12/22/2011    not confirmed on Echo 08/2015  . S/P dilatation of esophageal stricture 12/22/2011  . Positive ANA (antinuclear antibody) 01/29/2015  . Lupus ([place])   . Diastolic heart failure ([place])   . IBS (irritable bowel syndrome)   . Anxiety   . Syncopal episodes   . TIA (transient ischemic attack)   . Chronic pain   . Fibromyalgia   . Atypical chest pain     in the setting of anxiety    Past Surgical History  Procedure Laterality Date  . Abdominal hysterectomy    . Cardiac catheterization    . Tonsillectomy    . Spine surgery    . Cardiac catheterization  07/2014    normal coronary arteries [facility])    There were no vitals filed for this visit.  Visit Diagnosis:  Neck pain  Fibromyalgia  Decreased ROM of intervertebral discs of cervical spine  Herniated cervical disc      Subjective Assessment - 01/28/16 0823    Subjective Pt has generalized pain throguhout the body, was diagnoed with fibromyalgia, but primary c/o is neck pain with looking dow, headaches, and pain into arms and numbness into first 3 fingers. Pt was also diagnosed with carpal tunnel.     Pertinent History HTN, Fibromyalgia, lumbar surgery, TIA, carpal tunnnel, "lupus", "passing-out spells,"   requires BP monitoring    Limitations Standing;Walking;Writing;Sitting;Reading;House hold activities   How long can you sit comfortably? 30   How long can you stand comfortably? 10   How long can you walk comfortably? 45 mins    Currently in Pain? Yes   Pain Score 8    Pain Location Neck   Pain Orientation Mid   Pain Descriptors / Indicators Cramping   Pain Radiating Towards bilat LE    Pain Onset More than a month ago   Pain Frequency Constant   Aggravating Factors  looking down, activity    Pain Relieving Factors heat, rest     Effect of Pain on Daily Activities read, household activities             [facility] PT Assessment - 01/28/16 0001    Assessment   Medical Diagnosis neck pain and fibromyalgia    Referring Provider Laurell Roof    Onset Date/Surgical Date 01/27/13   Hand Dominance Left   Next MD Visit unknown    Prior Therapy none   Precautions   Precautions None   Restrictions   Weight Bearing Restrictions No   Balance Screen   Has the patient fallen in the past  6 months Yes   How many times? [place] residence   Prior Function   Level of Independence Independent   Cognition   Overall Cognitive Status Within Functional Limits for tasks assessed   Observation/Other Assessments   Other Surveys  Other Surveys   Neck Disability Index  35  >35 Complete disabilty    ROM / Strength   AROM / PROM / Strength AROM;Strength   AROM   AROM Assessment Site Cervical   Cervical Flexion 14  end range pain   Cervical Extension 30   Cervical - Right Side Bend 20   Cervical - Left Side Bend 18  end range pain   Cervical - Right Rotation 50   Cervical - Left Rotation 35  end range pain    Strength   Overall Strength Comments Grip strength: R 22 lbs, L 20 lbs    Flexibility   Soft Tissue Assessment /Muscle Length --   Adverse neural tension median nerve   Palpation   Palpation comment TPs at bulat upper trap and levator scap.    Special Tests    Special Tests Cervical   Cervical Tests Spurling's;Dictraction   Spurling's   Findings Positive   Side --  bilat   Distraction Test   Findngs Positive   side --  decreased pain                    [facility] Adult PT Treatment/Exercise - 01/28/16 0001    Modalities   Modalities Moist Heat   Moist Heat Therapy   Number Minutes Moist Heat 10 Minutes  post eval    Moist Heat Location Cervical   Manual Therapy   Manual Therapy Manual Traction   Manual Traction 30 secs x 3  seated. Decreased pain                 PT Education - 01/28/16 1132    Education provided Yes   Education Details Postural education, PT POC   Person(s) Educated Patient   Methods Explanation   Comprehension Verbalized understanding          PT Short Term Goals - 01/28/16 1139    PT SHORT TERM GOAL #1   Title Pt will be I with initial HEP for continued strengthening and mobility by 02/28/16.     Time 4   Period Weeks   Status New   PT SHORT TERM GOAL #2   Title Pt will be able to demo posture as it related to upper body, shoulder, and cervical spine by 01/28/16.   Time 4   Period Weeks   Status New           PT Long Term Goals - 01/28/16 1140    PT LONG TERM GOAL #1   Title R Cervical rotation AROM will improve to 45 degrees pain-free in order to check blind spot while driving by [id number].    Time 8   Period Weeks   Status New   PT LONG TERM GOAL #2   Title Neck Disability Index will improve from 35 ( complete disability) to 20 for improved function by 03/23/16.   Time 8   Period Weeks   Status New   PT LONG TERM GOAL #3   Title Cervcal flexion will improve to 30 degrees in order for pt to read or work on the computer for 20 mins without pain by 03/23/16.   Time 8  Period Weeks   Status New        Plan - 02/11/16 [id number]    Clinical  Impression Statement Pt presents for high complexity evaluation  for neck pain and generalized body pain. Pt has fibromyalgia. Signs and  symptoms are compatible with cervical DDD, muscle strain, and postural  syndrome. Pt has hx of "lumbar sx" in 2014. Pt presents with impairments  including pain, impaired mobility/ROM, and impaired strength, which limit  pt's functional abilities with reaching, lifting, carrying, dressing,  grooming, reading.  Pt will benefit from oupt PT for 2 times a week for 8  weeks for postural education, strengthening, stretching, pt education in  order to address these impairments and functional limitations and return  pt to pain-free PLOF.   Rehab Potential Fair   Clinical Impairments Affecting Rehab Potential HTN, chronic pain  syndrome, obesity   PT Frequency 2x / week   PT Duration 8 weeks   PT Treatment/Interventions ADLs/Self Care Home  Management;Cryotherapy;Moist Heat;Traction;Therapeutic  exercise;Therapeutic activities;Functional mobility training;Gait  training;Stair training;Manual techniques;Taping;Dry needling;Passive  range of motion;Neuromuscular re-education;Ultrasound;Patient/family  education   PT Next Visit Plan Postural education (review), MHP, STM, manual  traction. Add upper trap and levator scap stretch.    PT Home Exercise Plan postural education    Consulted and Agree with Plan of Care Patient     Patient will benefit from skilled therapeutic intervention in order to improve the following deficits and impairments:  Abnormal gait, Decreased activity tolerance, Decreased balance, Decreased mobility, Decreased strength, Postural dysfunction, Improper body mechanics, Impaired flexibility, Pain, Obesity, Increased muscle spasms, Impaired tone, Decreased range of motion, Difficulty walking, Decreased endurance, Decreased safety awareness, Impaired perceived functional ability, Dizziness, Decreased knowledge of precautions  Visit  Diagnosis: Cervicalgia  (primary encounter diagnosis) Plan: PT plan of care cert/re-cert  Fibromyalgia Plan: PT plan of care cert/re-cert  Cramp and spasm  Abnormal posture            Problem List Patient Active Problem List   Diagnosis Date Noted  . Cervical myofascial pain syndrome 01/08/2016  . CTS (carpal tunnel syndrome) 12/05/2015  . Radiculopathy of lumbosacral region 12/05/2015  . Morning headache 08/29/2015  . Daytime somnolence 08/29/2015  . Obesity 08/29/2015  . Snoring 08/29/2015  . Paresthesias 08/29/2015  . Convulsions/seizures ([place]) 08/29/2015  . Altered awareness, transient   . Cervical paraspinal muscle spasm 08/03/2015  . Other specified transient cerebral ischemias   . Essential hypertension   . Noncompliance with medication regimen   . TIA (transient ischemic attack) 08/02/2015  . Leg swelling 01/29/2015  . Positive ANA (antinuclear antibody) 01/29/2015  . Dizzy spells 01/01/2015  . Shortness of breath 11/20/2014  . DDD (degenerative disc disease), cervical 11/09/2013  . Generalized anxiety disorder 12/27/2012  . 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 Allergic rhinitis 11/27/2012  . Hyperlipidemia 12/22/2011  . MVP (mitral valve prolapse) 12/22/2011  . S/P dilatation of esophageal stricture 12/22/2011  . Atypical chest pain 08/19/2011    Dollene Cleveland , PT  01/28/2016, 12:55 PM  [facility] [address] [place], [place], [address] Phone: [phone]   Fax:  [phone]  Name: [name] MRN: [id number] Date of Birth: October 18, 1961   Pt did not return for treatment after initial eval. DC PT.  Romualdo Bolk, PT, DPT 06/11/16 9:39 AM Phone: [phone] Fax: [phone]

## 2016-01-28 NOTE — Telephone Encounter (Signed)
Rx was refilled 11/18/15 for #30 with 3 refills. Patient wants to switch to lowest dose possible - please advise.

## 2016-01-28 NOTE — Telephone Encounter (Signed)
Pt thought cory was to sen in new RX for  FLUoxetine (PROZAC)   Pt was on a higher dose in the past, but wants to switch to the lowest dose possible.  [facility]   Pt wants cory to know her bp is till the same.

## 2016-01-30 NOTE — Telephone Encounter (Signed)
Ok to send in prescription for 10 mg Prozac daily x 2 refills.  What have her blood pressure readings been?

## 2016-01-30 NOTE — Telephone Encounter (Signed)
Left message for patient to return phone call.  

## 2016-01-31 NOTE — Telephone Encounter (Signed)
Patient returned phone call. Patient now wants to know if it is possible to be prescribed Clonazepam instead of Prozac - she states that she was told that Clonazepam is easier to wean off of than Prozac. Please advise.   Patient states that her blood pressure readings have been averaging at 150/90.

## 2016-01-31 NOTE — Telephone Encounter (Signed)
She needs to be seen to discuss this.

## 2016-01-31 NOTE — Telephone Encounter (Signed)
Left message for patient to return phone call.  

## 2016-02-02 ENCOUNTER — Other Ambulatory Visit: Payer: Self-pay | Admitting: Adult Health

## 2016-02-03 NOTE — Telephone Encounter (Signed)
Appt scheduled for 02/11/16

## 2016-02-04 ENCOUNTER — Encounter: Payer: BLUE CROSS/BLUE SHIELD | Attending: Registered Nurse | Admitting: Registered Nurse

## 2016-02-04 DIAGNOSIS — R42 Dizziness and giddiness: Secondary | ICD-10-CM | POA: Insufficient documentation

## 2016-02-04 DIAGNOSIS — R269 Unspecified abnormalities of gait and mobility: Secondary | ICD-10-CM | POA: Insufficient documentation

## 2016-02-04 DIAGNOSIS — I10 Essential (primary) hypertension: Secondary | ICD-10-CM | POA: Insufficient documentation

## 2016-02-04 DIAGNOSIS — K589 Irritable bowel syndrome without diarrhea: Secondary | ICD-10-CM | POA: Insufficient documentation

## 2016-02-04 DIAGNOSIS — M545 Low back pain: Secondary | ICD-10-CM | POA: Insufficient documentation

## 2016-02-04 DIAGNOSIS — G8929 Other chronic pain: Secondary | ICD-10-CM | POA: Insufficient documentation

## 2016-02-04 DIAGNOSIS — Z8673 Personal history of transient ischemic attack (TIA), and cerebral infarction without residual deficits: Secondary | ICD-10-CM | POA: Insufficient documentation

## 2016-02-04 DIAGNOSIS — R410 Disorientation, unspecified: Secondary | ICD-10-CM | POA: Insufficient documentation

## 2016-02-04 DIAGNOSIS — F419 Anxiety disorder, unspecified: Secondary | ICD-10-CM | POA: Insufficient documentation

## 2016-02-04 DIAGNOSIS — M797 Fibromyalgia: Secondary | ICD-10-CM | POA: Insufficient documentation

## 2016-02-04 DIAGNOSIS — M329 Systemic lupus erythematosus, unspecified: Secondary | ICD-10-CM | POA: Insufficient documentation

## 2016-02-04 DIAGNOSIS — R531 Weakness: Secondary | ICD-10-CM | POA: Insufficient documentation

## 2016-02-04 DIAGNOSIS — M79609 Pain in unspecified limb: Secondary | ICD-10-CM | POA: Insufficient documentation

## 2016-02-11 ENCOUNTER — Encounter: Payer: Self-pay | Admitting: Adult Health

## 2016-02-11 ENCOUNTER — Ambulatory Visit: Payer: BLUE CROSS/BLUE SHIELD | Attending: Physical Medicine & Rehabilitation

## 2016-02-11 ENCOUNTER — Ambulatory Visit (INDEPENDENT_AMBULATORY_CARE_PROVIDER_SITE_OTHER): Payer: BLUE CROSS/BLUE SHIELD | Admitting: Adult Health

## 2016-02-11 VITALS — BP 160/100 | Temp 97.7°F | Wt 215.0 lb

## 2016-02-11 DIAGNOSIS — F411 Generalized anxiety disorder: Secondary | ICD-10-CM

## 2016-02-11 DIAGNOSIS — I10 Essential (primary) hypertension: Secondary | ICD-10-CM | POA: Diagnosis not present

## 2016-02-11 LAB — BASIC METABOLIC PANEL
BUN: 12 mg/dL (ref 6–23)
CHLORIDE: 105 meq/L (ref 96–112)
CO2: 29 mEq/L (ref 19–32)
Calcium: 9.4 mg/dL (ref 8.4–10.5)
Creatinine, Ser: 0.75 mg/dL (ref 0.40–1.20)
GFR: 103.32 mL/min (ref >60.00)
Glucose, Bld: 89 mg/dL (ref 70–99)
POTASSIUM: 4 meq/L (ref 3.5–5.1)
SODIUM: 140 meq/L (ref 135–145)

## 2016-02-11 MED ORDER — HYDROCHLOROTHIAZIDE 25 MG PO TABS: 25.0000 mg | ORAL_TABLET | Freq: Every day | ORAL | Status: DC

## 2016-02-11 NOTE — Addendum Note (Signed)
Addended by: Dollene Cleveland on: 02/11/2016 08:28 AM   Modules accepted: Orders

## 2016-02-11 NOTE — Patient Instructions (Signed)
I have sent in a prescription for HCTZ - this is in addition to the Enalapril.   Follow up with psychiatry   Follow up with me in two weeks.

## 2016-02-11 NOTE — Progress Notes (Signed)
Subjective:    Patient ID: [name], female    DOB: Mar 11, 1961, 55 y.o.   MRN: [id number]  HPI   This is a 55 year old female who presents to the office today for follow-up regarding hypertension and fibromyalgia.   1) she reports varying pressure readings the lowest that she seen has been 140/80, she is not checking them at home, as checking the blood pressure causes her much anxiety which she reports increases her blood pressure. He is taking enalapril 20 mg twice a day, and has not had any issues with this medication. In the office her blood pressure is 160/100 and she endorses having headache for the last 3 days. Also reports that she has not slept in 5 days due to the end of the semester fastly approaching and she needs to get some work done as well as study for some tests.  In the past, I have asked her numerous times to see psychiatry for the anxiety as this is one of the contributing factors are hypertension. She has not seen psychiatry due to "I don't think this is a my head".  2) in the past I prescribed her Prozac 20 mg and 25 mg of amitriptyline to help with her fibromyalgia pain. She reports currently just taking 10 mg of Prozac and not taking amitriptyline. Per patient "I don't like to be on medications but I feel like him on too many as it is right now." Also feels like she seen to many specialists and everyone is changing her medications around.    Review of Systems  Constitutional: Positive for activity change and fatigue.  Eyes: Negative.   Respiratory: Negative.   Cardiovascular: Negative.   Gastrointestinal: Negative.   Musculoskeletal: Positive for myalgias, back pain and arthralgias. Negative for joint swelling, gait problem, neck pain and neck stiffness.  Skin: Negative.   Neurological: Positive for headaches.  Psychiatric/Behavioral: Positive for sleep disturbance and decreased concentration. Negative for suicidal ideas and self-injury. The patient is  nervous/anxious.   All other systems reviewed and are negative.  Past Medical History  Diagnosis Date  . Hypertension   . Edema   . Pain in limb   . Back pain   . Asthma   . MVP (mitral valve prolapse) 12/22/2011    not confirmed on Echo 08/2015  . S/P dilatation of esophageal stricture 12/22/2011  . Positive ANA (antinuclear antibody) 01/29/2015  . Lupus ([place])   . Diastolic heart failure ([place])   . IBS (irritable bowel syndrome)   . Anxiety   . Syncopal episodes   . TIA (transient ischemic attack)   . Chronic pain   . Fibromyalgia   . Atypical chest pain     in the setting of anxiety    Social History   Social History  . Marital Status: Legally Separated    Spouse Name: N/A  . Number of Children: 3  . Years of Education: 18   Occupational History  . Student      counseling   Social History Main Topics  . Smoking status: Never Smoker   . Smokeless tobacco: Never Used  . Alcohol Use: No  . Drug Use: No  . Sexual Activity: Not on file   Other Topics Concern  . Not on file   Social History Narrative   Born and raised in [place], [place]. Currently resides in an apartment with her daughter. No pets. Fun: singing/vocalist;   Denies religious beliefs that would effect health care.  Caffeine use: 1 cup every morning    Past Surgical History  Procedure Laterality Date  . Abdominal hysterectomy    . Cardiac catheterization    . Tonsillectomy    . Spine surgery    . Cardiac catheterization  07/2014    normal coronary arteries [facility])    Family History  Problem Relation Age of Onset  . Diabetes Mother   . Hypertension Mother   . Heart disease Mother   . Diabetes Father   . Hypertension Father   . COPD Father   . Heart attack Father 70  . Stroke Other   . Coronary artery disease Other   . Arthritis/Rheumatoid Mother   . Emphysema Father     Allergies  Allergen Reactions  . Gabapentin Other (See Comments)    Caused stroke symptoms   .  Hydrochlorothiazide     cramps  . Metoprolol Swelling  . Morphine And Related Other (See Comments)    hallucinations  . Other Other (See Comments)    Other reaction(s): Other (See Comments) NARCOTICS=aggitation, hallucination Pt states allergic to "any narcotics"  . Pantoprazole Sodium Other (See Comments)    blisters  . Prednisone Other (See Comments)    Blisters on tongue    Current Outpatient Prescriptions on File Prior to Visit  Medication Sig Dispense Refill  . amitriptyline (ELAVIL) 25 MG tablet Take 1 tablet (25 mg total) by mouth at bedtime. 30 tablet 1  . aspirin 81 MG chewable tablet Chew 81 mg by mouth daily.    . Cyanocobalamin (B-12 PO) Take 2 tablets by mouth daily.    . enalapril (VASOTEC) 20 MG tablet TAKE TWO TABLETS BY MOUTH ONCE DAILY 60 tablet 0  . FLUoxetine (PROZAC) 20 MG tablet Take 1 tablet (20 mg total) by mouth daily. (Patient taking differently: Take 10 mg by mouth daily. ) 30 tablet 3  . LORazepam (ATIVAN) 1 MG tablet Take 1 tablet (1 mg total) by mouth 2 (two) times daily. (Patient not taking: Reported on 02/11/2016) 60 tablet 0   No current facility-administered medications on file prior to visit.    BP 160/100 mmHg  Temp(Src) 97.7 F (36.5 C) (Oral)  Wt 215 lb (97.523 kg)       Objective:   Physical Exam  Constitutional: She is oriented to person, place, and time. She appears well-developed and well-nourished. No distress.  Eyes: Conjunctivae and EOM are normal. Pupils are equal, round, and reactive to light. Right eye exhibits no discharge. Left eye exhibits no discharge.  Cardiovascular: Normal rate, regular rhythm, normal heart sounds and intact distal pulses.  Exam reveals no gallop and no friction rub.   No murmur heard. Pulmonary/Chest: Effort normal and breath sounds normal. No respiratory distress. She has no wheezes. She has no rales. She exhibits no tenderness.  Neurological: She is alert and oriented to person, place, and time.    Skin: Skin is warm and dry. No rash noted. She is not diaphoretic. No erythema. No pallor.  Psychiatric: She has a normal mood and affect. Her behavior is normal. Judgment and thought content normal.  Anxious and talking in circles  Nursing note and vitals reviewed.     Assessment & Plan:  1. Essential hypertension - Tinny with enalapril 20 mg twice a day - hydrochlorothiazide (HYDRODIURIL) 25 MG tablet; Take 1 tablet (25 mg total) by mouth daily.  Dispense: 90 tablet; Refill: 3 - Basic metabolic panel - Follow up in 2 weeks -Blood pressure 3 times a  week and bring journal to next visit  2. Generalized anxiety disorder - Asked her to increase the Prozac to 20 mg as that was what was prescribed. She is willing to do this. I'm hoping that the increase in Prozac will help with her anxiety  - List of psychiatrists given to patient she is asked to make a appointment with psychiatry by the next time she sees me   Dorothyann Peng, NP

## 2016-02-25 ENCOUNTER — Ambulatory Visit: Payer: BLUE CROSS/BLUE SHIELD | Admitting: Adult Health

## 2016-02-25 DIAGNOSIS — Z0289 Encounter for other administrative examinations: Secondary | ICD-10-CM

## 2016-03-03 ENCOUNTER — Ambulatory Visit (INDEPENDENT_AMBULATORY_CARE_PROVIDER_SITE_OTHER): Payer: BLUE CROSS/BLUE SHIELD | Admitting: Adult Health

## 2016-03-03 ENCOUNTER — Encounter: Payer: Self-pay | Admitting: Adult Health

## 2016-03-03 VITALS — BP 142/82 | Temp 98.4°F | Wt 214.5 lb

## 2016-03-03 DIAGNOSIS — F411 Generalized anxiety disorder: Secondary | ICD-10-CM

## 2016-03-03 DIAGNOSIS — G894 Chronic pain syndrome: Secondary | ICD-10-CM | POA: Diagnosis not present

## 2016-03-03 DIAGNOSIS — I10 Essential (primary) hypertension: Secondary | ICD-10-CM | POA: Diagnosis not present

## 2016-03-03 MED ORDER — CITALOPRAM HYDROBROMIDE 10 MG PO TABS: 10.0000 mg | ORAL_TABLET | Freq: Every day | ORAL | Status: DC

## 2016-03-03 NOTE — Progress Notes (Signed)
Subjective:    Patient ID: [name], female    DOB: [birth year], 55 y.o.   MRN: [id number]  HPI 55 year old female who presents to the office today for follow up regarding hypertension and fibromyalgia.   She reports that she tried taking Prozac 20 mg and it made her feel shaky, anxious, and palpitations. She is not taking any Prozac currently.   Today in the office her blood pressure is better at 142/82.   She has recently graduated from college. She now would like to start focusing on herself.   She has not called psych yet.    Review of Systems  Constitutional: Negative.   Respiratory: Negative.   Cardiovascular: Negative.   All other systems reviewed and are negative.  Past Medical History  Diagnosis Date  . Hypertension   . Edema   . Pain in limb   . Back pain   . Asthma   . MVP (mitral valve prolapse) 12/22/2011    not confirmed on Echo 08/2015  . S/P dilatation of esophageal stricture 12/22/2011  . Positive ANA (antinuclear antibody) 01/29/2015  . Lupus ([place])   . Diastolic heart failure ([place])   . IBS (irritable bowel syndrome)   . Anxiety   . Syncopal episodes   . TIA (transient ischemic attack)   . Chronic pain   . Fibromyalgia   . Atypical chest pain     in the setting of anxiety    Social History   Social History  . Marital Status: Legally Separated    Spouse Name: N/A  . Number of Children: 3  . Years of Education: 18   Occupational History  . Student      counseling   Social History Main Topics  . Smoking status: Never Smoker   . Smokeless tobacco: Never Used  . Alcohol Use: No  . Drug Use: No  . Sexual Activity: Not on file   Other Topics Concern  . Not on file   Social History Narrative   Born and raised in [place], [place]. Currently resides in an apartment with her daughter. No pets. Fun: singing/vocalist;   Denies religious beliefs that would effect health care.       Caffeine use: 1 cup every morning    Past Surgical History   Procedure Laterality Date  . Abdominal hysterectomy    . Cardiac catheterization    . Tonsillectomy    . Spine surgery    . Cardiac catheterization  07/2014    normal coronary arteries [facility])    Family History  Problem Relation Age of Onset  . Diabetes Mother   . Hypertension Mother   . Heart disease Mother   . Diabetes Father   . Hypertension Father   . COPD Father   . Heart attack Father 29  . Stroke Other   . Coronary artery disease Other   . Arthritis/Rheumatoid Mother   . Emphysema Father     Allergies  Allergen Reactions  . Gabapentin Other (See Comments)    Caused stroke symptoms   . Hydrochlorothiazide     cramps  . Metoprolol Swelling  . Morphine And Related Other (See Comments)    hallucinations  . Other Other (See Comments)    Other reaction(s): Other (See Comments) NARCOTICS=aggitation, hallucination Pt states allergic to "any narcotics"  . Pantoprazole Sodium Other (See Comments)    blisters  . Prednisone Other (See Comments)    Blisters on tongue    Current  Outpatient Prescriptions on File Prior to Visit  Medication Sig Dispense Refill  . amitriptyline (ELAVIL) 25 MG tablet Take 1 tablet (25 mg total) by mouth at bedtime. 30 tablet 1  . aspirin 81 MG chewable tablet Chew 81 mg by mouth daily.    . Cyanocobalamin (B-12 PO) Take 2 tablets by mouth daily.    . enalapril (VASOTEC) 20 MG tablet TAKE TWO TABLETS BY MOUTH ONCE DAILY 60 tablet 0  . hydrochlorothiazide (HYDRODIURIL) 25 MG tablet Take 1 tablet (25 mg total) by mouth daily. 90 tablet 3  . LORazepam (ATIVAN) 1 MG tablet Take 1 tablet (1 mg total) by mouth 2 (two) times daily. 60 tablet 0   No current facility-administered medications on file prior to visit.    BP 142/82 mmHg  Temp(Src) 98.4 F (36.9 C) (Oral)  Wt 214 lb 8 oz (97.297 kg)        Objective:   Physical Exam  Constitutional: She is oriented to person, place, and time. She appears well-developed and well-nourished. No  distress.  Cardiovascular: Normal rate, regular rhythm, normal heart sounds and intact distal pulses.  Exam reveals no gallop and no friction rub.   No murmur heard. Pulmonary/Chest: Effort normal and breath sounds normal. No respiratory distress. She has no wheezes. She has no rales. She exhibits no tenderness.  Neurological: She is alert and oriented to person, place, and time.  Skin: Skin is warm and dry. No rash noted. She is not diaphoretic. No erythema. No pallor.  Psychiatric: She has a normal mood and affect. Her behavior is normal. Judgment and thought content normal.  Anxious and talking fast  Nursing note and vitals reviewed.       Assessment & Plan:  1. Chronic pain syndrome - Ambulatory referral to Pain Clinic - Stop prozac  2. Essential hypertension - Better controlled today. No change in medications at this point  3. Anxiety state - Stressed the importance of following up with psychiatry. She promises to call this week.  - citalopram (CELEXA) 10 MG tablet; Take 1 tablet (10 mg total) by mouth daily.  Dispense: 30 tablet; Refill: 3 - Follow up in 1 month   Dorothyann Peng, NP

## 2016-03-03 NOTE — Patient Instructions (Signed)
It was great seeing you again today   Stop taking prozac and switch to Celexa   Someone from pain management will call you to schedule an appointment  Call psych !!!!

## 2016-03-09 ENCOUNTER — Other Ambulatory Visit: Payer: Self-pay | Admitting: Adult Health

## 2016-03-30 ENCOUNTER — Encounter (HOSPITAL_COMMUNITY): Payer: Self-pay | Admitting: Emergency Medicine

## 2016-03-30 ENCOUNTER — Emergency Department (HOSPITAL_COMMUNITY): Payer: BLUE CROSS/BLUE SHIELD

## 2016-03-30 ENCOUNTER — Emergency Department (HOSPITAL_COMMUNITY)
Admission: EM | Admit: 2016-03-30 | Discharge: 2016-03-31 | Disposition: A | Discharge status: Home / Self Care | Payer: BLUE CROSS/BLUE SHIELD | Attending: Emergency Medicine | Admitting: Emergency Medicine

## 2016-03-30 DIAGNOSIS — Z8673 Personal history of transient ischemic attack (TIA), and cerebral infarction without residual deficits: Secondary | ICD-10-CM | POA: Insufficient documentation

## 2016-03-30 DIAGNOSIS — I110 Hypertensive heart disease with heart failure: Secondary | ICD-10-CM | POA: Diagnosis not present

## 2016-03-30 DIAGNOSIS — Z7982 Long term (current) use of aspirin: Secondary | ICD-10-CM | POA: Insufficient documentation

## 2016-03-30 DIAGNOSIS — J45909 Unspecified asthma, uncomplicated: Secondary | ICD-10-CM | POA: Diagnosis not present

## 2016-03-30 DIAGNOSIS — M791 Myalgia, unspecified site: Secondary | ICD-10-CM

## 2016-03-30 DIAGNOSIS — M542 Cervicalgia: Secondary | ICD-10-CM | POA: Insufficient documentation

## 2016-03-30 DIAGNOSIS — I5030 Unspecified diastolic (congestive) heart failure: Secondary | ICD-10-CM | POA: Diagnosis not present

## 2016-03-30 DIAGNOSIS — M62838 Other muscle spasm: Secondary | ICD-10-CM | POA: Diagnosis present

## 2016-03-30 LAB — COMPREHENSIVE METABOLIC PANEL
ALK PHOS: 78 U/L (ref 38–126)
ALT: 16 U/L (ref 14–54)
ANION GAP: 8 (ref 5–15)
AST: 22 U/L (ref 15–41)
Albumin: 4.3 g/dL (ref 3.5–5.0)
BILIRUBIN TOTAL: 0.4 mg/dL (ref 0.3–1.2)
BUN: 16 mg/dL (ref 6–20)
CALCIUM: 9.2 mg/dL (ref 8.9–10.3)
CO2: 25 mmol/L (ref 22–32)
CREATININE: 0.83 mg/dL (ref 0.44–1.00)
Chloride: 102 mmol/L (ref 101–111)
GFR calc non Af Amer: >60 mL/min (ref >60)
Glucose, Bld: 101 mg/dL — ABNORMAL HIGH (ref 65–99)
Potassium: 4.1 mmol/L (ref 3.5–5.1)
SODIUM: 135 mmol/L (ref 135–145)
TOTAL PROTEIN: 7.1 g/dL (ref 6.5–8.1)

## 2016-03-30 LAB — CBC WITH DIFFERENTIAL/PLATELET
Basophils Absolute: 0 10*3/uL (ref 0.0–0.1)
Basophils Relative: 0 %
Eosinophils Absolute: 0.2 10*3/uL (ref 0.0–0.7)
Eosinophils Relative: 3 %
HEMATOCRIT: 36.8 % (ref 36.0–46.0)
HEMOGLOBIN: 12.7 g/dL (ref 12.0–15.0)
LYMPHS ABS: 2.8 10*3/uL (ref 0.7–4.0)
LYMPHS PCT: 35 %
MCH: 30.3 pg (ref 26.0–34.0)
MCHC: 34.5 g/dL (ref 30.0–36.0)
MCV: 87.8 fL (ref 78.0–100.0)
MONOS PCT: 5 %
Monocytes Absolute: 0.4 10*3/uL (ref 0.1–1.0)
NEUTROS ABS: 4.4 10*3/uL (ref 1.7–7.7)
NEUTROS PCT: 57 %
Platelets: 189 10*3/uL (ref 150–400)
RBC: 4.19 MIL/uL (ref 3.87–5.11)
RDW: 12.8 % (ref 11.5–15.5)
WBC: 7.8 10*3/uL (ref 4.0–10.5)

## 2016-03-30 LAB — I-STAT TROPONIN, ED: Troponin i, poc: 0 ng/mL (ref 0.00–0.08)

## 2016-03-30 MED ORDER — FAMOTIDINE IN NACL 20-0.9 MG/50ML-% IV SOLN: 20.0000 mg | Freq: Once | INTRAVENOUS | Status: DC

## 2016-03-30 MED ORDER — DIAZEPAM 5 MG/ML IJ SOLN
5.0000 mg | Freq: Once | INTRAMUSCULAR | Status: AC
  Administered 2016-03-30: 5 mg via INTRAMUSCULAR
  Filled 2016-03-30: qty 2

## 2016-03-30 NOTE — ED Notes (Signed)
Nurse starting IV, drawing labs 

## 2016-03-30 NOTE — ED Notes (Signed)
Pt reports generalized weakness and muscle spasms throughout her entire body.  Patient reports "not feeling like herself".  Pt states she is afraid her potassium might be off due to lasix use and not taking supplements.

## 2016-03-30 NOTE — ED Provider Notes (Signed)
CSN: [id number]     Arrival date & time 03/30/16  2053 History  By signing my name below, I, Soijett Blue, attest that this documentation has been prepared under the direction and in the presence of Josephina Gip, PA-C Electronically Signed: Soijett Blue, ED Scribe. 03/30/2016. 10:38 PM.   Chief Complaint  Patient presents with  . Spasms   The history is provided by the patient. No language interpreter was used.   HPI Comments: [name] is a 55 y.o. female with a PMHx of lupus, chronic pain, fibromyalgia, and HTN, who presents to the Emergency Department complaining of generalized spasms onset yesterday with worsening today. Pt states that she is having spasming and cramping to her bilateral lower and upper extremities. Denies specific exacerbating factors to the cramps. States they are painful and cause her entire body to ache. Pt notes that she has had intermittent cramping to her bilateral hands in the past but not to the extent of her pain tonight. She was diagnosed with carpal tunnel which she states she does not believe is the true diagnosis. She is also requesting evaluation of chronic neck pain. Denies recent injury or change in her neck pain but she is concerned the neck is what is causing her arm and leg spasms. She states that she had episodes of palpitations x yesterday. Denies associated chest pain or SOB. The palpitations have resolved today. She states that she has not tried any medications for the relief for her symptoms. She denies any other symptoms.  Chart review: Pt seen by Family medicine and physical rehabilitation for chronic, generalized pain complaints  Past Medical History  Diagnosis Date  . Hypertension   . Edema   . Pain in limb   . Back pain   . Asthma   . MVP (mitral valve prolapse) 12/22/2011    not confirmed on Echo 08/2015  . S/P dilatation of esophageal stricture 12/22/2011  . Positive ANA (antinuclear antibody) 01/29/2015  . Lupus ([place])   . Diastolic  heart failure ([place])   . IBS (irritable bowel syndrome)   . Anxiety   . Syncopal episodes   . TIA (transient ischemic attack)   . Chronic pain   . Fibromyalgia   . Atypical chest pain     in the setting of anxiety   Past Surgical History  Procedure Laterality Date  . Abdominal hysterectomy    . Cardiac catheterization    . Tonsillectomy    . Spine surgery    . Cardiac catheterization  07/2014    normal coronary arteries [facility])   Family History  Problem Relation Age of Onset  . Diabetes Mother   . Hypertension Mother   . Heart disease Mother   . Diabetes Father   . Hypertension Father   . COPD Father   . Heart attack Father 18  . Stroke Other   . Coronary artery disease Other   . Arthritis/Rheumatoid Mother   . Emphysema Father    Social History  Substance Use Topics  . Smoking status: Never Smoker   . Smokeless tobacco: Never Used  . Alcohol Use: No   OB History    No data available     Review of Systems  All other systems reviewed and are negative.     Allergies  Hydroxychloroquine; Gabapentin; Hydrochlorothiazide; Metoprolol; Morphine and related; Other; Pantoprazole sodium; and Prednisone  Home Medications   Prior to Admission medications   Medication Sig Start Date End Date Taking? Authorizing Provider  aspirin 81 MG chewable tablet Chew 81 mg by mouth daily.   Yes Historical Provider, MD  calcium carbonate (OS-CAL) 1250 (500 Ca) MG chewable tablet Chew 1 tablet by mouth daily.   Yes Historical Provider, MD  Cholecalciferol (D3 ADULT PO) Take 1 tablet by mouth daily.   Yes Historical Provider, MD  Cyanocobalamin (B-12 PO) Take 2 tablets by mouth daily.   Yes Historical Provider, MD  enalapril (VASOTEC) 20 MG tablet TAKE TWO TABLETS BY MOUTH ONCE DAILY 03/10/16  Yes Dorothyann Peng, NP  furosemide (LASIX) 40 MG tablet Take 1 tablet by mouth daily. 03/23/16  Yes Historical Provider, MD  amitriptyline (ELAVIL) 25 MG tablet Take 1 tablet (25 mg total) by  mouth at bedtime. 01/07/16   Dorothyann Peng, NP  citalopram (CELEXA) 10 MG tablet Take 1 tablet (10 mg total) by mouth daily. Patient not taking: Reported on 03/30/2016 03/03/16   Dorothyann Peng, NP  hydrochlorothiazide (HYDRODIURIL) 25 MG tablet Take 1 tablet (25 mg total) by mouth daily. 02/11/16   Dorothyann Peng, NP  LORazepam (ATIVAN) 1 MG tablet Take 1 tablet (1 mg total) by mouth 2 (two) times daily. 12/02/15   Dorothyann Peng, NP  methocarbamol (ROBAXIN) 500 MG tablet Take 1 tablet (500 mg total) by mouth 2 (two) times daily. 03/31/16   Kinberly Perris, PA-C   BP 140/83 mmHg  Pulse 57  Temp(Src) 98.3 F (36.8 C) (Oral)  Resp 16  Ht 5\' 7"  (1.702 m)  Wt 97.07 kg  BMI 33.51 kg/m2  SpO2 100% Physical Exam  Constitutional: She appears well-developed and well-nourished. No distress.  Nontoxic-appearing  HENT:  Head: Normocephalic and atraumatic.  Right Ear: External ear normal.  Left Ear: External ear normal.  Mouth/Throat: Oropharynx is clear and moist.  Eyes: Conjunctivae and EOM are normal. Pupils are equal, round, and reactive to light. Right eye exhibits no discharge. Left eye exhibits no discharge. No scleral icterus.  Neck: Normal range of motion.  Cardiovascular: Normal rate, regular rhythm and normal heart sounds.  Exam reveals no gallop and no friction rub.   No murmur heard. Pulmonary/Chest: Effort normal and breath sounds normal. No respiratory distress. She has no wheezes. She has no rales.  Musculoskeletal: Normal range of motion.  Moves all extremities spontaneously. No spasm noted in any of the extremities. All joints are supple without swelling or deformity and with full range of motion.  Neurological: She is alert. Coordination normal.  Strength 5/5 in the bilateral upper and lower extremities. Sensation to light touch intact throughout. No muscle atrophy. Walks with steady gait.  Skin: Skin is warm and dry.  Psychiatric: She has a normal mood and affect. Her behavior is normal.   Nursing note and vitals reviewed.   ED Course  Procedures (including critical care time) DIAGNOSTIC STUDIES: Oxygen Saturation is 99% on RA, nl by my interpretation.    COORDINATION OF CARE: 10:38 PM Discussed treatment plan with pt at bedside which includes CXR, valium, UA, labs, EKG, and pt agreed to plan.    Labs Review Labs Reviewed  COMPREHENSIVE METABOLIC PANEL - Abnormal; Notable for the following:    Glucose, Bld 101 (*)    All other components within normal limits  CBC WITH DIFFERENTIAL/PLATELET  URINALYSIS, ROUTINE W REFLEX MICROSCOPIC (NOT AT [facility])  URINE RAPID DRUG SCREEN, HOSP PERFORMED  I-STAT TROPOININ, ED    Imaging Review Dg Chest 2 View  03/30/2016  CLINICAL DATA:  Multifocal body pain. Cramping. Productive cough. Nasal congestion. EXAM: CHEST  2 VIEW COMPARISON:  10/02/2015 FINDINGS: The heart size and mediastinal contours are within normal limits. Both lungs are clear. The visualized skeletal structures are unremarkable. IMPRESSION: No active cardiopulmonary disease. Electronically Signed   By: Van Clines M.D.   On: 03/30/2016 23:24   I have personally reviewed and evaluated these images and lab results as part of my medical decision-making.   EKG Interpretation   Date/Time:  Monday Mar 30 2016 22:51:54 EDT Ventricular Rate:  57 PR Interval:  176 QRS Duration: 106 QT Interval:  456 QTC Calculation: 444 R Axis:   9 Text Interpretation:  Sinus rhythm RSR' in V1 or V2, probably normal  variant When compared with ECG of 11/21/2015, No significant change was  found Confirmed by [facility]  MD, DAVID ([id number]) on 03/30/2016 11:41:55 PM      MDM   Final diagnoses:  Neck pain  Muscular aches   55 year old female with history of fibromyalgia presenting with generalized myalgias and complaints of muscle spasms. Afebrile. Initially hypertensive but blood pressure reduced as patient was monitored in emergency department. Patient is nontoxic appearing. She  does seem somewhat anxious and is very focused on her generalized pain complaints. She repeatedly asks "what do you think is causing this pain". Discussed patient's fibromyalgia with her and she states that this does feel similar to her chronic pain but worse in severity. Nonfocal neuro exam. Extremities are nontender to palpation with full range of motion. Benign physical exam. Blood work is largely unremarkable. Given history of palpitations, EKG was obtained which was normal sinus rhythm. Patient given Valium which she reports significant improved her symptoms. I feel the patient can safely be discharged with outpatient follow-up with either her family medicine or physical therapist. Will discharge with short course of Robaxin. Encouraged to continue her daily fibromyalgia medications. Return precautions given in discharge paperwork and discussed with pt at bedside. Pt stable for discharge   I personally performed the services described in this documentation, which was scribed in my presence. The recorded information has been reviewed and is accurate.    Josephina Gip, PA-C 03/31/16 [place], MD 04/01/16 [phone]

## 2016-03-31 LAB — URINALYSIS, ROUTINE W REFLEX MICROSCOPIC
BILIRUBIN URINE: NEGATIVE
Glucose, UA: NEGATIVE mg/dL
HGB URINE DIPSTICK: NEGATIVE
KETONES UR: NEGATIVE mg/dL
Leukocytes, UA: NEGATIVE
Nitrite: NEGATIVE
PROTEIN: NEGATIVE mg/dL
Specific Gravity, Urine: 1.021 (ref 1.005–1.030)
pH: 6 (ref 5.0–8.0)

## 2016-03-31 LAB — RAPID URINE DRUG SCREEN, HOSP PERFORMED
Amphetamines: NOT DETECTED
Barbiturates: NOT DETECTED
Benzodiazepines: NOT DETECTED
Cocaine: NOT DETECTED
OPIATES: NOT DETECTED
TETRAHYDROCANNABINOL: NOT DETECTED

## 2016-03-31 MED ORDER — FAMOTIDINE 20 MG PO TABS
20.0000 mg | ORAL_TABLET | Freq: Once | ORAL | Status: AC
  Administered 2016-03-31: 20 mg via ORAL
  Filled 2016-03-31: qty 1

## 2016-03-31 MED ORDER — METHOCARBAMOL 500 MG PO TABS: 500.0000 mg | ORAL_TABLET | Freq: Two times a day (BID) | ORAL | Status: DC

## 2016-03-31 NOTE — Discharge Instructions (Signed)
Follow up with your PCP and physical therapist.    Musculoskeletal Pain Musculoskeletal pain is muscle and boney aches and pains. These pains can occur in any part of the body. Your caregiver may treat you without knowing the cause of the pain. They may treat you if blood or urine tests, X-rays, and other tests were normal.  CAUSES There is often not a definite cause or reason for these pains. These pains may be caused by a type of germ (virus). The discomfort may also come from overuse. Overuse includes working out too hard when your body is not fit. Boney aches also come from weather changes. Bone is sensitive to atmospheric pressure changes. HOME CARE INSTRUCTIONS   Ask when your test results will be ready. Make sure you get your test results.  Only take over-the-counter or prescription medicines for pain, discomfort, or fever as directed by your caregiver. If you were given medications for your condition, do not drive, operate machinery or power tools, or sign legal documents for 24 hours. Do not drink alcohol. Do not take sleeping pills or other medications that may interfere with treatment.  Continue all activities unless the activities cause more pain. When the pain lessens, slowly resume normal activities. Gradually increase the intensity and duration of the activities or exercise.  During periods of severe pain, bed rest may be helpful. Lay or sit in any position that is comfortable.  Putting ice on the injured area.  Put ice in a bag.  Place a towel between your skin and the bag.  Leave the ice on for 15 to 20 minutes, 3 to 4 times a day.  Follow up with your caregiver for continued problems and no reason can be found for the pain. If the pain becomes worse or does not go away, it may be necessary to repeat tests or do additional testing. Your caregiver may need to look further for a possible cause. SEEK IMMEDIATE MEDICAL CARE IF:  You have pain that is getting worse and is not  relieved by medications.  You develop chest pain that is associated with shortness or breath, sweating, feeling sick to your stomach (nauseous), or throw up (vomit).  Your pain becomes localized to the abdomen.  You develop any new symptoms that seem different or that concern you. MAKE SURE YOU:   Understand these instructions.  Will watch your condition.  Will get help right away if you are not doing well or get worse.   This information is not intended to replace advice given to you by your health care provider. Make sure you discuss any questions you have with your health care provider.   Document Released: 10/19/2005 Document Revised: 01/11/2012 Document Reviewed: 06/23/2013 Elsevier Interactive Patient Education [employer].

## 2016-04-01 ENCOUNTER — Telehealth: Payer: Self-pay | Admitting: Adult Health

## 2016-04-01 ENCOUNTER — Other Ambulatory Visit: Payer: Self-pay

## 2016-04-01 MED ORDER — DIAZEPAM 5 MG PO TABS: 5.0000 mg | ORAL_TABLET | Freq: Two times a day (BID) | ORAL | Status: DC | PRN

## 2016-04-01 NOTE — Telephone Encounter (Signed)
Ok to prescribe Valium 5 mg twice a day as needed. 60 pills, 0 refills.   Please also give her a copy of the lists of psychiatrists. She can pick these up at the front

## 2016-04-01 NOTE — Telephone Encounter (Signed)
Pt would like to know if you will prescribe her valium for her neck pain and muscle spasms? Pt went to the ED the other day for severe muscle spasms in neck and anxiety. They gave her a low dose and it really helped.  Pt would like enough to get her through until she can get in to a psychiatrist. Would also like to know if you will refer her to a psychiatrist.  Suzie Portela Erling Conte

## 2016-04-01 NOTE — Telephone Encounter (Signed)
Please advise 

## 2016-04-01 NOTE — Telephone Encounter (Signed)
Rx printed and signed & list of psychiatrists placed in envelope Patient notified that Rx is ready for pick up.

## 2016-04-14 ENCOUNTER — Other Ambulatory Visit: Payer: Self-pay | Admitting: Adult Health

## 2016-04-20 ENCOUNTER — Encounter: Payer: Self-pay | Admitting: *Deleted

## 2016-04-20 NOTE — Progress Notes (Signed)
Faxed signed orders back to [facility]: Robin gregory re: diagnosis for overnight EEG with video. Fax: [phone] Received confirmation.

## 2016-04-23 ENCOUNTER — Other Ambulatory Visit: Payer: Self-pay

## 2016-04-23 ENCOUNTER — Other Ambulatory Visit: Payer: Self-pay | Admitting: Adult Health

## 2016-04-23 DIAGNOSIS — Z1231 Encounter for screening mammogram for malignant neoplasm of breast: Secondary | ICD-10-CM

## 2016-05-08 ENCOUNTER — Ambulatory Visit: Payer: BLUE CROSS/BLUE SHIELD

## 2016-05-13 ENCOUNTER — Emergency Department (HOSPITAL_BASED_OUTPATIENT_CLINIC_OR_DEPARTMENT_OTHER)
Admission: EM | Admit: 2016-05-13 | Discharge: 2016-05-13 | Disposition: A | Discharge status: Home / Self Care | Payer: BLUE CROSS/BLUE SHIELD | Attending: Dermatology | Admitting: Dermatology

## 2016-05-13 ENCOUNTER — Encounter (HOSPITAL_BASED_OUTPATIENT_CLINIC_OR_DEPARTMENT_OTHER): Payer: Self-pay | Admitting: *Deleted

## 2016-05-13 DIAGNOSIS — I110 Hypertensive heart disease with heart failure: Secondary | ICD-10-CM | POA: Insufficient documentation

## 2016-05-13 DIAGNOSIS — M542 Cervicalgia: Secondary | ICD-10-CM | POA: Insufficient documentation

## 2016-05-13 DIAGNOSIS — I5030 Unspecified diastolic (congestive) heart failure: Secondary | ICD-10-CM | POA: Insufficient documentation

## 2016-05-13 DIAGNOSIS — Z5321 Procedure and treatment not carried out due to patient leaving prior to being seen by health care provider: Secondary | ICD-10-CM | POA: Insufficient documentation

## 2016-05-13 DIAGNOSIS — J45909 Unspecified asthma, uncomplicated: Secondary | ICD-10-CM | POA: Insufficient documentation

## 2016-05-13 NOTE — ED Notes (Signed)
Pt c/o neck pain , increased pain with movt, HX of neck pain

## 2016-05-13 NOTE — ED Notes (Signed)
Pt was called x 3 for tx area-not in ED WR

## 2016-05-18 ENCOUNTER — Ambulatory Visit (INDEPENDENT_AMBULATORY_CARE_PROVIDER_SITE_OTHER): Payer: BLUE CROSS/BLUE SHIELD | Admitting: Physician Assistant

## 2016-05-18 VITALS — BP 150/90 | HR 67 | Temp 97.9°F | Resp 18 | Ht 67.0 in | Wt 215.0 lb

## 2016-05-18 DIAGNOSIS — R0789 Other chest pain: Secondary | ICD-10-CM | POA: Diagnosis not present

## 2016-05-18 MED ORDER — NAPROXEN 500 MG PO TABS: 500.0000 mg | ORAL_TABLET | Freq: Two times a day (BID) | ORAL | Status: DC

## 2016-05-18 MED ORDER — CYCLOBENZAPRINE HCL 10 MG PO TABS: 5.0000 mg | ORAL_TABLET | Freq: Three times a day (TID) | ORAL | Status: DC | PRN

## 2016-05-18 NOTE — Progress Notes (Signed)
I was present at the time [name], [place] seen [name] and advised her to go to hospital by EMS. She refused, and decided to drive herself to [facility] ER. [name], [name], and I signed the [facility] form.

## 2016-05-18 NOTE — Patient Instructions (Signed)
     IF you received an x-ray today, you will receive an invoice from [facility]. Please contact [facility] at [phone] with questions or concerns regarding your invoice.   IF you received labwork today, you will receive an invoice from [facility]. Please contact [facility] at [phone] with questions or concerns regarding your invoice.   Our billing staff will not be able to assist you with questions regarding bills from these companies.  You will be contacted with the lab results as soon as they are available. The fastest way to get your results is to activate your My Chart account. Instructions are located on the last page of this paperwork. If you have not heard from us regarding the results in 2 weeks, please contact this office.      

## 2016-05-18 NOTE — Progress Notes (Signed)
05/18/2016 6:04 PM   DOB: 15-Sep-1961 / MRN: [id number]  SUBJECTIVE:  [name] is a 55 y.o. female presenting for chest tightness, SOB and dizziness.  She has a long history of this however she has been worse over the last two weeks.  She associates palpitations.  She has long history of atypical chest and this has been worked up by cardiology.  She did have a cath that was reportedly normal and per the patient this was three years ago.  She also complains of neck and shoulder pain.    She is allergic to hydroxychloroquine; gabapentin; hydrochlorothiazide; metoprolol; morphine and related; other; pantoprazole sodium; and prednisone.   She  has a past medical history of Hypertension; Edema; Pain in limb; Back pain; Asthma; MVP (mitral valve prolapse) (12/22/2011); S/P dilatation of esophageal stricture (12/22/2011); Positive ANA (antinuclear antibody) (01/29/2015); Lupus ([place]); Diastolic heart failure ([place]); IBS (irritable bowel syndrome); Anxiety; Syncopal episodes; TIA (transient ischemic attack); Chronic pain; Fibromyalgia; and Atypical chest pain.    She  reports that she has never smoked. She has never used smokeless tobacco. She reports that she does not drink alcohol or use illicit drugs. She  has no sexual activity history on file. The patient  has past surgical history that includes Abdominal hysterectomy; Cardiac catheterization; Tonsillectomy; Spine surgery; and Cardiac catheterization (07/2014).  Her family history includes Arthritis/Rheumatoid in her mother; COPD in her father; Coronary artery disease in her other; Diabetes in her father and mother; Emphysema in her father; Heart attack (age of onset: 39) in her father; Heart disease in her mother; Hypertension in her father and mother; Stroke in her other.  Review of Systems  Constitutional: Negative for fever and chills.  Eyes: Negative for blurred vision and double vision.  Respiratory: Positive for shortness of breath.     Cardiovascular: Positive for chest pain and palpitations. Negative for orthopnea and leg swelling.  Gastrointestinal: Negative for nausea.  Skin: Negative for itching and rash.  Neurological: Negative for dizziness and headaches.    Problem list and medications reviewed and updated by myself where necessary, and exist elsewhere in the encounter.   OBJECTIVE:  BP 150/90 mmHg  Pulse 67  Temp(Src) 97.9 F (36.6 C) (Oral)  Resp 18  Ht 5\' 7"  (1.702 m)  Wt 215 lb (97.523 kg)  BMI 33.67 kg/m2  SpO2 99%  Physical Exam  Constitutional: She is oriented to person, place, and time. She appears well-nourished.  Non-toxic appearance. She does not have a sickly appearance. She does not appear ill. No distress.  Cardiovascular: Normal rate, regular rhythm and normal heart sounds.   No murmur heard. Pulmonary/Chest: Effort normal and breath sounds normal. No respiratory distress. She exhibits no tenderness.    Abdominal: Soft. Bowel sounds are normal. There is no tenderness.  Musculoskeletal: Normal range of motion.  Neurological: She is alert and oriented to person, place, and time.  Skin: Skin is warm and dry.  Psychiatric: She has a normal mood and affect.    No results found for this or any previous visit (from the past 72 hour(s)).  No results found.  ASSESSMENT AND PLAN  Jobeth was seen today for chest pain and headache.  Diagnoses and all orders for this visit:  Other chest pain: No specific findings on EKG however she continues to have SOB, is eperiencing dizziness, and is complaining of chest tightness.  I have no choice but to send her to the ED via ambulance for enzymes.  Unfortunately patient  declined emergent transport and reports she will drive herself to [facility] ED.  She has signed AMA and that is scanned into [facility].  -     EKG 12-Lead     The patient was advised to call or return to clinic if she does not see an improvement in symptoms, or to seek the care of the closest  emergency department if she worsens with the above plan.   Philis Fendt, MHS, PA-C [facility] 05/18/2016 6:04 PM

## 2016-05-25 ENCOUNTER — Ambulatory Visit: Payer: BLUE CROSS/BLUE SHIELD

## 2016-05-30 ENCOUNTER — Other Ambulatory Visit: Payer: Self-pay | Admitting: Adult Health

## 2016-06-11 ENCOUNTER — Ambulatory Visit (INDEPENDENT_AMBULATORY_CARE_PROVIDER_SITE_OTHER): Payer: Self-pay | Admitting: Adult Health

## 2016-06-11 ENCOUNTER — Encounter: Payer: Self-pay | Admitting: Adult Health

## 2016-06-11 VITALS — BP 120/90 | Temp 98.7°F | Ht 67.0 in | Wt 219.1 lb

## 2016-06-11 DIAGNOSIS — M5030 Other cervical disc degeneration, unspecified cervical region: Secondary | ICD-10-CM

## 2016-06-11 DIAGNOSIS — I10 Essential (primary) hypertension: Secondary | ICD-10-CM

## 2016-06-11 DIAGNOSIS — E669 Obesity, unspecified: Secondary | ICD-10-CM

## 2016-06-11 MED ORDER — LORCASERIN HCL 10 MG PO TABS: 10.0000 mg | ORAL_TABLET | Freq: Two times a day (BID) | ORAL | 0 refills | Status: DC

## 2016-06-11 NOTE — Progress Notes (Signed)
Subjective:    Patient ID: [name], female    DOB: 10-25-1961, 55 y.o.   MRN: [id number]  HPI  55 year old female who presents to the office for follow up regarding hypertension. She reports that her blood pressure is much improved. Today in the office it is 120/90. She is taking medications as directed and has been trying to eat healthier.  She walks a day or two per week  She continues to be unhappy about her weight and that " no one can find out why I am having so much pain."  She was recently seen at [facility] in the [place] for chronic pain. XR of Cervical and Lumbar spine were taken, which showed ' mild to moderate degenerative disease disease throughout her neck and lumbar spine. There is nothing that would explain the large amount of symptoms she is experiencing'.   She was also referred to Neuromuscular imaging after she declined TPI due to " not liking needles"     Review of Systems  Constitutional: Positive for activity change.  HENT: Negative.   Respiratory: Negative.   Cardiovascular: Negative.   Genitourinary: Negative.   Musculoskeletal: Positive for arthralgias, back pain, joint swelling, myalgias, neck pain and neck stiffness.  Skin: Negative.   Neurological: Positive for dizziness (occassional ).  Psychiatric/Behavioral: Positive for agitation, decreased concentration and sleep disturbance. The patient is nervous/anxious.   All other systems reviewed and are negative.  Past Medical History:  Diagnosis Date  . Anxiety   . Asthma   . Atypical chest pain    in the setting of anxiety  . Back pain   . Chronic pain   . Diastolic heart failure ([place])   . Edema   . Fibromyalgia   . Hypertension   . IBS (irritable bowel syndrome)   . Lupus ([place])   . MVP (mitral valve prolapse) 12/22/2011   not confirmed on Echo 08/2015  . Pain in limb   . Positive ANA (antinuclear antibody) 01/29/2015  . S/P dilatation of esophageal stricture 12/22/2011    . Syncopal episodes   . TIA (transient ischemic attack)     Social History   Social History  . Marital status: Legally Separated    Spouse name: N/A  . Number of children: 3  . Years of education: 48   Occupational History  . 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 Smoking status: Never Smoker  . Smokeless tobacco: Never Used  . Alcohol use No  . Drug use: No  . Sexual activity: Not on file   Other Topics Concern  . Not on file   Social History Narrative   Born and raised in [place], [place]. Currently resides in an apartment with her daughter. No pets. Fun: singing/vocalist;   Denies religious beliefs that would effect health care.       Caffeine use: 1 cup every morning    Past Surgical History:  Procedure Laterality Date  . ABDOMINAL HYSTERECTOMY    . CARDIAC CATHETERIZATION    . CARDIAC CATHETERIZATION  07/2014   normal coronary arteries [facility])  . SPINE SURGERY    . TONSILLECTOMY      Family History  Problem Relation Age of Onset  . Diabetes Mother   . Hypertension Mother   . Heart disease Mother   . Diabetes Father   . Hypertension Father   . COPD Father   .  Heart attack Father 9  . Stroke Other   . Coronary artery disease Other   . Arthritis/Rheumatoid Mother   . Emphysema Father     Allergies  Allergen Reactions  . Hydroxychloroquine Other (See Comments)  . Gabapentin Other (See Comments)    Caused stroke symptoms   . Hydrochlorothiazide     cramps  . Metoprolol Swelling  . Morphine And Related Other (See Comments)    hallucinations  . Other Other (See Comments)    Other reaction(s): Other (See Comments) NARCOTICS=aggitation, hallucination Pt states allergic to "any narcotics"  . Pantoprazole Sodium Other (See Comments)    blisters  . Prednisone Other (See Comments)    Blisters on tongue    Current Outpatient Prescriptions on File Prior to Visit  Medication Sig Dispense Refill  . amitriptyline  (ELAVIL) 25 MG tablet Take 1 tablet (25 mg total) by mouth at bedtime. 30 tablet 1  . aspirin 81 MG chewable tablet Chew 81 mg by mouth daily.    . Cholecalciferol (D3 ADULT PO) Take 1 tablet by mouth daily.    . Cyanocobalamin (B-12 PO) Take 2 tablets by mouth daily. Reported on 05/18/2016    . enalapril (VASOTEC) 20 MG tablet TAKE TWO TABLETS BY MOUTH ONCE DAILY 60 tablet 0  . furosemide (LASIX) 40 MG tablet Take 1 tablet by mouth daily.  2  . hydrochlorothiazide (HYDRODIURIL) 25 MG tablet Take 1 tablet (25 mg total) by mouth daily. 90 tablet 3  . LORazepam (ATIVAN) 1 MG tablet Take 1 tablet (1 mg total) by mouth 2 (two) times daily. 60 tablet 0   No current facility-administered medications on file prior to visit.     BP 120/90   Temp 98.7 F (37.1 C) (Oral)   Ht 5\' 7"  (1.702 m)   Wt 219 lb 2 oz (99.4 kg)   BMI 34.32 kg/m       Objective:   Physical Exam  Constitutional: She is oriented to person, place, and time. She appears well-developed and well-nourished. No distress.  Cardiovascular: Normal rate, regular rhythm, normal heart sounds and intact distal pulses.  Exam reveals no gallop and no friction rub.   No murmur heard. Pulmonary/Chest: Effort normal and breath sounds normal. No respiratory distress. She has no wheezes. She has no rales. She exhibits no tenderness.  Neurological: She is alert and oriented to person, place, and time.  Skin: Skin is warm and dry. No rash noted. She is not diaphoretic. No erythema. No pallor.  Psychiatric: She has a normal mood and affect. Her behavior is normal. Judgment and thought content normal.  Nursing note and vitals reviewed.    Assessment & Plan:  1. Essential hypertension - After a long battle to get her blood pressures down, it appears as though she is finally at goal.  - Continue with current therapy - Continue to monitor at home. Return precautions given  - Continue to work on diet and exercise  2. DDD (degenerative disc  disease), cervical - explained to patient how important it is to follow up with her specialists. She has been worked up initially by multiple providers but becomes frustrated that " no cure" has been found in the first visit and then never goes back. It is likely she will always have chronic pain from back surgery and fibromyalgia diagnoses - Follow up with Pain Management  - Go to scheduled PT appointments  3. Obesity - Will trial Belviq - Lorcaserin HCl 10 MG TABS; Take 10  mg by mouth 2 (two) times daily.  Dispense: 60 tablet; Refill: 0 - She needs to continue to eat healthy and increase aerobic exercise.  - I will see her back in one month   Wt Readings from Last 3 Encounters:  06/11/16 219 lb 2 oz (99.4 kg)  05/18/16 215 lb (97.5 kg)  05/13/16 215 lb (97.5 kg)     Patient had very complex case with many questions requiring extra time at bedside. I spent 60 minutes with patient; more than 50% of the time was spent counseling patient and arranging treatment.    Dorothyann Peng, NP

## 2016-06-11 NOTE — Progress Notes (Signed)
Pre visit review using our clinic review tool, if applicable. No additional management support is needed unless otherwise documented below in the visit note. 

## 2016-07-08 ENCOUNTER — Emergency Department (HOSPITAL_BASED_OUTPATIENT_CLINIC_OR_DEPARTMENT_OTHER): Payer: Self-pay

## 2016-07-08 ENCOUNTER — Emergency Department (HOSPITAL_BASED_OUTPATIENT_CLINIC_OR_DEPARTMENT_OTHER)
Admission: EM | Admit: 2016-07-08 | Discharge: 2016-07-08 | Disposition: A | Discharge status: Home / Self Care | Payer: Self-pay | Attending: Emergency Medicine | Admitting: Emergency Medicine

## 2016-07-08 ENCOUNTER — Encounter (HOSPITAL_BASED_OUTPATIENT_CLINIC_OR_DEPARTMENT_OTHER): Payer: Self-pay | Admitting: *Deleted

## 2016-07-08 DIAGNOSIS — M542 Cervicalgia: Secondary | ICD-10-CM | POA: Insufficient documentation

## 2016-07-08 DIAGNOSIS — I110 Hypertensive heart disease with heart failure: Secondary | ICD-10-CM | POA: Insufficient documentation

## 2016-07-08 DIAGNOSIS — J45909 Unspecified asthma, uncomplicated: Secondary | ICD-10-CM | POA: Insufficient documentation

## 2016-07-08 DIAGNOSIS — M25511 Pain in right shoulder: Secondary | ICD-10-CM | POA: Insufficient documentation

## 2016-07-08 DIAGNOSIS — Z79899 Other long term (current) drug therapy: Secondary | ICD-10-CM | POA: Insufficient documentation

## 2016-07-08 DIAGNOSIS — M25512 Pain in left shoulder: Secondary | ICD-10-CM

## 2016-07-08 DIAGNOSIS — I5030 Unspecified diastolic (congestive) heart failure: Secondary | ICD-10-CM | POA: Insufficient documentation

## 2016-07-08 DIAGNOSIS — F22 Delusional disorders: Secondary | ICD-10-CM | POA: Insufficient documentation

## 2016-07-08 DIAGNOSIS — Z7982 Long term (current) use of aspirin: Secondary | ICD-10-CM | POA: Insufficient documentation

## 2016-07-08 NOTE — ED Triage Notes (Signed)
Pt c/o right shoulder pain which radiates down left arm x 2 weeks

## 2016-07-08 NOTE — Discharge Instructions (Signed)
Please follow-up with orthopedic specialist for further evaluation and management.

## 2016-07-08 NOTE — ED Provider Notes (Signed)
[facility] Provider Note   CSN: [id number] Arrival date & time: 07/08/16  2058  By signing my name below, I, Shanna Cisco, attest that this documentation has been prepared under the direction and in the presence of [employer], PA-C. Electronically signed by: Shanna Cisco, ED Scribe. 07/08/16. 9:24 PM.  History   Chief Complaint Chief Complaint  Patient presents with  . Shoulder Pain   The history is provided by the patient. No language interpreter was used.   HPI Comments:  [name] is a 55 y.o. female who presents to the Emergency Department complaining of right shoulder pain, which started since lower back surgery 4 years ago. Pain has gradually worsened over the past two weeks and radiates down right arm. Characterized as a burning sensation. Localized to both shoulders, but worse on right. Associated symptoms warmth in finger, neck pain and decreased ROM in right shoulder secondary to pain. Denies numbness or tingling in arms. Pt has previously seen a neurosurgeon, but has not found relief.   Past Medical History:  Diagnosis Date  . Anxiety   . Asthma   . Atypical chest pain    in the setting of anxiety  . Back pain   . Chronic pain   . Diastolic heart failure ([place])   . Edema   . Fibromyalgia   . Hypertension   . IBS (irritable bowel syndrome)   . Lupus ([place])   . MVP (mitral valve prolapse) 12/22/2011   not confirmed on Echo 08/2015  . Pain in limb   . Positive ANA (antinuclear antibody) 01/29/2015  . S/P dilatation of esophageal stricture 12/22/2011  . Syncopal episodes   . TIA (transient ischemic attack)     Patient Active Problem List   Diagnosis Date Noted  . Cervical myofascial pain syndrome 01/08/2016  . CTS (carpal tunnel syndrome) 12/05/2015  . Radiculopathy of lumbosacral region 12/05/2015  . Morning headache 08/29/2015  . Daytime somnolence 08/29/2015  . Obesity 08/29/2015  . Snoring 08/29/2015  . Paresthesias 08/29/2015  .  Convulsions/seizures ([place]) 08/29/2015  . Altered awareness, transient   . Cervical paraspinal muscle spasm 08/03/2015  . Other specified transient cerebral ischemias   . Essential hypertension   . Noncompliance with medication regimen   . TIA (transient ischemic attack) 08/02/2015  . Leg swelling 01/29/2015  . Positive ANA (antinuclear antibody) 01/29/2015  . Dizzy spells 01/01/2015  . Shortness of breath 11/20/2014  . DDD (degenerative disc disease), cervical 11/09/2013  . Generalized anxiety disorder 12/27/2012  . 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 Allergic rhinitis 11/27/2012  . Hyperlipidemia 12/22/2011  . MVP (mitral valve prolapse) 12/22/2011  . S/P dilatation of esophageal stricture 12/22/2011  . Atypical chest pain 08/19/2011    Past Surgical History:  Procedure Laterality Date  . ABDOMINAL HYSTERECTOMY    . CARDIAC CATHETERIZATION    . CARDIAC CATHETERIZATION  07/2014   normal coronary arteries [facility])  . SPINE SURGERY    . TONSILLECTOMY      OB History    No data available       Home Medications    Prior to Admission medications   Medication Sig Start Date End Date Taking? Authorizing Provider  amitriptyline (ELAVIL) 25 MG tablet Take 1 tablet (25 mg total) by mouth at bedtime. 01/07/16   Dorothyann Peng, NP  aspirin 81 MG chewable tablet Chew 81 mg by mouth daily.    Historical Provider, MD  Cholecalciferol (D3 ADULT PO) Take 1 tablet by mouth daily.  Historical Provider, MD  Cyanocobalamin (B-12 PO) Take 2 tablets by mouth daily. Reported on 05/18/2016    Historical Provider, MD  enalapril (VASOTEC) 20 MG tablet TAKE TWO TABLETS BY MOUTH ONCE DAILY 06/01/16   Dorothyann Peng, NP  furosemide (LASIX) 40 MG tablet Take 1 tablet by mouth daily. 03/23/16   Historical Provider, MD  hydrochlorothiazide (HYDRODIURIL) 25 MG tablet Take 1 tablet (25 mg total) by mouth daily. 02/11/16   Dorothyann Peng, NP  LORazepam (ATIVAN) 1 MG tablet Take 1 tablet (1 mg total) by mouth 2  (two) times daily. 12/02/15   Dorothyann Peng, NP  Lorcaserin HCl 10 MG TABS Take 10 mg by mouth 2 (two) times daily. 06/11/16   Dorothyann Peng, NP    Family History Family History  Problem Relation Age of Onset  . Diabetes Mother   . Hypertension Mother   . Heart disease Mother   . Arthritis/Rheumatoid Mother   . Diabetes Father   . Hypertension Father   . COPD Father   . Heart attack Father 60  . Emphysema Father   . Stroke Other   . Coronary artery disease Other     Social History Social History  Substance Use Topics  . Smoking status: Never Smoker  . Smokeless tobacco: Never Used  . Alcohol use No     Allergies   Hydroxychloroquine; Gabapentin; Hydrochlorothiazide; Metoprolol; Morphine and related; Other; Pantoprazole sodium; and Prednisone   Review of Systems Review of Systems  Musculoskeletal: Positive for arthralgias and myalgias.  Neurological: Negative for weakness and numbness.  All other systems reviewed and are negative.    Physical Exam Updated Vital Signs BP 158/100   Pulse 80   Temp 97.9 F (36.6 C) (Oral)   Resp 16   Ht 5\' 7"  (1.702 m)   Wt 98.9 kg   SpO2 96%   BMI 34.14 kg/m   Physical Exam  Constitutional: She is oriented to person, place, and time. She appears well-developed and well-nourished.  HENT:  Head: Normocephalic and atraumatic.  Mouth/Throat: Oropharynx is clear and moist.  Eyes: Conjunctivae and EOM are normal. Pupils are equal, round, and reactive to light.  Neck: Normal range of motion.  Cardiovascular: Normal rate, regular rhythm and normal heart sounds.   Pulses:      Radial pulses are 2+ on the right side, and 2+ on the left side.  Pulmonary/Chest: Effort normal and breath sounds normal.  Abdominal: Soft. Bowel sounds are normal.  Musculoskeletal: She exhibits tenderness.  Shoulders equal bilaterally. TTP in right shoulder Pain in all ROM; pt unable to range shoulder through complete ROM due to pain. Distal sensation  and strength intact. Grip strength 5/5.  Neurological: She is alert and oriented to person, place, and time.  Skin: Skin is warm and dry.  Psychiatric: She has a normal mood and affect.  Nursing note and vitals reviewed.    ED Treatments / Results  DIAGNOSTIC STUDIES:  Oxygen Saturation is 96% on room air, normal by my interpretation.    COORDINATION OF CARE:  9:23 PM Discussed treatment plan with pt at bedside and pt agreed to plan.  Labs (all labs ordered are listed, but only abnormal results are displayed) Labs Reviewed - No data to display  EKG  EKG Interpretation None       Radiology Dg Shoulder Right  Result Date: 07/08/2016 CLINICAL DATA:  Bilat shoulder pain x many yrs, increased rt shoulder pain x 2 wks, rt neck pain extending down rt arm  to fingers, limited ROM, no trauma or surgery EXAM: RIGHT SHOULDER - 2+ VIEW COMPARISON:  06/30/2015 FINDINGS: There is no evidence of fracture or dislocation. There is no evidence of arthropathy or other focal bone abnormality. Soft tissues are unremarkable. IMPRESSION: Negative. Electronically Signed   By: Van Clines M.D.   On: 07/08/2016 22:26    Procedures Procedures (including critical care time)  Medications Ordered in ED Medications - No data to display   Initial Impression / Assessment and Plan / ED Course  I have reviewed the triage vital signs and the nursing notes.  Pertinent labs & imaging results that were available during my care of the patient were reviewed by me and considered in my medical decision making (see chart for details).  Clinical Course    Labs:   Imaging: X-ray of right shoulder.  Consults:   Therapeutics:   Discharge Meds:  Assessment/Plan: 55 year old female presents today with right shoulder pain. Patient has no infectious etiology, no signs of trauma. She is requesting an x-ray. X-ray negative. She has no neurological deficits. Patient encouraged follow-up with orthopedics,  and strict return precautions given.   Pt verbalized understanding and agreement to today's plan and had no further questions at discharge.  Final Clinical Impressions(s) / ED Diagnoses   Final diagnoses:  "Walking corpse" syndrome  Left shoulder pain    New Prescriptions Discharge Medication List as of 07/08/2016 10:40 PM    I personally performed the services described in this documentation, which was scribed in my presence. The recorded information has been reviewed and is accurate.     Okey Regal, PA-C 07/08/16 2320    Tanna Furry, MD 07/19/16 [phone]

## 2016-07-18 ENCOUNTER — Other Ambulatory Visit: Payer: Self-pay | Admitting: Adult Health

## 2016-07-19 ENCOUNTER — Other Ambulatory Visit: Payer: Self-pay | Admitting: Adult Health

## 2016-07-19 MED ORDER — ENALAPRIL MALEATE 20 MG PO TABS: 40.0000 mg | ORAL_TABLET | Freq: Every day | ORAL | 3 refills | Status: DC

## 2016-07-28 ENCOUNTER — Emergency Department (HOSPITAL_BASED_OUTPATIENT_CLINIC_OR_DEPARTMENT_OTHER): Payer: Self-pay

## 2016-07-28 ENCOUNTER — Emergency Department (HOSPITAL_BASED_OUTPATIENT_CLINIC_OR_DEPARTMENT_OTHER)
Admission: EM | Admit: 2016-07-28 | Discharge: 2016-07-29 | Disposition: A | Discharge status: Home / Self Care | Payer: Self-pay | Attending: Emergency Medicine | Admitting: Emergency Medicine

## 2016-07-28 ENCOUNTER — Encounter (HOSPITAL_BASED_OUTPATIENT_CLINIC_OR_DEPARTMENT_OTHER): Payer: Self-pay | Admitting: *Deleted

## 2016-07-28 DIAGNOSIS — J45909 Unspecified asthma, uncomplicated: Secondary | ICD-10-CM | POA: Insufficient documentation

## 2016-07-28 DIAGNOSIS — Z7982 Long term (current) use of aspirin: Secondary | ICD-10-CM | POA: Insufficient documentation

## 2016-07-28 DIAGNOSIS — R51 Headache: Secondary | ICD-10-CM | POA: Insufficient documentation

## 2016-07-28 DIAGNOSIS — I160 Hypertensive urgency: Secondary | ICD-10-CM | POA: Insufficient documentation

## 2016-07-28 DIAGNOSIS — I5030 Unspecified diastolic (congestive) heart failure: Secondary | ICD-10-CM | POA: Insufficient documentation

## 2016-07-28 DIAGNOSIS — I110 Hypertensive heart disease with heart failure: Secondary | ICD-10-CM | POA: Insufficient documentation

## 2016-07-28 DIAGNOSIS — Z79899 Other long term (current) drug therapy: Secondary | ICD-10-CM | POA: Insufficient documentation

## 2016-07-28 LAB — RAPID URINE DRUG SCREEN, HOSP PERFORMED
AMPHETAMINES: NOT DETECTED
BENZODIAZEPINES: NOT DETECTED
Barbiturates: NOT DETECTED
COCAINE: NOT DETECTED
Opiates: NOT DETECTED
Tetrahydrocannabinol: NOT DETECTED

## 2016-07-28 LAB — CBC
HCT: 39 % (ref 36.0–46.0)
Hemoglobin: 13.1 g/dL (ref 12.0–15.0)
MCH: 29.8 pg (ref 26.0–34.0)
MCHC: 33.6 g/dL (ref 30.0–36.0)
MCV: 88.6 fL (ref 78.0–100.0)
PLATELETS: 186 10*3/uL (ref 150–400)
RBC: 4.4 MIL/uL (ref 3.87–5.11)
RDW: 12.5 % (ref 11.5–15.5)
WBC: 6.5 10*3/uL (ref 4.0–10.5)

## 2016-07-28 LAB — COMPREHENSIVE METABOLIC PANEL
ALBUMIN: 4.2 g/dL (ref 3.5–5.0)
ALK PHOS: 83 U/L (ref 38–126)
ALT: 15 U/L (ref 14–54)
ANION GAP: 6 (ref 5–15)
AST: 20 U/L (ref 15–41)
BILIRUBIN TOTAL: 0.3 mg/dL (ref 0.3–1.2)
BUN: 12 mg/dL (ref 6–20)
CALCIUM: 9.4 mg/dL (ref 8.9–10.3)
CO2: 29 mmol/L (ref 22–32)
Chloride: 105 mmol/L (ref 101–111)
Creatinine, Ser: 0.75 mg/dL (ref 0.44–1.00)
GFR calc non Af Amer: >60 mL/min (ref >60)
GLUCOSE: 107 mg/dL — AB (ref 65–99)
POTASSIUM: 4.4 mmol/L (ref 3.5–5.1)
SODIUM: 140 mmol/L (ref 135–145)
TOTAL PROTEIN: 7.2 g/dL (ref 6.5–8.1)

## 2016-07-28 LAB — DIFFERENTIAL
Basophils Absolute: 0 10*3/uL (ref 0.0–0.1)
Basophils Relative: 1 %
EOS ABS: 0.2 10*3/uL (ref 0.0–0.7)
EOS PCT: 3 %
Lymphocytes Relative: 38 %
Lymphs Abs: 2.5 10*3/uL (ref 0.7–4.0)
MONO ABS: 0.4 10*3/uL (ref 0.1–1.0)
Monocytes Relative: 7 %
NEUTROS PCT: 51 %
Neutro Abs: 3.4 10*3/uL (ref 1.7–7.7)

## 2016-07-28 LAB — URINALYSIS, ROUTINE W REFLEX MICROSCOPIC
BILIRUBIN URINE: NEGATIVE
GLUCOSE, UA: NEGATIVE mg/dL
HGB URINE DIPSTICK: NEGATIVE
Ketones, ur: NEGATIVE mg/dL
Leukocytes, UA: NEGATIVE
Nitrite: NEGATIVE
Protein, ur: NEGATIVE mg/dL
SPECIFIC GRAVITY, URINE: 1.002 — AB (ref 1.005–1.030)
pH: 7 (ref 5.0–8.0)

## 2016-07-28 LAB — PROTIME-INR
INR: 0.85
PROTHROMBIN TIME: 11.6 s (ref 11.4–15.2)

## 2016-07-28 LAB — ETHANOL

## 2016-07-28 LAB — CBG MONITORING, ED: Glucose-Capillary: 91 mg/dL (ref 65–99)

## 2016-07-28 LAB — APTT: aPTT: 35 seconds (ref 24–36)

## 2016-07-28 LAB — TROPONIN I

## 2016-07-28 MED ORDER — NITROGLYCERIN 0.4 MG SL SUBL
0.4000 mg | SUBLINGUAL_TABLET | SUBLINGUAL | Status: DC | PRN
  Administered 2016-07-28: 0.4 mg via SUBLINGUAL
  Filled 2016-07-28: qty 1

## 2016-07-28 MED ORDER — ASPIRIN 81 MG PO CHEW
324.0000 mg | CHEWABLE_TABLET | Freq: Once | ORAL | Status: AC
  Administered 2016-07-28: 324 mg via ORAL
  Filled 2016-07-28: qty 4

## 2016-07-28 MED ORDER — ACETAMINOPHEN 325 MG PO TABS
650.0000 mg | ORAL_TABLET | Freq: Once | ORAL | Status: AC
  Administered 2016-07-28: 650 mg via ORAL
  Filled 2016-07-28: qty 2

## 2016-07-28 MED ORDER — SODIUM CHLORIDE 0.9 % IV BOLUS (SEPSIS): 1000.0000 mL | Freq: Once | INTRAVENOUS | Status: DC

## 2016-07-28 NOTE — ED Triage Notes (Signed)
Pt reports that she has been out of her BP meds x several days.  pts daughter reports pt started acting confused about 15 minutes PTA.  Pt is unsteady on her feet but is ambulatory.  Pt slow to answer question.  Pt reports neck pain, chest pain and 'feeling crazy'

## 2016-07-28 NOTE — ED Notes (Signed)
After nitro dose, pt c/o worsening anxiety.  Denies current headache, sitting up in bed watching tv.

## 2016-07-28 NOTE — ED Notes (Signed)
Patient transported to CT 

## 2016-07-28 NOTE — ED Provider Notes (Signed)
[facility] DEPT [facility] Provider Note   CSN: [id number] Arrival date & time: 07/28/16  2129  By signing my name below, I, [name], attest that this documentation has been prepared under the direction and in the presence of Sherwood Gambler, MD . Electronically Signed: Jeanell Brady, Scribe. 07/28/2016. 10:11 PM.  History   Chief Complaint Chief Complaint  Patient presents with  . Altered Mental Status   The history is provided by the patient. No language interpreter was used.   HPI Comments: [name] is a 55 y.o. female who presents to the Emergency Department complaining of constant moderate frontal headache that started today. She ran out of her enalapril a few days ago and started having high blood pressure today. She then started having gradually worsening dizziness and chest tightness. Also has BLE swelling. Denies any visual disturbance or unilateral weakness.  While she was driving she had worsening symptoms and had to pull over. Daughter felt like she was confused. Symptoms are improving since she took the enalapril. No slurred speech.  Past Medical History:  Diagnosis Date  . Anxiety   . Asthma   . Atypical chest pain    in the setting of anxiety  . Back pain   . Chronic pain   . Diastolic heart failure ([place])   . Edema   . Fibromyalgia   . Hypertension   . IBS (irritable bowel syndrome)   . Lupus ([place])   . MVP (mitral valve prolapse) 12/22/2011   not confirmed on Echo 08/2015  . Pain in limb   . Positive ANA (antinuclear antibody) 01/29/2015  . S/P dilatation of esophageal stricture 12/22/2011  . Syncopal episodes   . TIA (transient ischemic attack)     Patient Active Problem List   Diagnosis Date Noted  . Cervical myofascial pain syndrome 01/08/2016  . CTS (carpal tunnel syndrome) 12/05/2015  . Radiculopathy of lumbosacral region 12/05/2015  . Morning headache 08/29/2015  . Daytime somnolence 08/29/2015  . Obesity 08/29/2015  . Snoring 08/29/2015  .  Paresthesias 08/29/2015  . Convulsions/seizures ([place]) 08/29/2015  . Altered awareness, transient   . Cervical paraspinal muscle spasm 08/03/2015  . Other specified transient cerebral ischemias   . Essential hypertension   . Noncompliance with medication regimen   . TIA (transient ischemic attack) 08/02/2015  . Leg swelling 01/29/2015  . Positive ANA (antinuclear antibody) 01/29/2015  . Dizzy spells 01/01/2015  . Shortness of breath 11/20/2014  . DDD (degenerative disc disease), cervical 11/09/2013  . Generalized anxiety disorder 12/27/2012  . 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 Allergic rhinitis 11/27/2012  . Hyperlipidemia 12/22/2011  . MVP (mitral valve prolapse) 12/22/2011  . S/P dilatation of esophageal stricture 12/22/2011  . Atypical chest pain 08/19/2011    Past Surgical History:  Procedure Laterality Date  . ABDOMINAL HYSTERECTOMY    . CARDIAC CATHETERIZATION    . CARDIAC CATHETERIZATION  07/2014   normal coronary arteries [facility])  . SPINE SURGERY    . TONSILLECTOMY      OB History    No data available       Home Medications    Prior to Admission medications   Medication Sig Start Date End Date Taking? Authorizing Provider  amitriptyline (ELAVIL) 25 MG tablet Take 1 tablet (25 mg total) by mouth at bedtime. 01/07/16   Dorothyann Peng, NP  aspirin 81 MG chewable tablet Chew 81 mg by mouth daily.    Historical Provider, MD  Cholecalciferol (D3 ADULT PO) Take 1 tablet by mouth  daily.    Historical Provider, MD  Cyanocobalamin (B-12 PO) Take 2 tablets by mouth daily. Reported on 05/18/2016    Historical Provider, MD  enalapril (VASOTEC) 20 MG tablet Take 2 tablets (40 mg total) by mouth daily. 07/19/16   Dorothyann Peng, NP  furosemide (LASIX) 40 MG tablet Take 1 tablet by mouth daily. 03/23/16   Historical Provider, MD  hydrochlorothiazide (HYDRODIURIL) 25 MG tablet Take 1 tablet (25 mg total) by mouth daily. 02/11/16   Dorothyann Peng, NP  LORazepam (ATIVAN) 1 MG tablet Take  1 tablet (1 mg total) by mouth 2 (two) times daily. 12/02/15   Dorothyann Peng, NP  Lorcaserin HCl 10 MG TABS Take 10 mg by mouth 2 (two) times daily. 06/11/16   Dorothyann Peng, NP    Family History Family History  Problem Relation Age of Onset  . Diabetes Mother   . Hypertension Mother   . Heart disease Mother   . Arthritis/Rheumatoid Mother   . Diabetes Father   . Hypertension Father   . COPD Father   . Heart attack Father 76  . Emphysema Father   . Stroke Other   . Coronary artery disease Other     Social History Social History  Substance Use Topics  . Smoking status: Never Smoker  . Smokeless tobacco: Never Used  . Alcohol use No     Allergies   Hydroxychloroquine; Gabapentin; Hydrochlorothiazide; Metoprolol; Morphine and related; Other; Pantoprazole sodium; and Prednisone   Review of Systems Review of Systems  Eyes: Negative for visual disturbance.  Respiratory: Positive for chest tightness.   Neurological: Positive for dizziness. Negative for weakness.     Physical Exam Updated Vital Signs BP (!) 209/110 (BP Location: Left Arm)   Pulse (!) 56   Temp 97.6 F (36.4 C) (Oral)   Resp 18   Ht 5\' 7"  (1.702 m)   Wt 215 lb (97.5 kg)   SpO2 100%   BMI 33.67 kg/m   Physical Exam  Constitutional: She is oriented to person, place, and time. She appears well-developed and well-nourished.  HENT:  Head: Normocephalic and atraumatic.  Right Ear: External ear normal.  Left Ear: External ear normal.  Nose: Nose normal.  Eyes: EOM are normal. Pupils are equal, round, and reactive to light. Right eye exhibits no discharge. Left eye exhibits no discharge.  Normal visual fields.   Cardiovascular: Normal rate, regular rhythm and normal heart sounds.   Pulmonary/Chest: Effort normal and breath sounds normal.  Abdominal: Soft. There is no tenderness.  Neurological: She is alert and oriented to person, place, and time.  CN 3-12 grossly intact. 5/5 strength in all 4  extremities. Grossly normal sensation. Normal finger to nose.   Skin: Skin is warm and dry.  Psychiatric: Her mood appears anxious.  Nursing note and vitals reviewed.    ED Treatments / Results  DIAGNOSTIC STUDIES: Oxygen Saturation is 100% on RA, normal by my interpretation.    COORDINATION OF CARE: 10:15 PM- Pt advised of plan for treatment and pt agrees.  Labs (all labs ordered are listed, but only abnormal results are displayed) Labs Reviewed  COMPREHENSIVE METABOLIC PANEL - Abnormal; Notable for the following:       Result Value   Glucose, Bld 107 (*)    All other components within normal limits  URINALYSIS, ROUTINE W REFLEX MICROSCOPIC (NOT AT [facility]) - Abnormal; Notable for the following:    Specific Gravity, Urine 1.002 (*)    All other components within normal limits  ETHANOL  PROTIME-INR  APTT  CBC  DIFFERENTIAL  URINE RAPID DRUG SCREEN, HOSP PERFORMED  TROPONIN I  CBG MONITORING, ED    EKG  EKG Interpretation  Date/Time:  Tuesday July 28 2016 21:39:26 EDT Ventricular Rate:  56 PR Interval:  162 QRS Duration: 100 QT Interval:  458 QTC Calculation: 441 R Axis:   -12 Text Interpretation:  Sinus bradycardia Minimal voltage criteria for LVH, may be normal variant Borderline ECG no significant change since May 2017 Confirmed by Regenia Skeeter MD, Atasha Colebank [phone]) on 07/28/2016 9:46:18 PM       Radiology Ct Head Wo Contrast  Result Date: 07/28/2016 CLINICAL DATA:  Acute onset of headache, dizziness and blurred vision. Initial encounter. EXAM: CT HEAD WITHOUT CONTRAST TECHNIQUE: Contiguous axial images were obtained from the base of the skull through the vertex without intravenous contrast. COMPARISON:  CT of the head and MRI of the brain performed 08/02/2015 FINDINGS: Brain: No evidence of acute infarction, hemorrhage, hydrocephalus, extra-axial collection or mass lesion/mass effect. The posterior fossa, including the cerebellum, brainstem and fourth ventricle, is  within normal limits. The third and lateral ventricles, and basal ganglia are unremarkable in appearance. The cerebral hemispheres are symmetric in appearance, with normal gray-white differentiation. No mass effect or midline shift is seen. Vascular: No hyperdense vessel or unexpected calcification. Skull: There is no evidence of fracture; visualized osseous structures are unremarkable in appearance. Sinuses/Orbits: The visualized portions of the orbits are within normal limits. The paranasal sinuses and mastoid air cells are well-aerated. Other: No significant soft tissue abnormalities are seen. IMPRESSION: Unremarkable noncontrast CT of the head. Electronically Signed   By: Garald Balding M.D.   On: 07/28/2016 22:13    Procedures Procedures (including critical care time)  Medications Ordered in ED Medications  nitroGLYCERIN (NITROSTAT) SL tablet 0.4 mg (0.4 mg Sublingual Given 07/28/16 2245)  sodium chloride 0.9 % bolus 1,000 mL (0 mLs Intravenous Hold 07/28/16 2351)  aspirin chewable tablet 324 mg (324 mg Oral Given 07/28/16 2241)  acetaminophen (TYLENOL) tablet 650 mg (650 mg Oral Given 07/28/16 2242)     Initial Impression / Assessment and Plan / ED Course  I have reviewed the triage vital signs and the nursing notes.  Pertinent labs & imaging results that were available during my care of the patient were reviewed by me and considered in my medical decision making (see chart for details).  Clinical Course    Patient's symptoms are probably from hypertensive urgency. Initial BP 210/100. As BP improved so did her symptoms. Initially I discussed with neuro, Dr. Leonel Ramsay, who feels this is not c/w CVA, likely HTN. I agree, no code stroke. Given she is essentially asymptomatic now, I do not think admission and/or further testing is needed. I recommended a 2nd troponin given the associated chest tightness but she declines. She understands we could be missing MI and resultant possible downstream  effects. Wants to come back if symptoms worsen, otherwise f/u with PCP tomorrow. At time of d/c, her CXR is still pending radiology read. She does not want to wait longer and radiology is currently backed up. I do not see anything emergent on my view, but I discussed waiting for radiology read but she wants to go. Discussed strict return precautions.  Final Clinical Impressions(s) / ED Diagnoses   Final diagnoses:  Hypertensive urgency    New Prescriptions Discharge Medication List as of 07/29/2016 12:01 AM     I personally performed the services described in this documentation, which was scribed  in my presence. The recorded information has been reviewed and is accurate.     Sherwood Gambler, MD 07/29/16 [phone]

## 2016-07-28 NOTE — ED Triage Notes (Signed)
Pt has no drift, normal sensation, no droop.

## 2016-07-29 NOTE — ED Notes (Signed)
Pt verbalizes understanding of d/c instructions and denies any further needs at this time. 

## 2016-11-11 ENCOUNTER — Other Ambulatory Visit: Payer: Self-pay

## 2016-11-11 ENCOUNTER — Encounter: Payer: Self-pay | Admitting: Family Medicine

## 2016-11-11 ENCOUNTER — Encounter: Payer: Self-pay | Admitting: Licensed Clinical Social Worker

## 2016-11-11 ENCOUNTER — Ambulatory Visit: Payer: Self-pay | Attending: Family Medicine | Admitting: Family Medicine

## 2016-11-11 VITALS — BP 166/102 | HR 61 | Temp 98.1°F | Resp 18 | Ht 66.0 in | Wt 215.8 lb

## 2016-11-11 DIAGNOSIS — H547 Unspecified visual loss: Secondary | ICD-10-CM

## 2016-11-11 DIAGNOSIS — I341 Nonrheumatic mitral (valve) prolapse: Secondary | ICD-10-CM | POA: Insufficient documentation

## 2016-11-11 DIAGNOSIS — I5030 Unspecified diastolic (congestive) heart failure: Secondary | ICD-10-CM

## 2016-11-11 DIAGNOSIS — Z8739 Personal history of other diseases of the musculoskeletal system and connective tissue: Secondary | ICD-10-CM

## 2016-11-11 DIAGNOSIS — R002 Palpitations: Secondary | ICD-10-CM | POA: Insufficient documentation

## 2016-11-11 DIAGNOSIS — Z79899 Other long term (current) drug therapy: Secondary | ICD-10-CM | POA: Insufficient documentation

## 2016-11-11 DIAGNOSIS — R635 Abnormal weight gain: Secondary | ICD-10-CM

## 2016-11-11 DIAGNOSIS — M62838 Other muscle spasm: Secondary | ICD-10-CM

## 2016-11-11 DIAGNOSIS — Z7982 Long term (current) use of aspirin: Secondary | ICD-10-CM | POA: Insufficient documentation

## 2016-11-11 DIAGNOSIS — I10 Essential (primary) hypertension: Secondary | ICD-10-CM

## 2016-11-11 DIAGNOSIS — I110 Hypertensive heart disease with heart failure: Secondary | ICD-10-CM | POA: Insufficient documentation

## 2016-11-11 DIAGNOSIS — G8929 Other chronic pain: Secondary | ICD-10-CM | POA: Insufficient documentation

## 2016-11-11 DIAGNOSIS — M549 Dorsalgia, unspecified: Secondary | ICD-10-CM | POA: Insufficient documentation

## 2016-11-11 DIAGNOSIS — M797 Fibromyalgia: Secondary | ICD-10-CM

## 2016-11-11 DIAGNOSIS — M5430 Sciatica, unspecified side: Secondary | ICD-10-CM | POA: Insufficient documentation

## 2016-11-11 DIAGNOSIS — Z87898 Personal history of other specified conditions: Secondary | ICD-10-CM

## 2016-11-11 DIAGNOSIS — F411 Generalized anxiety disorder: Secondary | ICD-10-CM

## 2016-11-11 LAB — CBC WITH DIFFERENTIAL/PLATELET
BASOS ABS: 59 {cells}/uL (ref 0–200)
BASOS PCT: 1 %
EOS ABS: 177 {cells}/uL (ref 15–500)
EOS PCT: 3 %
HCT: 40.2 % (ref 35.0–45.0)
Hemoglobin: 13.4 g/dL (ref 11.7–15.5)
LYMPHS PCT: 34 %
Lymphs Abs: 2006 cells/uL (ref 850–3900)
MCH: 30 pg (ref 27.0–33.0)
MCHC: 33.3 g/dL (ref 32.0–36.0)
MCV: 90.1 fL (ref 80.0–100.0)
MPV: 10.3 fL (ref 7.5–12.5)
Monocytes Absolute: 354 cells/uL (ref 200–950)
Monocytes Relative: 6 %
Neutro Abs: 3304 cells/uL (ref 1500–7800)
Neutrophils Relative %: 56 %
PLATELETS: 213 10*3/uL (ref 140–400)
RBC: 4.46 MIL/uL (ref 3.80–5.10)
RDW: 13.5 % (ref 11.0–15.0)
WBC: 5.9 10*3/uL (ref 3.8–10.8)

## 2016-11-11 LAB — BASIC METABOLIC PANEL
BUN: 12 mg/dL (ref 7–25)
CALCIUM: 9.5 mg/dL (ref 8.6–10.4)
CHLORIDE: 103 mmol/L (ref 98–110)
CO2: 29 mmol/L (ref 20–31)
Creat: 0.82 mg/dL (ref 0.50–1.05)
Glucose, Bld: 88 mg/dL (ref 65–99)
Potassium: 4.1 mmol/L (ref 3.5–5.3)
Sodium: 141 mmol/L (ref 135–146)

## 2016-11-11 LAB — TSH: TSH: 0.61 m[IU]/L

## 2016-11-11 MED ORDER — CYCLOBENZAPRINE HCL 10 MG PO TABS: 10.0000 mg | ORAL_TABLET | Freq: Three times a day (TID) | ORAL | 0 refills | Status: DC | PRN

## 2016-11-11 MED ORDER — AMITRIPTYLINE HCL 10 MG PO TABS: 10.0000 mg | ORAL_TABLET | Freq: Every day | ORAL | 1 refills | Status: DC

## 2016-11-11 MED ORDER — NAPROXEN 500 MG PO TABS: 500.0000 mg | ORAL_TABLET | Freq: Two times a day (BID) | ORAL | 0 refills | Status: DC

## 2016-11-11 MED ORDER — ENALAPRIL MALEATE 20 MG PO TABS: 40.0000 mg | ORAL_TABLET | Freq: Every day | ORAL | 2 refills | Status: DC

## 2016-11-11 MED ORDER — FUROSEMIDE 40 MG PO TABS: 40.0000 mg | ORAL_TABLET | Freq: Every day | ORAL | 2 refills | Status: DC

## 2016-11-11 NOTE — Patient Instructions (Signed)
Echocardiogram An echocardiogram, or echocardiography, uses sound waves (ultrasound) to produce an image of your heart. The echocardiogram is simple, painless, obtained within a short period of time, and offers valuable information to your health care provider. The images from an echocardiogram can provide information such as:  Evidence of coronary artery disease (CAD).  Heart size.  Heart muscle function.  Heart valve function.  Aneurysm detection.  Evidence of a past heart attack.  Fluid buildup around the heart.  Heart muscle thickening.  Assess heart valve function.  Tell a health care provider about:  Any allergies you have.  All medicines you are taking, including vitamins, herbs, eye drops, creams, and over-the-counter medicines.  Any problems you or family members have had with anesthetic medicines.  Any blood disorders you have.  Any surgeries you have had.  Any medical conditions you have.  Whether you are pregnant or may be pregnant. What happens before the procedure? No special preparation is needed. Eat and drink normally. What happens during the procedure?  In order to produce an image of your heart, gel will be applied to your chest and a wand-like tool (transducer) will be moved over your chest. The gel will help transmit the sound waves from the transducer. The sound waves will harmlessly bounce off your heart to allow the heart images to be captured in real-time motion. These images will then be recorded.  You may need an IV to receive a medicine that improves the quality of the pictures. What happens after the procedure? You may return to your normal schedule including diet, activities, and medicines, unless your health care provider tells you otherwise. This information is not intended to replace advice given to you by your health care provider. Make sure you discuss any questions you have with your health care provider. Document Released: 10/16/2000  Document Revised: 06/06/2016 Document Reviewed: 06/26/2013 Elsevier Interactive Patient Education  2017 Elsevier Inc.  

## 2016-11-11 NOTE — BH Specialist Note (Signed)
Session Start time: 10:30 AM   End Time: 11:00 AM Total Time:  30 minutes Type of Service: [place]: No.   Interpreter Name & Language: N/A # [facility] Visits July 2017-June 2018: 1st   SUBJECTIVE: [name] is a 56 y.o. female  Pt. was referred by FNP [place] for:  anxiety and depression. Pt. reports the following symptoms/concerns: chronic back pain, difficulty sleeping, low energy, and overwhelming feelings of worry Duration of problem:  Pt reports onset of symptoms occurred approx two years ago shortly after having back surgery Severity: moderately severe  Previous treatment: None reported   OBJECTIVE: Mood: Anxious and Irritable & Affect: Appropriate Risk of harm to self or others: Pt reported that she is extremely frustrated about her health and has had SI because her quality of life is not the same as before back surgery. Pt has no plan or intentions of harming self or others Assessments administered: PHQ-9; GAD-7  LIFE CONTEXT:  Family & Social: Pt has three adult children. She resides with her adult daughter School/ Work: Pt recently graduated from [employer]. She has no insurance and works part time at a [employer] Self-Care: Pt has limited mobility due to medical health. She walks to exercise and has a low sodium diet. No report of substance use Life changes: Pt's quality of life has decreased due to chronic back pain from previous surgery What is important to pt/family (values): Family, Independence, Good Health   GOALS ADDRESSED:  Decrease symptoms of depression Decrease symptoms of anxiety  INTERVENTIONS: Solution Focused, Strength-based and Supportive   ASSESSMENT:  Pt currently experiencing depression and anxiety triggered by frustration with her physical health after undergoing back surgery approximately two years ago. Pt reports chronic back pain, difficulty sleeping, low energy, and overwhelming  feelings of worry. Pt may benefit from psychoeducation, psychotherapy, and medication management.  [place] educated pt on the correlation between physical and mental health and provided validation of feelings. LCSWA inquired protective factors with pt and discussed benefits of applying healthy coping skills to address chronic pain, anxiety, and depression. Pt identified strategies to utilize on a daily basis. LCSWA discussed benefits of scheduling an appointment with Financial Counselor to assist with financial strain. Pt was provided community resources for crisis intervention, therapy, and medication management.     PLAN: 1. F/U with behavioral health clinician: Pt was encouraged to contact [place] if symptoms worsen or fail to improve to schedule behavioral appointments at [facility] 2. Behavioral Health meds: None reported, pt is not interested in medication management 3. Behavioral recommendations: LCSWA recommends that pt apply healthy coping skills discussed and schedule appointment with Financial Counseling. Pt is encouraged to schedule follow up appointment with LCSWA 4. Referral: Brief Counseling/Psychotherapy, [employer], Problem-solving teaching/coping strategies, Psychoeducation and Supportive Counseling 5. From scale of 1-10, how likely are you to follow plan: 7/10   Rebekah Chesterfield, MSW, [place] Worker  11/12/16 9:06 AM  Warmhandoff:   Warm Hand Off Completed.

## 2016-11-11 NOTE — Progress Notes (Addendum)
Subjective:  Patient ID: [name], female    DOB: 30-Oct-1961  Age: 56 y.o. MRN: [id number]  CC: Hypertension   HPI AMEIAH SHEAR presents for HTN. She also has c/o chronic back pain with right sided sciatica of the nerve and headches. Denies any CP, SOB, or swelling of the extremities. She does report palpitations 2 weeks ago and PMH of mitral valve prolapse with heart failure. She reports history of back surgery 4 years ago. Reports 3 months after surgery developing abdominal swelling and later having a endoscopy performed. She reports  having generalized pain for the two years that is worsened with leg spasms. Headaches are relieved with ibuprofen. Patient is crying and reports anxiety concerning her health. She states during the visit " I have a hard time trusting doctors because I feel that most don't know what their talking about". She reports difficulty sleeping, denies poor eating habits. She states "In the past, I have said I don't feel like being here if I have to go through all of this". Denies SI/HI. She reports history of anxiety and receiving counseling when she was in graduate school 8 months ago, but stopped sessions when she graduated due to lack of insurance coverage.  Outpatient Medications Prior to Visit  Medication Sig Dispense Refill  . amitriptyline (ELAVIL) 25 MG tablet Take 1 tablet (25 mg total) by mouth at bedtime. 30 tablet 1  . aspirin 81 MG chewable tablet Chew 81 mg by mouth daily.    . Cholecalciferol (D3 ADULT PO) Take 1 tablet by mouth daily.    . Cyanocobalamin (B-12 PO) Take 2 tablets by mouth daily. Reported on 05/18/2016    . enalapril (VASOTEC) 20 MG tablet Take 2 tablets (40 mg total) by mouth daily. 180 tablet 3  . furosemide (LASIX) 40 MG tablet Take 1 tablet by mouth daily.  2  . hydrochlorothiazide (HYDRODIURIL) 25 MG tablet Take 1 tablet (25 mg total) by mouth daily. 90 tablet 3  . LORazepam (ATIVAN) 1 MG tablet Take 1 tablet (1 mg total) by mouth 2  (two) times daily. 60 tablet 0  . Lorcaserin HCl 10 MG TABS Take 10 mg by mouth 2 (two) times daily. 60 tablet 0   No facility-administered medications prior to visit.     ROS Review of Systems  Eyes: Positive for visual disturbance.  Respiratory: Negative.   Cardiovascular: Positive for palpitations.  Gastrointestinal: Negative.   Genitourinary: Negative.   Musculoskeletal: Positive for myalgias.  Neurological: Positive for headaches.   Objective:  There were no vitals taken for this visit.  BP/Weight 07/29/2016 [id number] 0000000  Systolic BP 0000000 - 0000000  Diastolic BP 88 - 123XX123  Wt. (Lbs) - 215 218  BMI - 33.67 34.14   Physical Exam  Eyes: Pupils are equal, round, and reactive to light.  Neck: No JVD present.  Cardiovascular: Normal rate, regular rhythm, normal heart sounds and intact distal pulses.   Pulmonary/Chest: Effort normal and breath sounds normal.  Abdominal: Soft. Bowel sounds are normal.  Musculoskeletal:       Lumbar back: She exhibits pain.  Psychiatric: Her mood appears anxious. Her speech is rapid and/or pressured. She is hyperactive. Cognition and memory are normal. She expresses impulsivity. She expresses no homicidal and no suicidal ideation.   Assessment & Plan:   Problem List Items Addressed This Visit      Cardiovascular and Mediastinum   Essential hypertension   Relevant Medications   enalapril (VASOTEC) 20 MG tablet  furosemide (LASIX) 40 MG tablet   Diastolic heart failure (HCC)   Relevant Medications   enalapril (VASOTEC) 20 MG tablet   furosemide (LASIX) 40 MG tablet     Other   Generalized anxiety disorder (Chronic)   -LCSW spoke with patient and provided counseling resources.   Relevant Medications   amitriptyline (ELAVIL) 10 MG tablet    Other Visit Diagnoses    History of chronic back pain    -  Primary   Relevant Medications   naproxen (NAPROSYN) 500 MG tablet   cyclobenzaprine (FLEXERIL) 10 MG tablet   Muscle spasms of both  lower extremities       Relevant Medications   cyclobenzaprine (FLEXERIL) 10 MG tablet   Other Relevant Orders   Basic Metabolic Panel (Completed)   CBC with Differential (Completed)   Vitamin D, 25-hydroxy (Completed)   History of palpitations       Relevant Orders   EKG 12-Lead   ECHOCARDIOGRAM COMPLETE (Completed)   Weight gain       Relevant Orders   TSH (Completed)   Fibromyalgia       Relevant Medications   amitriptyline (ELAVIL) 10 MG tablet   Decreased visual acuity       Relevant Orders   Ambulatory referral to Ophthalmology     Meds ordered this encounter  Medications  . naproxen (NAPROSYN) 500 MG tablet    Sig: Take 1 tablet (500 mg total) by mouth 2 (two) times daily with a meal.    Dispense:  60 tablet    Refill:  0    Order Specific Question:   Supervising Provider    Answer:   Tresa Garter [id number]  . cyclobenzaprine (FLEXERIL) 10 MG tablet    Sig: Take 1 tablet (10 mg total) by mouth 3 (three) times daily as needed for muscle spasms.    Dispense:  40 tablet    Refill:  0    Order Specific Question:   Supervising Provider    Answer:   Tresa Garter [id number]  . enalapril (VASOTEC) 20 MG tablet    Sig: Take 2 tablets (40 mg total) by mouth daily.    Dispense:  30 tablet    Refill:  2    Order Specific Question:   Supervising Provider    Answer:   Tresa Garter [id number]  . amitriptyline (ELAVIL) 10 MG tablet    Sig: Take 1 tablet (10 mg total) by mouth at bedtime.    Dispense:  30 tablet    Refill:  1    Order Specific Question:   Supervising Provider    Answer:   Tresa Garter [id number]  . furosemide (LASIX) 40 MG tablet    Sig: Take 1 tablet (40 mg total) by mouth daily.    Dispense:  30 tablet    Refill:  2    Order Specific Question:   Supervising Provider    Answer:   Tresa Garter [id number]    Follow-up: Return in about 3 months (around 02/09/2017) for Hypertension.   Alfonse Spruce FNP

## 2016-11-11 NOTE — Progress Notes (Signed)
Patient is here for vision issues I was trying to do the vision test but once the patient was doing it she started to cry cause she could not see very well.  Patient also stated that she has pain all over her body like muscle spans and her pain level is an 8  Patient declined flu shot today

## 2016-11-12 ENCOUNTER — Ambulatory Visit (HOSPITAL_COMMUNITY)
Admission: RE | Admit: 2016-11-12 | Discharge: 2016-11-12 | Disposition: A | Discharge status: Home / Self Care | Payer: Self-pay | Source: Ambulatory Visit | Attending: Family Medicine | Admitting: Family Medicine

## 2016-11-12 DIAGNOSIS — R0600 Dyspnea, unspecified: Secondary | ICD-10-CM | POA: Insufficient documentation

## 2016-11-12 DIAGNOSIS — G459 Transient cerebral ischemic attack, unspecified: Secondary | ICD-10-CM | POA: Insufficient documentation

## 2016-11-12 DIAGNOSIS — R002 Palpitations: Secondary | ICD-10-CM | POA: Insufficient documentation

## 2016-11-12 DIAGNOSIS — Z87898 Personal history of other specified conditions: Secondary | ICD-10-CM

## 2016-11-12 DIAGNOSIS — I10 Essential (primary) hypertension: Secondary | ICD-10-CM | POA: Insufficient documentation

## 2016-11-12 DIAGNOSIS — I341 Nonrheumatic mitral (valve) prolapse: Secondary | ICD-10-CM | POA: Insufficient documentation

## 2016-11-12 DIAGNOSIS — E785 Hyperlipidemia, unspecified: Secondary | ICD-10-CM | POA: Insufficient documentation

## 2016-11-12 LAB — VITAMIN D 25 HYDROXY (VIT D DEFICIENCY, FRACTURES): VIT D 25 HYDROXY: 43 ng/mL (ref 30–100)

## 2016-11-12 NOTE — Progress Notes (Signed)
  Echocardiogram 2D Echocardiogram has been performed.  [name] 11/12/2016, 1:58 PM

## 2016-11-13 ENCOUNTER — Telehealth: Payer: Self-pay

## 2016-11-13 NOTE — Telephone Encounter (Signed)
-----   Message from Alfonse Spruce, [place] sent at 11/13/2016  1:49 PM EST ----- -Lab results were normal. -Echocardiogram which determines how well your heart pumps is normal. Continue to take your blood pressure medications as prescribed. -Follow up as needed. -List of counseling resources given.

## 2016-11-13 NOTE — Telephone Encounter (Signed)
Patient Verify DOB  Patient was aware and understood that her labs results were normal and also her echocardiogram was normal too  Also to continue her BP meds as prescribed & F/up as needed  Patient did not have any further questions

## 2016-11-16 ENCOUNTER — Other Ambulatory Visit: Payer: Self-pay | Admitting: Family Medicine

## 2016-11-26 ENCOUNTER — Emergency Department (HOSPITAL_COMMUNITY)
Admission: EM | Admit: 2016-11-26 | Discharge: 2016-11-26 | Disposition: A | Discharge status: Home / Self Care | Payer: Self-pay | Attending: Emergency Medicine | Admitting: Emergency Medicine

## 2016-11-26 ENCOUNTER — Emergency Department (HOSPITAL_COMMUNITY): Payer: Self-pay

## 2016-11-26 ENCOUNTER — Encounter (HOSPITAL_COMMUNITY): Payer: Self-pay | Admitting: Emergency Medicine

## 2016-11-26 DIAGNOSIS — I5030 Unspecified diastolic (congestive) heart failure: Secondary | ICD-10-CM | POA: Insufficient documentation

## 2016-11-26 DIAGNOSIS — H6591 Unspecified nonsuppurative otitis media, right ear: Secondary | ICD-10-CM

## 2016-11-26 DIAGNOSIS — Z8673 Personal history of transient ischemic attack (TIA), and cerebral infarction without residual deficits: Secondary | ICD-10-CM | POA: Insufficient documentation

## 2016-11-26 DIAGNOSIS — R55 Syncope and collapse: Secondary | ICD-10-CM | POA: Insufficient documentation

## 2016-11-26 DIAGNOSIS — Z7982 Long term (current) use of aspirin: Secondary | ICD-10-CM | POA: Insufficient documentation

## 2016-11-26 DIAGNOSIS — H9201 Otalgia, right ear: Secondary | ICD-10-CM | POA: Insufficient documentation

## 2016-11-26 DIAGNOSIS — I110 Hypertensive heart disease with heart failure: Secondary | ICD-10-CM | POA: Insufficient documentation

## 2016-11-26 DIAGNOSIS — Z79899 Other long term (current) drug therapy: Secondary | ICD-10-CM | POA: Insufficient documentation

## 2016-11-26 DIAGNOSIS — J45909 Unspecified asthma, uncomplicated: Secondary | ICD-10-CM | POA: Insufficient documentation

## 2016-11-26 DIAGNOSIS — R4020 Unspecified coma: Secondary | ICD-10-CM

## 2016-11-26 LAB — BASIC METABOLIC PANEL
Anion gap: 8 (ref 5–15)
BUN: 10 mg/dL (ref 6–20)
CALCIUM: 9.3 mg/dL (ref 8.9–10.3)
CO2: 25 mmol/L (ref 22–32)
Chloride: 108 mmol/L (ref 101–111)
Creatinine, Ser: 0.76 mg/dL (ref 0.44–1.00)
GFR calc Af Amer: >60 mL/min (ref >60)
Glucose, Bld: 99 mg/dL (ref 65–99)
Potassium: 4.2 mmol/L (ref 3.5–5.1)
Sodium: 141 mmol/L (ref 135–145)

## 2016-11-26 LAB — URINALYSIS, ROUTINE W REFLEX MICROSCOPIC
BILIRUBIN URINE: NEGATIVE
Glucose, UA: NEGATIVE mg/dL
HGB URINE DIPSTICK: NEGATIVE
Ketones, ur: NEGATIVE mg/dL
Leukocytes, UA: NEGATIVE
NITRITE: NEGATIVE
Protein, ur: NEGATIVE mg/dL
SPECIFIC GRAVITY, URINE: 1.006 (ref 1.005–1.030)
pH: 7 (ref 5.0–8.0)

## 2016-11-26 LAB — CBC
HCT: 38.9 % (ref 36.0–46.0)
HEMOGLOBIN: 12.8 g/dL (ref 12.0–15.0)
MCH: 29.7 pg (ref 26.0–34.0)
MCHC: 32.9 g/dL (ref 30.0–36.0)
MCV: 90.3 fL (ref 78.0–100.0)
PLATELETS: 192 10*3/uL (ref 150–400)
RBC: 4.31 MIL/uL (ref 3.87–5.11)
RDW: 12.5 % (ref 11.5–15.5)
WBC: 7.3 10*3/uL (ref 4.0–10.5)

## 2016-11-26 LAB — RAPID URINE DRUG SCREEN, HOSP PERFORMED
AMPHETAMINES: NOT DETECTED
Barbiturates: NOT DETECTED
Benzodiazepines: NOT DETECTED
Cocaine: NOT DETECTED
Opiates: NOT DETECTED
Tetrahydrocannabinol: NOT DETECTED

## 2016-11-26 LAB — CBG MONITORING, ED: GLUCOSE-CAPILLARY: 81 mg/dL (ref 65–99)

## 2016-11-26 MED ORDER — PSEUDOEPHEDRINE HCL 60 MG PO TABS: 60.0000 mg | ORAL_TABLET | Freq: Four times a day (QID) | ORAL | 0 refills | Status: DC | PRN

## 2016-11-26 NOTE — ED Notes (Signed)
Patient transported to CT 

## 2016-11-26 NOTE — Discharge Instructions (Signed)
Take your medication as prescribed. Continue taking your medications as prescribed. I recommend fine up with your primary care provider within the next week for follow-up evaluation regarding your right middle ear effusion. You will receive a call from the neurology clinic to schedule a follow-up appointment within the next week. Please return to the Emergency Department if symptoms worsen or new onset of fever, headache, visual changes, chest pain, difficult breathing, dizziness, abdominal pain, vomiting, numbness, tingling, weakness, syncope, seizure.

## 2016-11-26 NOTE — ED Notes (Signed)
Pt verbalized understanding discharge instructions and denies any further needs or questions at this time. VS stable, ambulatory and steady gait.   

## 2016-11-26 NOTE — ED Provider Notes (Signed)
[facility] DEPT Provider Note   CSN: [id number] Arrival date & time: 11/26/16  1451     History   Chief Complaint Chief Complaint  Patient presents with  . Loss of Consciousness  . Otalgia    HPI [name] is a 56 y.o. female.  HPI   Patient is a 56 year old female with history of hypertension, asthma, lupus, anxiety ENT a who presents the ED status post syncopal episode that occurred prior to arrival. Patient reports she began having right ear pain this morning which she describes as "pressure and fullness". He states the sensation gradually worsened throughout the afternoon and notes she walked into her bosses office to tell her she was leaving to have her ear evaluated. Patient's boss at bedside reports after the patient walked into her room she suddenly fell on the ground and passed out. Patient also reports she noticed the patient having shaking/jerking to her right arm which lasted less than a minute while the patient remained unconscious. She reports shortly after when the patient regained consciousness she appeared confused and disoriented. Patient denies history of seizures. Patient denies any precipitating symptoms. Denies fever, chills, headache, lightheadedness, facial swelling, ear drainage, loss of hearing, chest pain, shortness of breath, palpitations, abdominal pain, nausea, vomiting, diarrhea, urinary symptoms, loss of control of bowel or bladder, numbness, tingling, weakness. Patient reports she has been taking her home medications as prescribed and denies any recent changes. Denies use of anticoagulants.  Past Medical History:  Diagnosis Date  . Anxiety   . Asthma   . Atypical chest pain    in the setting of anxiety  . Back pain   . Chronic pain   . Diastolic heart failure ([place])   . Edema   . Fibromyalgia   . Hypertension   . IBS (irritable bowel syndrome)   . Lupus   . MVP (mitral valve prolapse) 12/22/2011   not confirmed on Echo 08/2015  . Pain in  limb   . Positive ANA (antinuclear antibody) 01/29/2015  . S/P dilatation of esophageal stricture 12/22/2011  . Syncopal episodes   . TIA (transient ischemic attack)     Patient Active Problem List   Diagnosis Date Noted  . Diastolic heart failure ([place]) 11/11/2016  . Cervical myofascial pain syndrome 01/08/2016  . CTS (carpal tunnel syndrome) 12/05/2015  . Radiculopathy of lumbosacral region 12/05/2015  . Morning headache 08/29/2015  . Daytime somnolence 08/29/2015  . Obesity 08/29/2015  . Snoring 08/29/2015  . Paresthesias 08/29/2015  . Convulsions/seizures ([place]) 08/29/2015  . Altered awareness, transient   . Cervical paraspinal muscle spasm 08/03/2015  . Other specified transient cerebral ischemias   . Essential hypertension   . Noncompliance with medication regimen   . TIA (transient ischemic attack) 08/02/2015  . Leg swelling 01/29/2015  . Positive ANA (antinuclear antibody) 01/29/2015  . Dizzy spells 01/01/2015  . Shortness of breath 11/20/2014  . DDD (degenerative disc disease), cervical 11/09/2013  . Generalized anxiety disorder 12/27/2012  . 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 Allergic rhinitis 11/27/2012  . Hyperlipidemia 12/22/2011  . MVP (mitral valve prolapse) 12/22/2011  . S/P dilatation of esophageal stricture 12/22/2011  . Atypical chest pain 08/19/2011    Past Surgical History:  Procedure Laterality Date  . ABDOMINAL HYSTERECTOMY    . CARDIAC CATHETERIZATION    . CARDIAC CATHETERIZATION  07/2014   normal coronary arteries [facility])  . SPINE SURGERY    . TONSILLECTOMY      OB History    No data  available       Home Medications    Prior to Admission medications   Medication Sig Start Date End Date Taking? Authorizing Provider  Ascorbic Acid (VITAMIN C) 100 MG CHEW Chew 1 tablet by mouth daily.   Yes Historical Provider, MD  aspirin 81 MG chewable tablet Chew 81 mg by mouth daily.   Yes Historical Provider, MD  Cholecalciferol (D3 ADULT PO) Take 1  tablet by mouth daily.   Yes Historical Provider, MD  Cyanocobalamin (B-12 PO) Take 2 tablets by mouth daily. Reported on 05/18/2016   Yes Historical Provider, MD  enalapril (VASOTEC) 20 MG tablet Take 2 tablets (40 mg total) by mouth daily. 11/11/16  Yes Alfonse Spruce, FNP  furosemide (LASIX) 40 MG tablet Take 1 tablet (40 mg total) by mouth daily. 11/11/16  Yes Alfonse Spruce, FNP  Multiple Vitamin (MULTIVITAMIN WITH MINERALS) TABS tablet Take 1 tablet by mouth daily.   Yes Historical Provider, MD  amitriptyline (ELAVIL) 10 MG tablet Take 1 tablet (10 mg total) by mouth at bedtime. Patient not taking: Reported on 11/26/2016 11/11/16   Alfonse Spruce, FNP  cyclobenzaprine (FLEXERIL) 10 MG tablet Take 1 tablet (10 mg total) by mouth 3 (three) times daily as needed for muscle spasms. Patient not taking: Reported on 11/26/2016 11/11/16   Alfonse Spruce, FNP  enalapril (VASOTEC) 20 MG tablet Take 2 tablets (40 mg total) by mouth daily. Patient not taking: Reported on 11/26/2016 07/19/16   Dorothyann Peng, NP  LORazepam (ATIVAN) 1 MG tablet Take 1 tablet (1 mg total) by mouth 2 (two) times daily. Patient not taking: Reported on 11/26/2016 12/02/15   Dorothyann Peng, NP  naproxen (NAPROSYN) 500 MG tablet Take 1 tablet (500 mg total) by mouth 2 (two) times daily with a meal. Patient not taking: Reported on 11/26/2016 11/11/16   Alfonse Spruce, FNP  pseudoephedrine (SUDAFED) 60 MG tablet Take 1 tablet (60 mg total) by mouth every 6 (six) hours as needed for congestion. 11/26/16   Nona Dell, PA-C    Family History Family History  Problem Relation Age of Onset  . Diabetes Mother   . Hypertension Mother   . Heart disease Mother   . Arthritis/Rheumatoid Mother   . Diabetes Father   . Hypertension Father   . COPD Father   . Heart attack Father 67  . Emphysema Father   . Stroke Other   . Coronary artery disease Other     Social History Social History  Substance Use Topics   . Smoking status: Never Smoker  . Smokeless tobacco: Never Used  . Alcohol use No     Allergies   Hydroxychloroquine; Gabapentin; Hydrochlorothiazide; Metoprolol; Morphine and related; Other; Pantoprazole sodium; and Prednisone   Review of Systems Review of Systems  HENT: Positive for ear pain (right).   Neurological: Positive for syncope.  All other systems reviewed and are negative.    Physical Exam Updated Vital Signs BP 142/93   Pulse 65   Temp 97.9 F (36.6 C) (Oral)   Resp 22   Ht 5\' 6"  (1.676 m)   Wt 97.5 kg   SpO2 99%   BMI 34.70 kg/m   Physical Exam  Constitutional: She is oriented to person, place, and time. She appears well-developed and well-nourished.  HENT:  Head: Normocephalic and atraumatic. Head is without raccoon's eyes, without Battle's sign, without abrasion and without laceration.  Right Ear: Hearing, external ear and ear canal normal. No drainage, swelling or  tenderness. No mastoid tenderness. A middle ear effusion is present. No hemotympanum.  Left Ear: Hearing, tympanic membrane, external ear and ear canal normal. No mastoid tenderness. No hemotympanum.  Nose: Nose normal. No sinus tenderness, nasal deformity, septal deviation or nasal septal hematoma. No epistaxis. Right sinus exhibits no maxillary sinus tenderness and no frontal sinus tenderness. Left sinus exhibits no maxillary sinus tenderness and no frontal sinus tenderness.  Mouth/Throat: Uvula is midline, oropharynx is clear and moist and mucous membranes are normal. No oropharyngeal exudate, posterior oropharyngeal edema, posterior oropharyngeal erythema or tonsillar abscesses. No tonsillar exudate.  Eyes: Conjunctivae and EOM are normal. Pupils are equal, round, and reactive to light. Right eye exhibits no discharge. Left eye exhibits no discharge. No scleral icterus.  Neck: Normal range of motion. Neck supple.  Cardiovascular: Normal rate, regular rhythm, normal heart sounds and intact  distal pulses.   Pulmonary/Chest: Effort normal and breath sounds normal. No respiratory distress. She has no wheezes. She has no rales. She exhibits no tenderness.  Abdominal: Soft. Bowel sounds are normal. She exhibits no distension and no mass. There is no tenderness. There is no rebound and no guarding. No hernia.  Musculoskeletal: Normal range of motion. She exhibits no edema or tenderness.  No cervical, thoracic, or lumbar spine midline TTP. Full ROM of bilateral upper and lower extremities with 5/5 strength.   2+ radial and PT pulses. Sensation grossly intact.   Neurological: She is alert and oriented to person, place, and time. She has normal strength. No cranial nerve deficit or sensory deficit. Coordination and gait normal. GCS eye subscore is 4. GCS verbal subscore is 5. GCS motor subscore is 6.  Skin: Skin is warm and dry. She is not diaphoretic.  Nursing note and vitals reviewed.    ED Treatments / Results  Labs (all labs ordered are listed, but only abnormal results are displayed) Labs Reviewed  URINALYSIS, ROUTINE W REFLEX MICROSCOPIC - Abnormal; Notable for the following:       Result Value   Color, Urine COLORLESS (*)    All other components within normal limits  BASIC METABOLIC PANEL  CBC  RAPID URINE DRUG SCREEN, HOSP PERFORMED  CBG MONITORING, ED    EKG  EKG Interpretation  Date/Time:  Thursday November 26 2016 15:10:11 EST Ventricular Rate:  72 PR Interval:    QRS Duration: 106 QT Interval:  415 QTC Calculation: 455 R Axis:   11 Text Interpretation:  Sinus rhythm Probable left atrial enlargement RSR' in V1 or V2, probably normal variant No significant change since last tracing Borderline ECG Confirmed by Carmin Muskrat  MD [phone]) on 11/26/2016 5:05:52 PM       Radiology Ct Head Wo Contrast  Result Date: 11/26/2016 CLINICAL DATA:  Dizziness, syncope. EXAM: CT HEAD WITHOUT CONTRAST TECHNIQUE: Contiguous axial images were obtained from the base of the  skull through the vertex without intravenous contrast. COMPARISON:  CT scan of July 28, 2016. FINDINGS: Brain: No evidence of acute infarction, hemorrhage, hydrocephalus, extra-axial collection or mass lesion/mass effect. Vascular: No hyperdense vessel or unexpected calcification. Skull: Normal. Negative for fracture or focal lesion. Sinuses/Orbits: No acute finding. Other: None. IMPRESSION: Normal head CT. Electronically Signed   By: Marijo Conception, M.D.   On: 11/26/2016 16:43    Procedures Procedures (including critical care time)  Medications Ordered in ED Medications - No data to display   Initial Impression / Assessment and Plan / ED Course  I have reviewed the triage vital signs and  the nursing notes.  Pertinent labs & imaging results that were available during my care of the patient were reviewed by me and considered in my medical decision making (see chart for details).     Patient presents with witnessed syncopal episode that occurred prior to arrival. Patient also reports after patient walked into her room she passed out and fell on the ground reported jerking movement to her right arm which lasted a few seconds before patient regained consciousness. Patient with reported postictal behavior which have resolved since arrival to the ED. Patient also reports having pressure-like sensation to right ear that started this morning. Denies fever. Denies hx of seizures. VSS. Exam revealed right middle ear effusion. Remaining exam unremarkable. No evidence of head injury. Patient with no evidence of focal neuro deficits on physical exam and is at mental baseline. EKG showed sinus rhythm with no acute ischemic changes. Labs and urine unremarkable. CT head negative. Discussed pt with Dr. Vanita Panda. Suspect pt's episode of LOC was likely due to seizure. Orders placed for outpatient neurology follow up. On reevaluation patient is sitting resting in bed comfortably. Patient has remained  hemodynamically stable while in the ED. Patient able to stand and ambulate without assistance. Plan to also d/c pt home with symptomatic tx and decongestant for right mid ear effusion. Discussed results and plan for discharge with patient. Discussed return precautions.   Final Clinical Impressions(s) / ED Diagnoses   Final diagnoses:  LOC (loss of consciousness) (HCC)  Middle ear effusion, right    New Prescriptions New Prescriptions   PSEUDOEPHEDRINE (SUDAFED) 60 MG TABLET    Take 1 tablet (60 mg total) by mouth every 6 (six) hours as needed for congestion.     Chesley Noon [place], [place] 11/26/16 [place], MD 12/01/16 [phone]

## 2016-11-26 NOTE — ED Triage Notes (Signed)
Pt arrives from work via [employer] reporting R ear paoin starting yesterday, worsening today.  EMS reports witnessed syncope around 1430 lasting approx 3 minutes.  Pt's coworker reports pt's RUE "jerking" while pt unconscious.  EMS reports pt AMS for most of ride.  Pt AOx4 at this time. No focal deficits noted.

## 2016-12-03 ENCOUNTER — Telehealth: Payer: Self-pay | Admitting: Family Medicine

## 2016-12-03 NOTE — Telephone Encounter (Signed)
Pt. Called requesting to be referred to [facility] because her BP is always high. Pt. States she passed out last Friday and her BP  Was 220/116. Please advice.

## 2016-12-03 NOTE — Telephone Encounter (Signed)
Patient has history of a recent ED visit on 11/26/16. BP was 142/93. Patient will need to schedule an appointment to be evaluated before any referral can be made.   If I have an opening available she can be added to my schedule tomorrow or sometime early next week to be evaluated.

## 2016-12-03 NOTE — Telephone Encounter (Signed)
Pt. Called requesting to be referred to [facility] because her BP is always high. Pt. States she passed out last Friday and her BP  Was 220/116. Please f/u.

## 2016-12-04 ENCOUNTER — Ambulatory Visit: Payer: Self-pay | Attending: Family Medicine | Admitting: Family Medicine

## 2016-12-04 ENCOUNTER — Encounter: Payer: Self-pay | Admitting: Family Medicine

## 2016-12-04 VITALS — BP 133/88 | HR 87 | Resp 18 | Ht 67.0 in | Wt 217.4 lb

## 2016-12-04 DIAGNOSIS — Z7982 Long term (current) use of aspirin: Secondary | ICD-10-CM | POA: Insufficient documentation

## 2016-12-04 DIAGNOSIS — I10 Essential (primary) hypertension: Secondary | ICD-10-CM | POA: Insufficient documentation

## 2016-12-04 DIAGNOSIS — R55 Syncope and collapse: Secondary | ICD-10-CM | POA: Insufficient documentation

## 2016-12-04 NOTE — Patient Instructions (Signed)
Hypertension Hypertension, commonly called high blood pressure, is when the force of blood pumping through your arteries is too strong. Your arteries are the blood vessels that carry blood from your heart throughout your body. A blood pressure reading consists of a higher number over a lower number, such as 110/72. The higher number (systolic) is the pressure inside your arteries when your heart pumps. The lower number (diastolic) is the pressure inside your arteries when your heart relaxes. Ideally you want your blood pressure below 120/80. Hypertension forces your heart to work harder to pump blood. Your arteries may become narrow or stiff. Having untreated or uncontrolled hypertension can cause heart attack, stroke, kidney disease, and other problems. What increases the risk? Some risk factors for high blood pressure are controllable. Others are not. Risk factors you cannot control include:  Race. You may be at higher risk if you are African American.  Age. Risk increases with age.  Gender. Men are at higher risk than women before age 45 years. After age 65, women are at higher risk than men. Risk factors you can control include:  Not getting enough exercise or physical activity.  Being overweight.  Getting too much fat, sugar, calories, or salt in your diet.  Drinking too much alcohol. What are the signs or symptoms? Hypertension does not usually cause signs or symptoms. Extremely high blood pressure (hypertensive crisis) may cause headache, anxiety, shortness of breath, and nosebleed. How is this diagnosed? To check if you have hypertension, your health care provider will measure your blood pressure while you are seated, with your arm held at the level of your heart. It should be measured at least twice using the same arm. Certain conditions can cause a difference in blood pressure between your right and left arms. A blood pressure reading that is higher than normal on one occasion does  not mean that you need treatment. If it is not clear whether you have high blood pressure, you may be asked to return on a different day to have your blood pressure checked again. Or, you may be asked to monitor your blood pressure at home for 1 or more weeks. How is this treated? Treating high blood pressure includes making lifestyle changes and possibly taking medicine. Living a healthy lifestyle can help lower high blood pressure. You may need to change some of your habits. Lifestyle changes may include:  Following the DASH diet. This diet is high in fruits, vegetables, and whole grains. It is low in salt, red meat, and added sugars.  Keep your sodium intake below 2,300 mg per day.  Getting at least 30-45 minutes of aerobic exercise at least 4 times per week.  Losing weight if necessary.  Not smoking.  Limiting alcoholic beverages.  Learning ways to reduce stress. Your health care provider may prescribe medicine if lifestyle changes are not enough to get your blood pressure under control, and if one of the following is true:  You are 18-59 years of age and your systolic blood pressure is above 140.  You are 60 years of age or older, and your systolic blood pressure is above 150.  Your diastolic blood pressure is above 90.  You have diabetes, and your systolic blood pressure is over 140 or your diastolic blood pressure is over 90.  You have kidney disease and your blood pressure is above 140/90.  You have heart disease and your blood pressure is above 140/90. Your personal target blood pressure may vary depending on your medical   conditions, your age, and other factors. Follow these instructions at home:  Have your blood pressure rechecked as directed by your health care provider.  Take medicines only as directed by your health care provider. Follow the directions carefully. Blood pressure medicines must be taken as prescribed. The medicine does not work as well when you skip  doses. Skipping doses also puts you at risk for problems.  Do not smoke.  Monitor your blood pressure at home as directed by your health care provider. Contact a health care provider if:  You think you are having a reaction to medicines taken.  You have recurrent headaches or feel dizzy.  You have swelling in your ankles.  You have trouble with your vision. Get help right away if:  You develop a severe headache or confusion.  You have unusual weakness, numbness, or feel faint.  You have severe chest or abdominal pain.  You vomit repeatedly.  You have trouble breathing. This information is not intended to replace advice given to you by your health care provider. Make sure you discuss any questions you have with your health care provider. Document Released: 10/19/2005 Document Revised: 03/26/2016 Document Reviewed: 08/11/2013 Elsevier Interactive Patient Education  2017 Elsevier Inc.  

## 2016-12-04 NOTE — Progress Notes (Signed)
Patient complains pain on right shoulder to her arm she feels like cramping  Patient stated that she pass ou last Friday  Patient wants a referral to the nephrology  Patient has eaten today and has taken her meds today

## 2016-12-04 NOTE — Progress Notes (Signed)
Subjective:  Patient ID: [name], female    DOB: 1961-07-17  Age: 56 y.o. MRN: [id number]  CC: Establish Care   HPI SHADASIA SABINE presents for referral. She reports last week having an episode of syncope and being transported by EMS to the ED. She reports her systolic blood pressure at the time was 220. Report from recent ED visit shows blood pressure of 142/93. Blood pressure during office visit today is 133/83 HR 87. Patient is specifically requesting to be referred to [facility] nephrology for a hypertension specialist. She reports calling herself and being told she must be referred. Patient states "I'm not just going sit here and watch myself die" Patient was asked about medication adherence at home. She reports taking her blood pressure medications as prescribed. She denies taking blood pressure measurements daily. She denies having a blood pressure log. When it was recommended that she began taking blood pressure measurements daily and begin keeping a log of her blood pressures to see if any adjustments could be made prior to referral,  patient became increasingly agitated and verbally aggressive.    Outpatient Medications Prior to Visit  Medication Sig Dispense Refill  . amitriptyline (ELAVIL) 10 MG tablet Take 1 tablet (10 mg total) by mouth at bedtime. (Patient not taking: Reported on 11/26/2016) 30 tablet 1  . Ascorbic Acid (VITAMIN C) 100 MG CHEW Chew 1 tablet by mouth daily.    Marland Kitchen aspirin 81 MG chewable tablet Chew 81 mg by mouth daily.    . Cholecalciferol (D3 ADULT PO) Take 1 tablet by mouth daily.    . Cyanocobalamin (B-12 PO) Take 2 tablets by mouth daily. Reported on 05/18/2016    . cyclobenzaprine (FLEXERIL) 10 MG tablet Take 1 tablet (10 mg total) by mouth 3 (three) times daily as needed for muscle spasms. (Patient not taking: Reported on 11/26/2016) 40 tablet 0  . enalapril (VASOTEC) 20 MG tablet Take 2 tablets (40 mg total) by mouth daily. (Patient not taking: Reported on  11/26/2016) 180 tablet 3  . enalapril (VASOTEC) 20 MG tablet Take 2 tablets (40 mg total) by mouth daily. 30 tablet 2  . furosemide (LASIX) 40 MG tablet Take 1 tablet (40 mg total) by mouth daily. 30 tablet 2  . LORazepam (ATIVAN) 1 MG tablet Take 1 tablet (1 mg total) by mouth 2 (two) times daily. (Patient not taking: Reported on 11/26/2016) 60 tablet 0  . Multiple Vitamin (MULTIVITAMIN WITH MINERALS) TABS tablet Take 1 tablet by mouth daily.    . naproxen (NAPROSYN) 500 MG tablet Take 1 tablet (500 mg total) by mouth 2 (two) times daily with a meal. (Patient not taking: Reported on 11/26/2016) 60 tablet 0  . pseudoephedrine (SUDAFED) 60 MG tablet Take 1 tablet (60 mg total) by mouth every 6 (six) hours as needed for congestion. 30 tablet 0   No facility-administered medications prior to visit.     ROS Review of Systems  Respiratory: Negative.   Cardiovascular:       History of elevated blood pressure.  Gastrointestinal: Negative.      Objective:  BP 133/88 (BP Location: Right Arm, Patient Position: Sitting, Cuff Size: Normal)   Pulse 87   Resp 18   Ht 5\' 7"  (1.702 m)   Wt 217 lb 6.4 oz (98.6 kg)   SpO2 97%   BMI 34.05 kg/m   BP/Weight 12/04/2016 11/26/2016 [id number]  Systolic BP [id number]  Diastolic BP 88 [id number]  Wt. (Lbs) 217.4 215  215.8  BMI 34.05 34.7 34.83     Physical Exam  Neck: No JVD present.  Cardiovascular: Normal rate, regular rhythm, normal heart sounds and intact distal pulses.   Pulmonary/Chest: Effort normal and breath sounds normal.  Abdominal: Soft. Bowel sounds are normal.  Psychiatric: Her mood appears anxious. Her speech is rapid and/or pressured. She is agitated and aggressive (verbally).  Nursing note and vitals reviewed.    Assessment & Plan:   Problem List Items Addressed This Visit    -Recommend daily blood pressure measurements and keeping a blood pressure log.   Cardiovascular and Mediastinum   Essential hypertension - Primary   Relevant  Orders   Ambulatory referral to Nephrology      Follow-up: Return as needed.   Alfonse Spruce FNP

## 2017-01-07 ENCOUNTER — Other Ambulatory Visit: Payer: Self-pay | Admitting: Family Medicine

## 2017-01-07 DIAGNOSIS — I10 Essential (primary) hypertension: Secondary | ICD-10-CM

## 2017-01-08 ENCOUNTER — Other Ambulatory Visit: Payer: Self-pay | Admitting: Pharmacist

## 2017-01-08 DIAGNOSIS — I10 Essential (primary) hypertension: Secondary | ICD-10-CM

## 2017-01-08 MED ORDER — ENALAPRIL MALEATE 20 MG PO TABS: 40.0000 mg | ORAL_TABLET | Freq: Every day | ORAL | 2 refills | Status: AC

## 2017-01-22 ENCOUNTER — Telehealth: Payer: Self-pay | Admitting: Adult Health

## 2017-01-22 NOTE — Telephone Encounter (Signed)
[facility] - Client [facility] Patient Name: [name] DOB: 10/26/1961 Initial Comment Caller states has been struggling with her breathing and when walking is having shortness of breath. Nurse Assessment Nurse: Martyn Ehrich RN, Felicia Date/Time (Eastern Time): 01/22/2017 3:53:24 PM Confirm and document reason for call. If symptomatic, describe symptoms. ---SOB with activity worse after last 3 weeks. Dx with CHF 6 months ago. Does the patient have any new or worsening symptoms? ---Yes Will a triage be completed? ---Yes Related visit to physician within the last 2 weeks? ---No Does the PT have any chronic conditions? (i.e. diabetes, asthma, etc.) ---YesList chronic conditions. ---asthma Is this a behavioral health or substance abuse call? ---No Guidelines Guideline Title Affirmed Question Affirmed Notes Chest Pain [1] Chest pain lasts > 5 minutes AND [2] history of heart disease (i.e., heart attack, bypass surgery, angina, angioplasty, CHF; not just a heart murmur) Final Disposition User Call EMS 911 Now [place], RN, [place] Ice Comments talking in sentences tight in chest when she moves around she has swelling in hands and feet all the time which is everyday now reached Verdis Frederickson the Mining engineer - she said that the office if closed atnoon Told caller office is closed at noon Disagree/Comply: Disagree Disagree/Comply Reason: Disagree with instructionsCall Id: [id number]

## 2017-01-22 NOTE — Telephone Encounter (Signed)
Spoke with pt and she is heading to [facility] ED for evaluation. Nothing further needed at this time.

## 2017-02-01 ENCOUNTER — Encounter: Payer: Self-pay | Admitting: Sports Medicine

## 2017-02-01 ENCOUNTER — Other Ambulatory Visit: Payer: Self-pay | Admitting: Sports Medicine

## 2017-02-01 ENCOUNTER — Ambulatory Visit (INDEPENDENT_AMBULATORY_CARE_PROVIDER_SITE_OTHER): Payer: BLUE CROSS/BLUE SHIELD | Admitting: Sports Medicine

## 2017-02-01 DIAGNOSIS — M51369 Other intervertebral disc degeneration, lumbar region without mention of lumbar back pain or lower extremity pain: Secondary | ICD-10-CM | POA: Insufficient documentation

## 2017-02-01 DIAGNOSIS — M5136 Other intervertebral disc degeneration, lumbar region: Secondary | ICD-10-CM | POA: Diagnosis not present

## 2017-02-01 DIAGNOSIS — I10 Essential (primary) hypertension: Secondary | ICD-10-CM

## 2017-02-01 MED ORDER — DIAZEPAM 5 MG PO TABS: ORAL_TABLET | ORAL | 0 refills | Status: DC

## 2017-02-01 NOTE — Assessment & Plan Note (Signed)
Likely reherniation, she did have a discectomy and laminotomy back in 2012 on the right at L5-S1. Repeat MRI, this time with an without IV contrast. Return to go over MRI results, I did discuss with her that an epidural was the likely next option. She had normal renal function earlier this month at [facility].

## 2017-02-01 NOTE — Progress Notes (Signed)
Labs ordered due to HTN diagnosis and MR ordered with contrast.

## 2017-02-01 NOTE — Progress Notes (Signed)
   Subjective:    I'm seeing this patient as a consultation for:  Fredia Beets, FNP  CC: bilateral leg pain  HPI: Ms. Reckner is a 56 y.o. Female with a history of disc herniation presents with bilateral leg pain. The pain on her right leg travels down the medial aspect to her first toe. The pain on her left leg travels down the back of the calf. She describes this pain as a burning/tingling sensation. She denies any fecal/urinary incontinence. Pt also complains of fasciculations of her spinal cord.  Past medical history:  Negative.  See flowsheet/record as well for more information.  Surgical history: Negative.  See flowsheet/record as well for more information.  Family history: Negative.  See flowsheet/record as well for more information.  Social history: Negative.  See flowsheet/record as well for more information.  Allergies, and medications have been entered into the medical record, reviewed, and no changes needed.   Review of Systems: No headache, visual changes, nausea, vomiting, diarrhea, constipation, dizziness, abdominal pain, skin rash, fevers, chills, night sweats, weight loss, swollen lymph nodes, body aches, joint swelling, muscle aches, chest pain, shortness of breath, mood changes, visual or auditory hallucinations.   Objective:   General: Well Developed, well nourished, and in no acute distress.  Neuro/Psych: Alert and oriented x3, extra-ocular muscles intact, able to move all 4 extremities, sensation grossly intact. Skin: Warm and dry, no rashes noted.  Respiratory: Not using accessory muscles, speaking in full sentences, trachea midline.  Cardiovascular: Pulses palpable, no extremity edema. Abdomen: Does not appear distended. Musculoskeletal: Strength 4/5 bilaterally on knee flexion and extension, 3/5 bilaterally on plantar and dorsiflexion of feet. Gross sensation diminished in right 1st toe and in the left achilles region. Pain is most severe in right 1st toe on  palpation. Patellar reflex intact bilaterally, unable to appreciate achilles reflex due to pain on forced plantar flexion.   Impression and Recommendations:   This case required medical decision making of moderate complexity.  Ms. Klar is a 56 y.o. Female with a history of disc herniation who presents with bilateral leg pain consistent with right L4 radiculopathy and left S2 radiculopathy. Pt referred for MRI.   Pt was advised to establish PCP to address her medical problems.

## 2017-02-08 ENCOUNTER — Ambulatory Visit: Payer: BLUE CROSS/BLUE SHIELD | Admitting: Physician Assistant

## 2017-02-15 ENCOUNTER — Ambulatory Visit (INDEPENDENT_AMBULATORY_CARE_PROVIDER_SITE_OTHER): Payer: BLUE CROSS/BLUE SHIELD

## 2017-02-15 DIAGNOSIS — M4607 Spinal enthesopathy, lumbosacral region: Secondary | ICD-10-CM | POA: Diagnosis not present

## 2017-02-15 DIAGNOSIS — M5117 Intervertebral disc disorders with radiculopathy, lumbosacral region: Secondary | ICD-10-CM

## 2017-02-15 DIAGNOSIS — M5116 Intervertebral disc disorders with radiculopathy, lumbar region: Secondary | ICD-10-CM | POA: Diagnosis not present

## 2017-02-15 MED ORDER — GADOBENATE DIMEGLUMINE 529 MG/ML IV SOLN
20.0000 mL | Freq: Once | INTRAVENOUS | Status: AC | PRN
  Administered 2017-02-15: 20 mL via INTRAVENOUS

## 2017-02-19 ENCOUNTER — Ambulatory Visit: Payer: BLUE CROSS/BLUE SHIELD | Admitting: Sports Medicine

## 2017-03-11 ENCOUNTER — Encounter: Payer: Self-pay | Admitting: Sports Medicine

## 2017-03-11 ENCOUNTER — Ambulatory Visit (INDEPENDENT_AMBULATORY_CARE_PROVIDER_SITE_OTHER): Payer: BLUE CROSS/BLUE SHIELD | Admitting: Sports Medicine

## 2017-03-11 DIAGNOSIS — M5136 Other intervertebral disc degeneration, lumbar region: Secondary | ICD-10-CM

## 2017-03-11 DIAGNOSIS — F411 Generalized anxiety disorder: Secondary | ICD-10-CM

## 2017-03-11 DIAGNOSIS — R2241 Localized swelling, mass and lump, right lower limb: Secondary | ICD-10-CM | POA: Insufficient documentation

## 2017-03-11 DIAGNOSIS — M51369 Other intervertebral disc degeneration, lumbar region without mention of lumbar back pain or lower extremity pain: Secondary | ICD-10-CM

## 2017-03-11 MED ORDER — DIAZEPAM 5 MG PO TABS: ORAL_TABLET | ORAL | 0 refills | Status: DC

## 2017-03-11 MED ORDER — DULOXETINE HCL 30 MG PO CPEP: 30.0000 mg | ORAL_CAPSULE | Freq: Every day | ORAL | 3 refills | Status: DC

## 2017-03-11 NOTE — Progress Notes (Signed)
  Subjective:    CC:  Follow-up MRI  HPI: This is a pleasant 56 year old female, she is post-lumbar discectomy with laminotomy back in 2012, she had a recurrence of radicular pain so we obtained new MRI, the results of which will be dictated below. She has persistent bilateral radicular pain, and significant anxiety and depression associated with her symptoms. No bowel or bladder dysfunction, saddle numbness, no constitutional symptoms.  She was very tearful and visibly nervous in the exam room.  Past medical history:  Negative.  See flowsheet/record as well for more information.  Surgical history: Negative.  See flowsheet/record as well for more information.  Family history: Negative.  See flowsheet/record as well for more information.  Social history: Negative.  See flowsheet/record as well for more information.  Allergies, and medications have been entered into the medical record, reviewed, and no changes needed.   Review of Systems: No fevers, chills, night sweats, weight loss, chest pain, or shortness of breath.   Objective:    General: Well Developed, well nourished, and in no acute distress.  Neuro: Alert and oriented x3, extra-ocular muscles intact, sensation grossly intact.  HEENT: Normocephalic, atraumatic, pupils equal round reactive to light, neck supple, no masses, no lymphadenopathy, thyroid nonpalpable.  Skin: Warm and dry, no rashes. Cardiac: Regular rate and rhythm, no murmurs rubs or gallops, no lower extremity edema.  Respiratory: Clear to auscultation bilaterally. Not using accessory muscles, speaking in full sentences.  Lumbar spine MRI personally reviewed, there is a new disc herniation at the L5-S1 level, appears to cause bilateral foraminal stenosis.  Impression and Recommendations:    Lumbar degenerative disc disease New disc herniation at the L5-S1 level, she did have a discectomy and laminotomy back in 2012 on the right at L5-S1. Symptoms are bilateral so we  are going to proceed with a bilateral L5-S1 transforaminal epidural, Valium for preprocedural axial light is, return to see me one month after injection.  Generalized anxiety disorder Starting Cymbalta 30.  Return in one month.  I spent 25 minutes with this patient, greater than 50% was face-to-face time counseling regarding the above diagnoses

## 2017-03-11 NOTE — Assessment & Plan Note (Signed)
Starting Cymbalta 30.  Return in one month.

## 2017-03-11 NOTE — Assessment & Plan Note (Signed)
New disc herniation at the L5-S1 level, she did have a discectomy and laminotomy back in 2012 on the right at L5-S1. Symptoms are bilateral so we are going to proceed with a bilateral L5-S1 transforaminal epidural, Valium for preprocedural axial light is, return to see me one month after injection.

## 2017-03-11 NOTE — Assessment & Plan Note (Deleted)
Surgical excision, return in 10 days for suture removal. 

## 2017-05-12 ENCOUNTER — Encounter: Payer: Self-pay | Admitting: Emergency Medicine

## 2017-05-12 ENCOUNTER — Ambulatory Visit
Admission: EM | Admit: 2017-05-12 | Discharge: 2017-05-12 | Disposition: A | Discharge status: Home / Self Care | Payer: BLUE CROSS/BLUE SHIELD | Attending: Family Medicine | Admitting: Family Medicine

## 2017-05-12 DIAGNOSIS — M940 Chondrocostal junction syndrome [Tietze]: Secondary | ICD-10-CM | POA: Diagnosis not present

## 2017-05-12 DIAGNOSIS — Z8673 Personal history of transient ischemic attack (TIA), and cerebral infarction without residual deficits: Secondary | ICD-10-CM | POA: Insufficient documentation

## 2017-05-12 DIAGNOSIS — I5030 Unspecified diastolic (congestive) heart failure: Secondary | ICD-10-CM | POA: Insufficient documentation

## 2017-05-12 DIAGNOSIS — M5030 Other cervical disc degeneration, unspecified cervical region: Secondary | ICD-10-CM | POA: Insufficient documentation

## 2017-05-12 DIAGNOSIS — E785 Hyperlipidemia, unspecified: Secondary | ICD-10-CM | POA: Insufficient documentation

## 2017-05-12 DIAGNOSIS — M5417 Radiculopathy, lumbosacral region: Secondary | ICD-10-CM | POA: Insufficient documentation

## 2017-05-12 DIAGNOSIS — Z825 Family history of asthma and other chronic lower respiratory diseases: Secondary | ICD-10-CM | POA: Diagnosis not present

## 2017-05-12 DIAGNOSIS — M25512 Pain in left shoulder: Secondary | ICD-10-CM | POA: Insufficient documentation

## 2017-05-12 DIAGNOSIS — Z8249 Family history of ischemic heart disease and other diseases of the circulatory system: Secondary | ICD-10-CM | POA: Insufficient documentation

## 2017-05-12 DIAGNOSIS — F411 Generalized anxiety disorder: Secondary | ICD-10-CM | POA: Diagnosis not present

## 2017-05-12 DIAGNOSIS — R079 Chest pain, unspecified: Secondary | ICD-10-CM | POA: Diagnosis present

## 2017-05-12 DIAGNOSIS — Z833 Family history of diabetes mellitus: Secondary | ICD-10-CM | POA: Diagnosis not present

## 2017-05-12 DIAGNOSIS — M5136 Other intervertebral disc degeneration, lumbar region: Secondary | ICD-10-CM | POA: Diagnosis not present

## 2017-05-12 DIAGNOSIS — Z823 Family history of stroke: Secondary | ICD-10-CM | POA: Insufficient documentation

## 2017-05-12 DIAGNOSIS — Z7982 Long term (current) use of aspirin: Secondary | ICD-10-CM | POA: Insufficient documentation

## 2017-05-12 DIAGNOSIS — Z79899 Other long term (current) drug therapy: Secondary | ICD-10-CM | POA: Diagnosis not present

## 2017-05-12 DIAGNOSIS — R0789 Other chest pain: Secondary | ICD-10-CM | POA: Diagnosis not present

## 2017-05-12 DIAGNOSIS — I110 Hypertensive heart disease with heart failure: Secondary | ICD-10-CM | POA: Diagnosis not present

## 2017-05-12 HISTORY — DX: Cerebral infarction, unspecified: I63.9

## 2017-05-12 MED ORDER — CYCLOBENZAPRINE HCL 10 MG PO TABS: 10.0000 mg | ORAL_TABLET | Freq: Every day | ORAL | 0 refills | Status: DC

## 2017-05-12 NOTE — ED Triage Notes (Signed)
Patient c/o left shoulder pain and chest pain that started yesterday.  Patient reports some SOB.

## 2017-05-12 NOTE — ED Provider Notes (Signed)
[facility]    CSN: [id number] Arrival date & time: 05/12/17  1429     History   Chief Complaint Chief Complaint  Patient presents with  . Chest Pain  . Shoulder Pain    left    HPI [name] is a 56 y.o. female.   56 yo female with a c/o sharp left upper chest and left shoulder pain since yesterday. States pain is intermittent,  worse with movement and occasionally worse with deep breaths. Denies any nausea, vomiting, fevers, chills, cough, jaw pain, radiation, trauma. Patient has a h/o anxiety and fibromyalgia. Also had a recent normal/negative myocardial perfusion stress test 2 months ago.    The history is provided by the patient.  Chest Pain  Shoulder Pain    Past Medical History:  Diagnosis Date  . Anxiety   . Asthma   . Atypical chest pain    in the setting of anxiety  . Back pain   . Chronic pain   . Diastolic heart failure ([place])   . Edema   . Fibromyalgia   . Hypertension   . IBS (irritable bowel syndrome)   . Lupus   . MVP (mitral valve prolapse) 12/22/2011   not confirmed on Echo 08/2015  . Pain in limb   . Positive ANA (antinuclear antibody) 01/29/2015  . S/P dilatation of esophageal stricture 12/22/2011  . Stroke ([place])   . Syncopal episodes   . TIA (transient ischemic attack)     Patient Active Problem List   Diagnosis Date Noted  . Lumbar degenerative disc disease 02/01/2017  . Diastolic heart failure ([place]) 11/11/2016  . Cervical myofascial pain syndrome 01/08/2016  . CTS (carpal tunnel syndrome) 12/05/2015  . Radiculopathy of lumbosacral region 12/05/2015  . Morning headache 08/29/2015  . Daytime somnolence 08/29/2015  . Obesity 08/29/2015  . Snoring 08/29/2015  . Paresthesias 08/29/2015  . Convulsions/seizures ([place]) 08/29/2015  . Altered awareness, transient   . Cervical paraspinal muscle spasm 08/03/2015  . Other specified transient cerebral ischemias   . Essential hypertension   . Noncompliance with medication  regimen   . TIA (transient ischemic attack) 08/02/2015  . Leg swelling 01/29/2015  . Positive ANA (antinuclear antibody) 01/29/2015  . Dizzy spells 01/01/2015  . Shortness of breath 11/20/2014  . DDD (degenerative disc disease), cervical 11/09/2013  . Generalized anxiety disorder 12/27/2012  . 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 Allergic rhinitis 11/27/2012  . Hyperlipidemia 12/22/2011  . MVP (mitral valve prolapse) 12/22/2011  . S/P dilatation of esophageal stricture 12/22/2011  . Atypical chest pain 08/19/2011    Past Surgical History:  Procedure Laterality Date  . ABDOMINAL HYSTERECTOMY    . CARDIAC CATHETERIZATION    . CARDIAC CATHETERIZATION  07/2014   normal coronary arteries [facility])  . SPINE SURGERY    . TONSILLECTOMY      OB History    No data available       Home Medications    Prior to Admission medications   Medication Sig Start Date End Date Taking? Authorizing Provider  chlorthalidone (HYGROTON) 25 MG tablet Take 25 mg by mouth daily.   Yes [provider]  Ascorbic Acid (VITAMIN C) 100 MG CHEW Chew 1 tablet by mouth daily.    [provider]  aspirin 81 MG chewable tablet Chew 81 mg by mouth daily.    [provider]  Cholecalciferol (D3 ADULT PO) Take 1 tablet by mouth daily.    [provider]  Cyanocobalamin (B-12 PO)  Take 2 tablets by mouth daily. Reported on 05/18/2016    [provider]  cyclobenzaprine (FLEXERIL) 10 MG tablet Take 1 tablet (10 mg total) by mouth at bedtime. 05/12/17   Norval Gable, MD  diazepam (VALIUM) 5 MG tablet Take 1 tab PO 1 hour before procedure or imaging. 03/11/17   Silverio Decamp, MD  enalapril (VASOTEC) 20 MG tablet Take 2 tablets (40 mg total) by mouth daily. 01/08/17   Alfonse Spruce, FNP  Multiple Vitamin (MULTIVITAMIN WITH MINERALS) TABS tablet Take 1 tablet by mouth daily.    [provider]    Family History Family History  Problem Relation Age of  Onset  . Diabetes Mother   . Hypertension Mother   . Heart disease Mother   . Arthritis/Rheumatoid Mother   . Diabetes Father   . Hypertension Father   . COPD Father   . Heart attack Father 13  . Emphysema Father   . Stroke Other   . Coronary artery disease Other     Social History Social History  Substance Use Topics  . Smoking status: Never Smoker  . Smokeless tobacco: Never Used  . Alcohol use No     Allergies   Hydroxychloroquine; Gabapentin; Hydrochlorothiazide; Metoprolol; Morphine and related; Other; Pantoprazole sodium; and Prednisone   Review of Systems Review of Systems  Cardiovascular: Positive for chest pain.     Physical Exam Triage Vital Signs ED Triage Vitals  Enc Vitals Group     BP 05/12/17 1438 (!) 147/89     Pulse Rate 05/12/17 1438 73     Resp 05/12/17 1438 16     Temp 05/12/17 1438 97.8 F (36.6 C)     Temp Source 05/12/17 1438 Oral     SpO2 05/12/17 1438 99 %     Weight 05/12/17 1438 212 lb (96.2 kg)     Height 05/12/17 1438 5\' 6"  (1.676 m)     Head Circumference --      Peak Flow --      Pain Score 05/12/17 1439 10     Pain Loc --      Pain Edu? --      Excl. in [place]? --    No data found.   Updated Vital Signs BP (!) 147/89 (BP Location: Left Arm)   Pulse 73   Temp 97.8 F (36.6 C) (Oral)   Resp 16   Ht 5\' 6"  (1.676 m)   Wt 212 lb (96.2 kg)   SpO2 99%   BMI 34.22 kg/m   Visual Acuity Right Eye Distance:   Left Eye Distance:   Bilateral Distance:    Right Eye Near:   Left Eye Near:    Bilateral Near:     Physical Exam  Constitutional: She appears well-developed and well-nourished. No distress.  Cardiovascular: Normal rate, regular rhythm, normal heart sounds and intact distal pulses.   No murmur heard. Pulmonary/Chest: Effort normal and breath sounds normal. No respiratory distress. She has no wheezes. She has no rales. She exhibits tenderness (reproducible tenderness to palpation over the left upper chest area,  below the clavicle).  Musculoskeletal: She exhibits tenderness.       Left shoulder: She exhibits tenderness (over the left deltoid and trapezius muscle). She exhibits normal range of motion, no bony tenderness, no swelling, no effusion, no crepitus, no deformity, no laceration, no pain, no spasm, normal pulse and normal strength.  Skin: She is not diaphoretic.  Nursing note and vitals reviewed.  UC Treatments / Results  Labs (all labs ordered are listed, but only abnormal results are displayed) Labs Reviewed - No data to display  EKG  EKG Interpretation None       Radiology No results found.  Procedures ED EKG Date/Time: 05/12/2017 3:01 PM Performed by: Norval Gable Authorized by: Norval Gable   ECG reviewed by ED Physician in the absence of a cardiologist: yes   Previous ECG:    Previous ECG:  Compared to current   Similarity:  No change Interpretation:    Interpretation: normal   Rate:    ECG rate assessment: normal   Rhythm:    Rhythm: sinus rhythm   Ectopy:    Ectopy: none   QRS:    QRS axis:  Normal Conduction:    Conduction: normal   ST segments:    ST segments:  Normal T waves:    T waves: non-specific     (including critical care time)  Medications Ordered in UC Medications - No data to display   Initial Impression / Assessment and Plan / UC Course  I have reviewed the triage vital signs and the nursing notes.  Pertinent labs & imaging results that were available during my care of the patient were reviewed by me and considered in my medical decision making (see chart for details).       Final Clinical Impressions(s) / UC Diagnoses   Final diagnoses:  Atypical chest pain  Costochondritis  Acute pain of left shoulder    New Prescriptions Discharge Medication List as of 05/12/2017  3:00 PM    START taking these medications   Details  cyclobenzaprine (FLEXERIL) 10 MG tablet Take 1 tablet (10 mg total) by mouth at bedtime.,  Starting Wed 05/12/2017, Normal       1. ekg results and diagnosis reviewed with patient 2. rx as per orders above; reviewed possible side effects, interactions, risks and benefits  3. Recommend supportive treatment with rest, ice, otc tylenol prn 4. Follow-up prn if symptoms worsen or don't improve   Norval Gable, MD 05/12/17 1512

## 2017-06-12 IMAGING — CR DG ANKLE COMPLETE 3+V*R*
3 series · 3 of 3 positions shown · non-contrast
Comparison: None.

CLINICAL DATA: Syncope, fall on right side.  Right ankle pain.

EXAM:
RIGHT ANKLE - COMPLETE 3+ VIEW

[x ankle ap right]
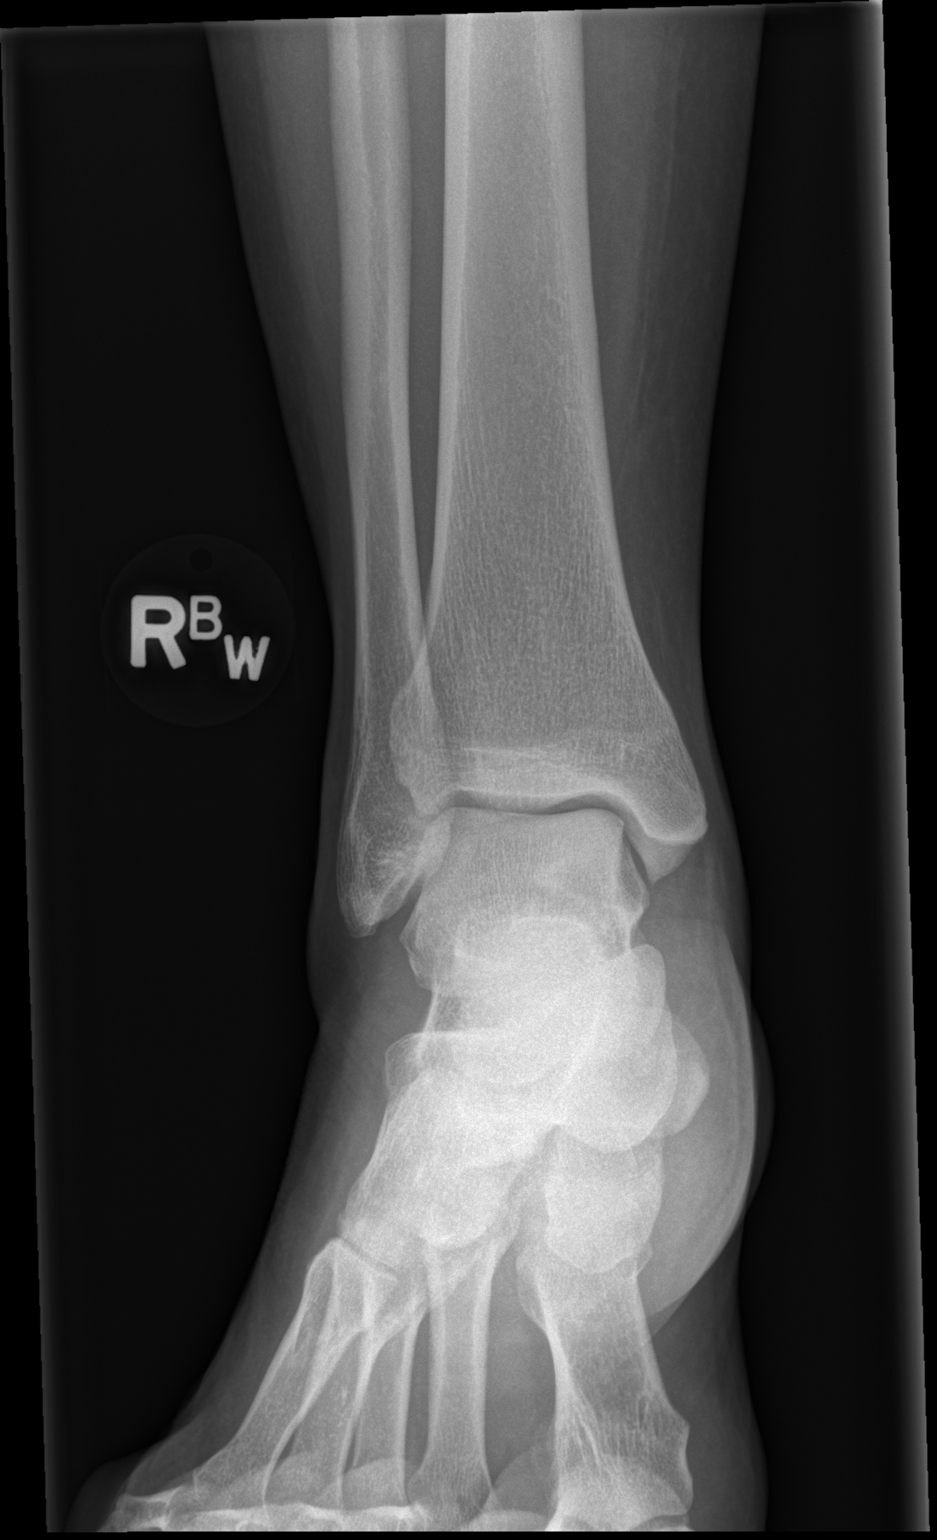

[x ankle obl right]
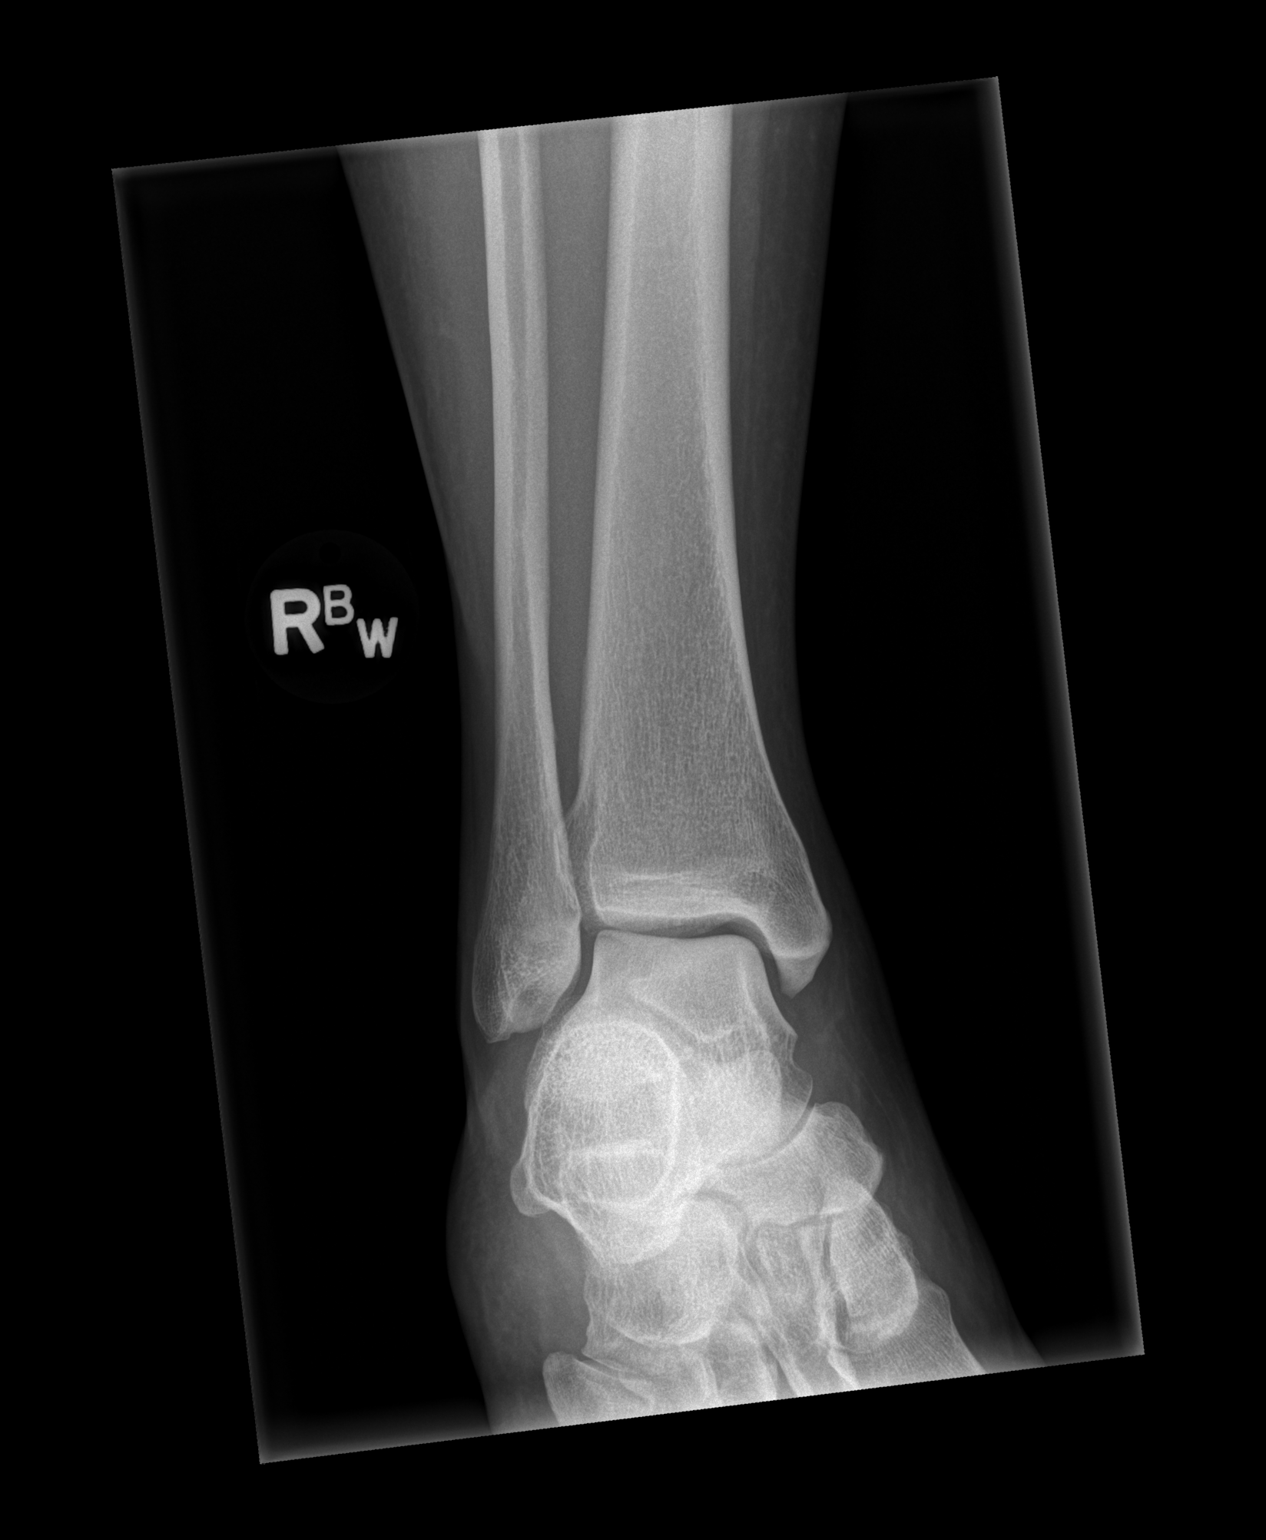

[x ankle lat right]
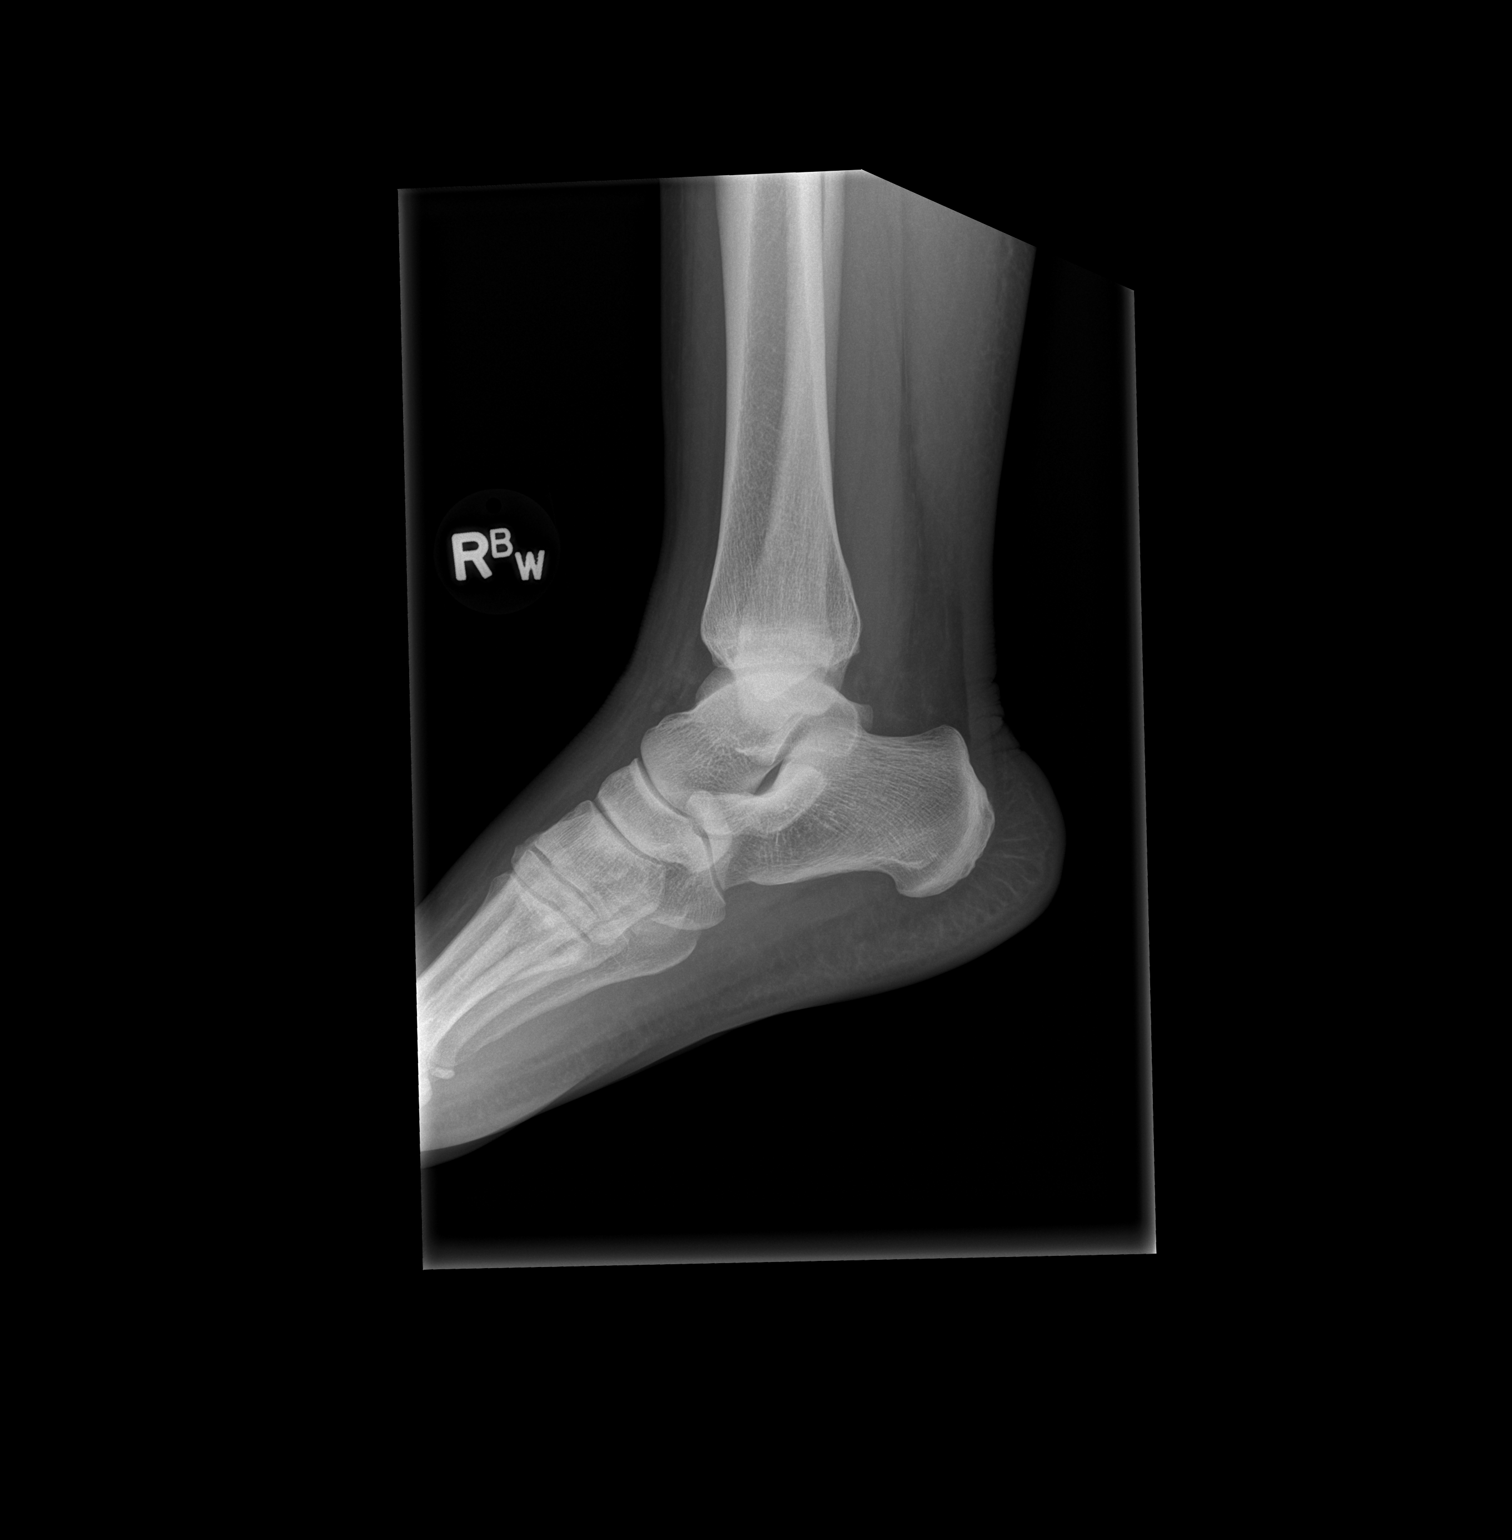

[3 of 3 positions shown; findings below may reference images not displayed]

FINDINGS: There is no evidence of fracture, dislocation, or joint effusion.
There is no evidence of arthropathy or other focal bone abnormality.
Soft tissues are unremarkable.
IMPRESSION: Negative.

## 2017-06-12 IMAGING — CR DG SHOULDER 2+V*R*
3 series · 3 of 3 positions shown · non-contrast
Comparison: None.

CLINICAL DATA: Loss of consciousness, fall on right side. Right
shoulder pain.

EXAM:
RIGHT SHOULDER - 2+ VIEW

[w shoulder external right]
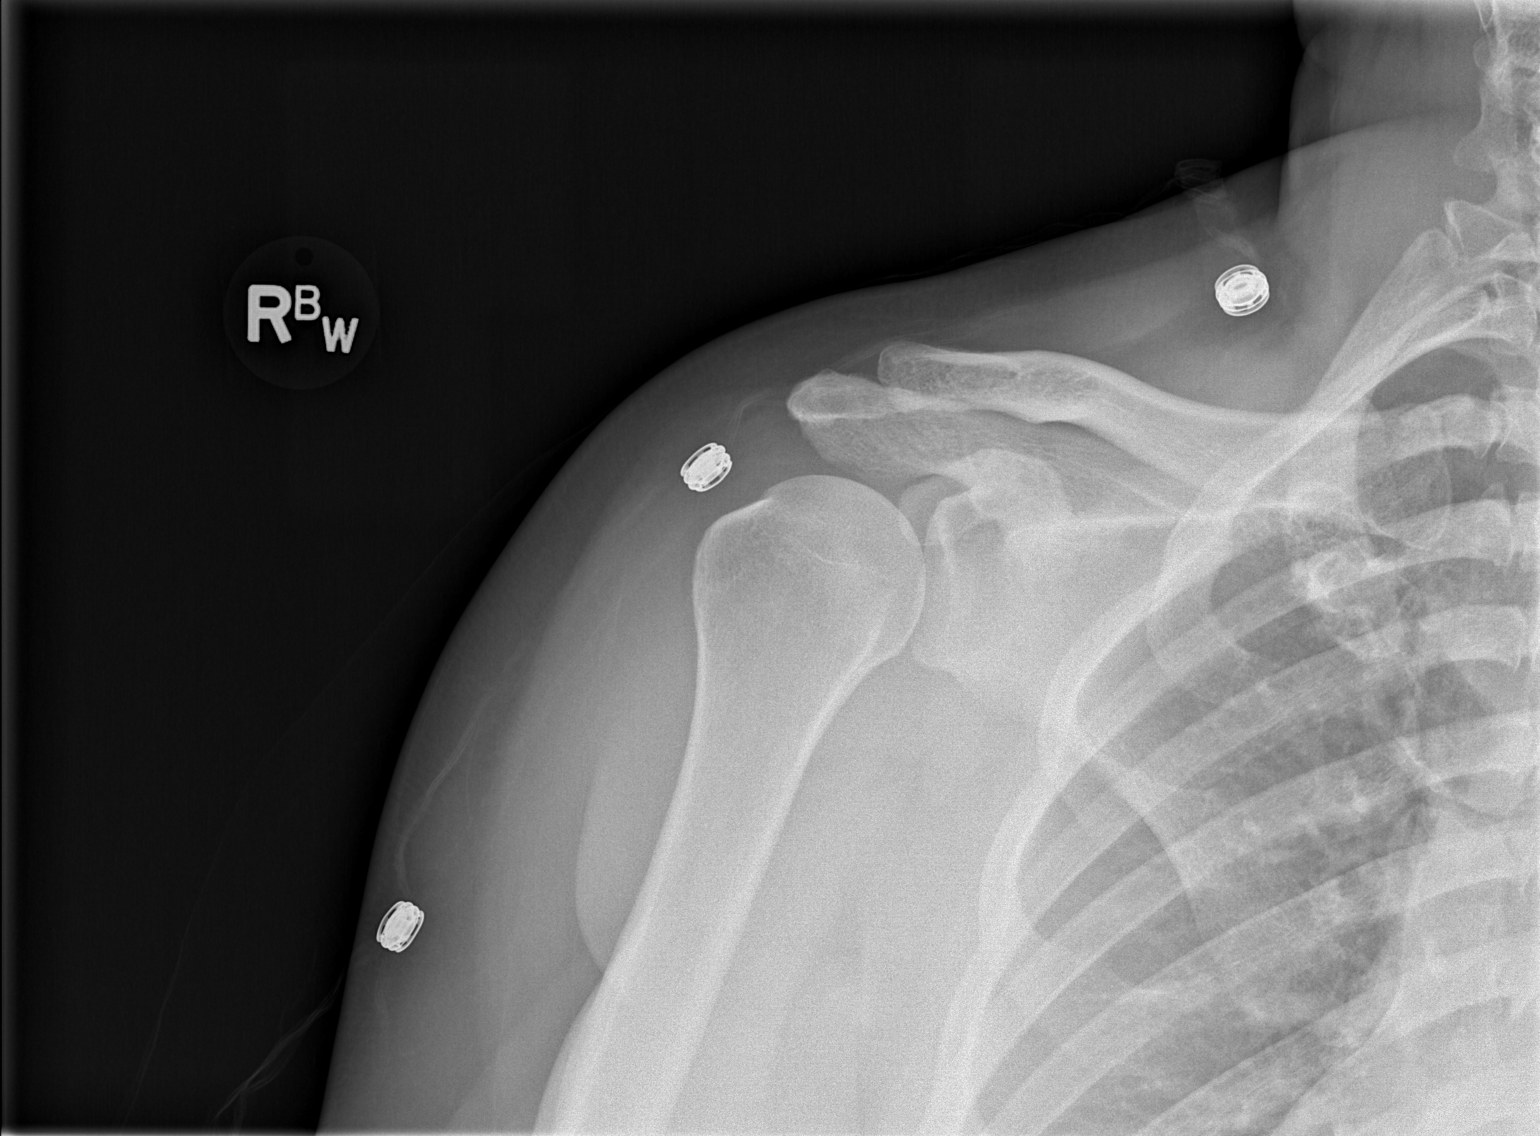

[w shoulder y-view right]
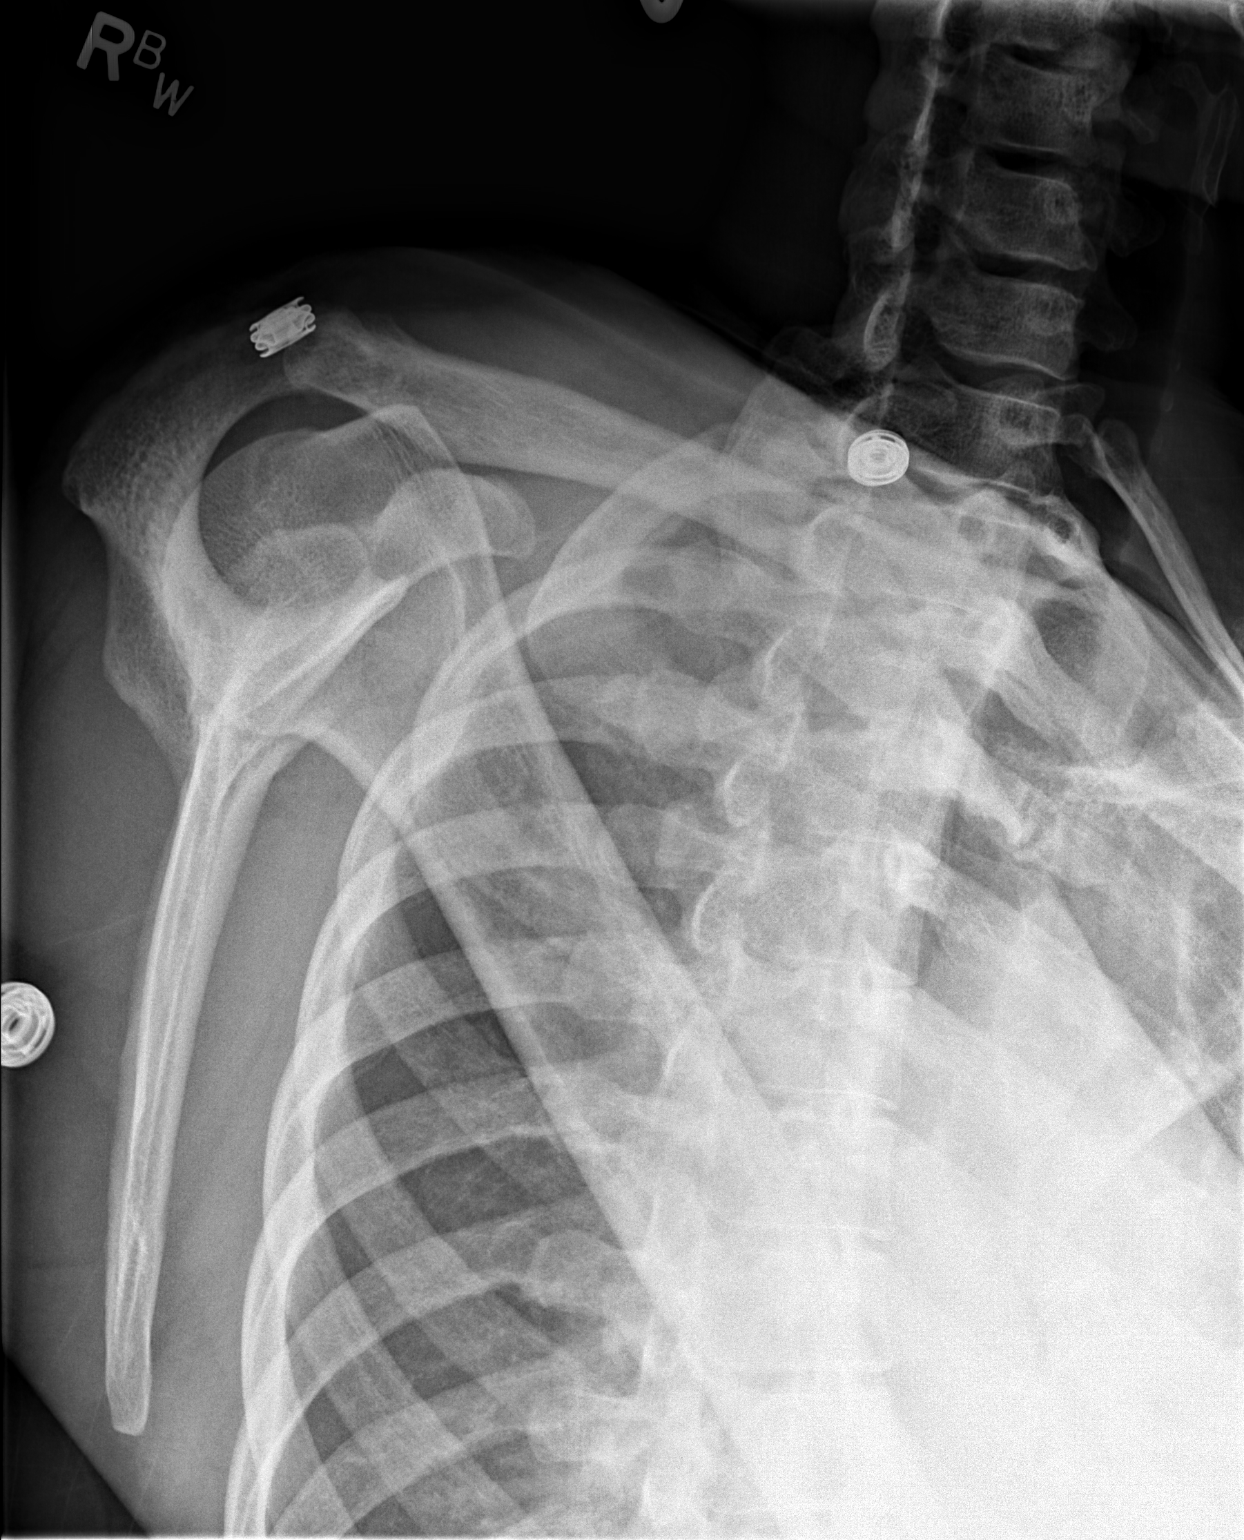

[x shoulder axillary right]
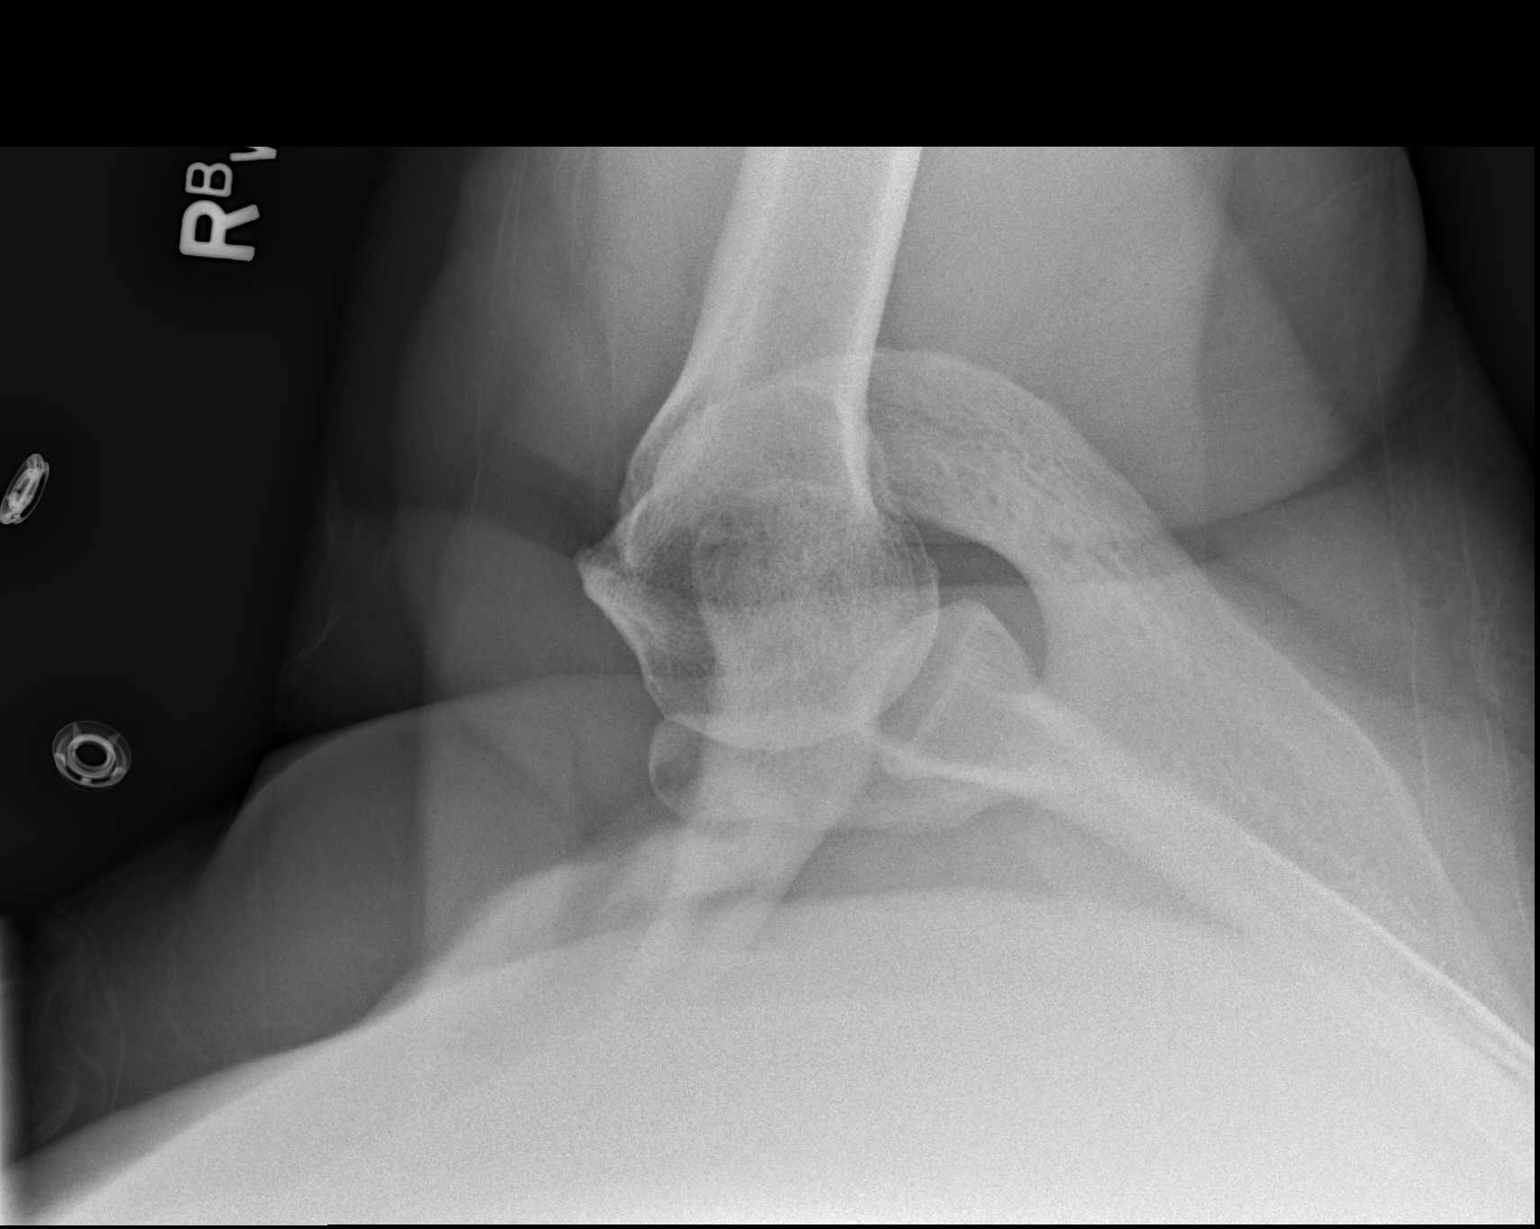

[3 of 3 positions shown; findings below may reference images not displayed]

FINDINGS: There is no evidence of fracture or dislocation. There is no
evidence of arthropathy or other focal bone abnormality. Soft
tissues are unremarkable.
IMPRESSION: Negative.

## 2017-07-02 ENCOUNTER — Encounter: Payer: BLUE CROSS/BLUE SHIELD | Admitting: Certified Nurse Midwife

## 2017-09-14 ENCOUNTER — Encounter (HOSPITAL_COMMUNITY): Payer: Self-pay | Admitting: Emergency Medicine

## 2017-09-14 ENCOUNTER — Emergency Department (HOSPITAL_COMMUNITY): Payer: BC Managed Care – PPO

## 2017-09-14 ENCOUNTER — Other Ambulatory Visit: Payer: Self-pay

## 2017-09-14 ENCOUNTER — Emergency Department (HOSPITAL_COMMUNITY)
Admission: EM | Admit: 2017-09-14 | Discharge: 2017-09-14 | Disposition: A | Discharge status: Home / Self Care | Payer: BC Managed Care – PPO | Attending: Emergency Medicine | Admitting: Emergency Medicine

## 2017-09-14 DIAGNOSIS — Z79899 Other long term (current) drug therapy: Secondary | ICD-10-CM | POA: Insufficient documentation

## 2017-09-14 DIAGNOSIS — Z7982 Long term (current) use of aspirin: Secondary | ICD-10-CM | POA: Insufficient documentation

## 2017-09-14 DIAGNOSIS — I110 Hypertensive heart disease with heart failure: Secondary | ICD-10-CM | POA: Insufficient documentation

## 2017-09-14 DIAGNOSIS — J45909 Unspecified asthma, uncomplicated: Secondary | ICD-10-CM | POA: Diagnosis not present

## 2017-09-14 DIAGNOSIS — R0789 Other chest pain: Secondary | ICD-10-CM | POA: Diagnosis not present

## 2017-09-14 DIAGNOSIS — R42 Dizziness and giddiness: Secondary | ICD-10-CM | POA: Diagnosis not present

## 2017-09-14 DIAGNOSIS — R079 Chest pain, unspecified: Secondary | ICD-10-CM | POA: Diagnosis present

## 2017-09-14 DIAGNOSIS — R002 Palpitations: Secondary | ICD-10-CM

## 2017-09-14 DIAGNOSIS — I5032 Chronic diastolic (congestive) heart failure: Secondary | ICD-10-CM | POA: Diagnosis not present

## 2017-09-14 LAB — BASIC METABOLIC PANEL
Anion gap: 10 (ref 5–15)
BUN: 12 mg/dL (ref 6–20)
CO2: 25 mmol/L (ref 22–32)
CREATININE: 0.81 mg/dL (ref 0.44–1.00)
Calcium: 9.1 mg/dL (ref 8.9–10.3)
Chloride: 103 mmol/L (ref 101–111)
GFR calc Af Amer: >60 mL/min (ref >60)
GLUCOSE: 101 mg/dL — AB (ref 65–99)
Potassium: 3.5 mmol/L (ref 3.5–5.1)
SODIUM: 138 mmol/L (ref 135–145)

## 2017-09-14 LAB — I-STAT TROPONIN, ED
TROPONIN I, POC: 0 ng/mL (ref 0.00–0.08)
Troponin i, poc: 0 ng/mL (ref 0.00–0.08)

## 2017-09-14 LAB — CBC
HCT: 36.4 % (ref 36.0–46.0)
Hemoglobin: 12.2 g/dL (ref 12.0–15.0)
MCH: 30.6 pg (ref 26.0–34.0)
MCHC: 33.5 g/dL (ref 30.0–36.0)
MCV: 91.2 fL (ref 78.0–100.0)
PLATELETS: 180 10*3/uL (ref 150–400)
RBC: 3.99 MIL/uL (ref 3.87–5.11)
RDW: 12.4 % (ref 11.5–15.5)
WBC: 6.8 10*3/uL (ref 4.0–10.5)

## 2017-09-14 LAB — D-DIMER, QUANTITATIVE (NOT AT ARMC): D DIMER QUANT: 0.28 ug{FEU}/mL (ref 0.00–0.50)

## 2017-09-14 LAB — MAGNESIUM: MAGNESIUM: 1.9 mg/dL (ref 1.7–2.4)

## 2017-09-14 LAB — I-STAT BETA HCG BLOOD, ED (MC, WL, AP ONLY): I-stat hCG, quantitative: <5 m[IU]/mL (ref <5)

## 2017-09-14 MED ORDER — MECLIZINE HCL 12.5 MG PO TABS: 12.5000 mg | ORAL_TABLET | Freq: Three times a day (TID) | ORAL | 0 refills | Status: DC | PRN

## 2017-09-14 MED ORDER — CIPROFLOXACIN-DEXAMETHASONE 0.3-0.1 % OT SUSP: 4.0000 [drp] | Freq: Two times a day (BID) | OTIC | 0 refills | Status: DC

## 2017-09-14 MED ORDER — MECLIZINE HCL 25 MG PO TABS
12.5000 mg | ORAL_TABLET | Freq: Once | ORAL | Status: AC
  Administered 2017-09-14: 12.5 mg via ORAL
  Filled 2017-09-14: qty 1

## 2017-09-14 MED ORDER — LORAZEPAM 0.5 MG PO TABS: 0.5000 mg | ORAL_TABLET | Freq: Once | ORAL | Status: DC

## 2017-09-14 MED ORDER — ASPIRIN 81 MG PO CHEW
324.0000 mg | CHEWABLE_TABLET | Freq: Once | ORAL | Status: AC
  Administered 2017-09-14: 324 mg via ORAL
  Filled 2017-09-14: qty 4

## 2017-09-14 NOTE — ED Triage Notes (Signed)
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 Staff states pt had a syncopal episode that lasted about 30 seconds. Upon EMS arrival pt seemed to be in a daze and c/o of chest pain.  Pt states she has felt dizzy for about a week intermittently.  EMS administered 324 ASA pta.  Vitals pta: BP 125/87, HR 86, CBG 127, 100% Sp02 on RA.

## 2017-09-14 NOTE — ED Provider Notes (Signed)
[facility] EMERGENCY DEPARTMENT Provider Note   CSN: [id number] Arrival date & time: 09/14/17  1346     History   Chief Complaint Chief Complaint  Patient presents with  . Chest Pain  . Loss of Consciousness    HPI [name] is a 56 y.o. female.  HPI   56 year old female with extensive past medical history as below including history of mitral valve prolapse, hypertension, fibromyalgia, diastolic heart failure, who presents with multiple complaints.  Her main complaint is acute onset of palpitations today.  She states she has associated sharp, but also pressure-like chest pain.  She felt lightheaded at the time.  This occurred while she was walking at work.  Symptoms resolved over 20-30 minutes.  She denies any ongoing pain or palpitations.  She reports a history of chronic, intermittent palpitations but these are slightly worse than usual.  She also frequently gets chest pain with these.  She previously was on medication and had a Holter monitor, but has not been on any medications recently.  She does states she had a stress test approximately 6 months ago that was normal.  Denies any lower extremity edema.  She does state the pain began acutely and did seem slightly worse with deep inspiration.  The patient also complains of chronic dizziness.  She has a history of frequent ear issues and states that she gets ringing in her bilateral ears, dizziness, and unsteadiness.  The symptoms seem to not bother her when she is still but when she turns her head, she has acute onset of dizziness and unevenness sensation.  Denies any difficulty swallowing or speaking.  Denies any other focal numbness or weakness.  This is been an ongoing issue and she is currently being referred to ENT for this.  She has not seen her PCP for this, however.  Past Medical History:  Diagnosis Date  . Anxiety   . Asthma   . Atypical chest pain    in the setting of anxiety  . Back pain   .  Chronic pain   . Diastolic heart failure ([place])   . Edema   . Fibromyalgia   . Hypertension   . IBS (irritable bowel syndrome)   . Lupus   . MVP (mitral valve prolapse) 12/22/2011   not confirmed on Echo 08/2015  . Pain in limb   . Positive ANA (antinuclear antibody) 01/29/2015  . S/P dilatation of esophageal stricture 12/22/2011  . Stroke ([place])   . Syncopal episodes   . TIA (transient ischemic attack)     Patient Active Problem List   Diagnosis Date Noted  . Lumbar degenerative disc disease 02/01/2017  . Diastolic heart failure ([place]) 11/11/2016  . Cervical myofascial pain syndrome 01/08/2016  . CTS (carpal tunnel syndrome) 12/05/2015  . Radiculopathy of lumbosacral region 12/05/2015  . Morning headache 08/29/2015  . Daytime somnolence 08/29/2015  . Obesity 08/29/2015  . Snoring 08/29/2015  . Paresthesias 08/29/2015  . Convulsions/seizures ([place]) 08/29/2015  . Altered awareness, transient   . Cervical paraspinal muscle spasm 08/03/2015  . Other specified transient cerebral ischemias   . Essential hypertension   . Noncompliance with medication regimen   . TIA (transient ischemic attack) 08/02/2015  . Leg swelling 01/29/2015  . Positive ANA (antinuclear antibody) 01/29/2015  . Dizzy spells 01/01/2015  . Shortness of breath 11/20/2014  . DDD (degenerative disc disease), cervical 11/09/2013  . Generalized anxiety disorder 12/27/2012  . 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 Allergic rhinitis 11/27/2012  .  Hyperlipidemia 12/22/2011  . MVP (mitral valve prolapse) 12/22/2011  . S/P dilatation of esophageal stricture 12/22/2011  . Atypical chest pain 08/19/2011    Past Surgical History:  Procedure Laterality Date  . ABDOMINAL HYSTERECTOMY    . CARDIAC CATHETERIZATION    . CARDIAC CATHETERIZATION  07/2014   normal coronary arteries [facility])  . SPINE SURGERY    . TONSILLECTOMY      OB History    No data available       Home Medications    Prior to Admission medications    Medication Sig Start Date End Date Taking? Authorizing Provider  Ascorbic Acid (VITAMIN C) 100 MG CHEW Chew 1 tablet by mouth daily.    [provider]  aspirin 81 MG chewable tablet Chew 81 mg by mouth daily.    [provider]  chlorthalidone (HYGROTON) 25 MG tablet Take 25 mg by mouth daily.    [provider]  Cholecalciferol (D3 ADULT PO) Take 1 tablet by mouth daily.    [provider]  Cyanocobalamin (B-12 PO) Take 2 tablets by mouth daily. Reported on 05/18/2016    [provider]  cyclobenzaprine (FLEXERIL) 10 MG tablet Take 1 tablet (10 mg total) by mouth at bedtime. 05/12/17   Norval Gable, MD  diazepam (VALIUM) 5 MG tablet Take 1 tab PO 1 hour before procedure or imaging. 03/11/17   Silverio Decamp, MD  enalapril (VASOTEC) 20 MG tablet Take 2 tablets (40 mg total) by mouth daily. 01/08/17   Alfonse Spruce, FNP  Multiple Vitamin (MULTIVITAMIN WITH MINERALS) TABS tablet Take 1 tablet by mouth daily.    [provider]    Family History Family History  Problem Relation Age of Onset  . Diabetes Mother   . Hypertension Mother   . Heart disease Mother   . Arthritis/Rheumatoid Mother   . Diabetes Father   . Hypertension Father   . COPD Father   . Heart attack Father 86  . Emphysema Father   . Stroke Other   . Coronary artery disease Other     Social History Social History   Tobacco Use  . Smoking status: Never Smoker  . Smokeless tobacco: Never Used  Substance Use Topics  . Alcohol use: No    Alcohol/week: 0.0 oz  . Drug use: No     Allergies   Hydroxychloroquine; Gabapentin; Hydrochlorothiazide; Metoprolol; Morphine and related; Other; Pantoprazole sodium; and Prednisone   Review of Systems Review of Systems  Constitutional: Positive for fatigue.  Respiratory: Positive for shortness of breath.   Cardiovascular: Positive for chest pain and palpitations.  Gastrointestinal: Positive for nausea.    Neurological: Positive for dizziness and light-headedness.  All other systems reviewed and are negative.    Physical Exam Updated Vital Signs BP 119/82   Pulse 72   Temp 98.4 F (36.9 C) (Oral)   Resp 20   Ht 5\' 7"  (1.702 m)   Wt 97.1 kg (214 lb)   SpO2 97%   BMI 33.52 kg/m   Physical Exam  Constitutional: She is oriented to person, place, and time. She appears well-developed and well-nourished. No distress.  HENT:  Head: Normocephalic and atraumatic.  Diffuse excoriations of bilateral external auditory canals, with scarring of bilateral tympanic membranes.  No mastoid tenderness or swelling.  No tenderness with manipulation of external ear.  No tympanic membrane erythema or injection.  Eyes: Conjunctivae are normal.  Neck: Neck supple.  Cardiovascular: Normal rate and regular rhythm. Exam reveals  no friction rub.  Murmur (Soft, holosystolic murmur heard best at apex) heard. Pulmonary/Chest: Effort normal and breath sounds normal. No respiratory distress. She has no wheezes. She has no rales.  Abdominal: She exhibits no distension.  Musculoskeletal: She exhibits no edema.  Neurological: She is alert and oriented to person, place, and time. She exhibits normal muscle tone.  Skin: Skin is warm. Capillary refill takes less than 2 seconds.  Psychiatric: She has a normal mood and affect.  Nursing note and vitals reviewed.   Neurological Exam:  Mental Status: Alert and oriented to person, place, and time. Attention and concentration normal. Speech clear. Recent memory is intact. Cranial Nerves: Visual fields grossly intact. EOMI and PERRLA. No nystagmus noted. Facial sensation intact at forehead, maxillary cheek, and chin/mandible bilaterally. No facial asymmetry or weakness. Hearing grossly normal. Uvula is midline, and palate elevates symmetrically. Normal SCM and trapezius strength. Tongue midline without fasciculations. Motor: Muscle strength 5/5 in proximal and distal UE and  LE bilaterally. No pronator drift. Muscle tone normal. Reflexes: 2+ and symmetrical in all four extremities.  Sensation: Intact to light touch in upper and lower extremities distally bilaterally.  Gait: Normal without ataxia. Coordination: Normal FTN bilaterally.      ED Treatments / Results  Labs (all labs ordered are listed, but only abnormal results are displayed) Labs Reviewed  BASIC METABOLIC PANEL - Abnormal; Notable for the following components:      Result Value   Glucose, Bld 101 (*)    All other components within normal limits  CBC  MAGNESIUM  D-DIMER, QUANTITATIVE (NOT AT [facility])  I-STAT TROPONIN, ED  I-STAT BETA HCG BLOOD, ED ([facility], [facility], [facility] ONLY)    EKG  EKG Interpretation  Date/Time:  Tuesday September 14 2017 13:55:15 EST Ventricular Rate:  79 PR Interval:    QRS Duration: 106 QT Interval:  378 QTC Calculation: 434 R Axis:   15 Text Interpretation:  Sinus rhythm Biatrial enlargement RSR' in V1 or V2, probably normal variant No significant change since last tracing Confirmed by Duffy Bruce [phone]) on 09/14/2017 2:28:03 PM       Radiology Ct Head Wo Contrast  Result Date: 09/14/2017 CLINICAL DATA:  Altered level of consciousness. EXAM: CT HEAD WITHOUT CONTRAST TECHNIQUE: Contiguous axial images were obtained from the base of the skull through the vertex without intravenous contrast. COMPARISON:  CT scan of November 26, 2016. FINDINGS: Brain: No evidence of acute infarction, hemorrhage, hydrocephalus, extra-axial collection or mass lesion/mass effect. Vascular: No hyperdense vessel or unexpected calcification. Skull: Normal. Negative for fracture or focal lesion. Sinuses/Orbits: No acute finding. Other: None. IMPRESSION: Normal head CT. Electronically Signed   By: Marijo Conception, M.D.   On: 09/14/2017 15:04    Procedures Procedures (including critical care time)  Medications Ordered in ED Medications  meclizine (ANTIVERT) tablet 12.5 mg (not administered)   aspirin chewable tablet 324 mg (not administered)     Initial Impression / Assessment and Plan / ED Course  I have reviewed the triage vital signs and the nursing notes.  Pertinent labs & imaging results that were available during my care of the patient were reviewed by me and considered in my medical decision making (see chart for details).     56 yo F with PMHx MVP, fibromyalgia, chronic CP here with chest pain, palpitations, and also chronic dizziness.  1.) Chest pain/palpitations: -Suspect this may be symptomatic palpitations, likely from MVP -Less likely ACS - HEART score <3, neg stress test at [facility]  5/18, EKG wnl  -Pt does report +pleurisy, will check D-Dimer, low suspicion, no leg swelling -Pain is not c/w dissection, f/u CXR and D-dimer -No signs of long QT, WPW, Brugada on EKG -No ectopy on tele in ED thus far -Plan: --F/u D-Dimer, CXR, labs --Delta trop --Outpt holter monitor, supportive care (avoid caffeine, encourage hydration) --Outpt f/u at [facility] for repeat TTE as outpt  2.) Chronic dizziness: -History, exam is c/w peripheral vertigo -Suspect this is 2/2 chronic middle/inner ear infection/inflammation, has seen ENT before; tinnitus and aural fullness is suggestive of possible Meniere's -No signs of cerebellar stroke - no dysarthria, dysphagia, ataxia, and neuro exam non-focal -CT head pending, if negative for bleed, mass, or significant middle ear bony destruction/mastoid abnormalities can start meclizine, d/c home from this perspective -No indication for MRI at this time, and this has been ongoing x months to years -Plan --Meclizine --Outpt ENT f/u --Pt has significant irritation to b/l EACs, advised her to stop QTips; can try Ciprodex but has allergy to steroids per record  Final Clinical Impressions(s) / ED Diagnoses   Final diagnoses:  Vertigo  Palpitations  Atypical chest pain    ED Discharge Orders    None       Duffy Bruce, MD 09/14/17  1525

## 2017-09-14 NOTE — ED Provider Notes (Addendum)
  Physical Exam  BP 119/82   Pulse 72   Temp 98.4 F (36.9 C) (Oral)   Resp 20   Ht 5\' 7"  (1.702 m)   Wt 97.1 kg (214 lb)   SpO2 97%   BMI 33.52 kg/m   Physical Exam  ED Course  Procedures  MDM  Assuming care of patient from Dr. Ellender Hose   Patient in the ED for chest discomfort and dizziness. Workup thus far shows neg trops and dimer. CT head and CXR are neg.  Concerning findings are as following none Important pending results are 2nd trop  According to Dr. Ellender Hose, plan is to discharge patient if trops x 2 are neg and d/c with meclizine for outpatient ENT f/u and Cards f/u ([facility]).  Patient had no complains, no concerns from the nursing side. Will continue to monitor.     Varney Biles, MD 09/14/17 1521   6:57 PM Results from the ER workup discussed with the patient face to face and all questions answered to the best of my ability.  Strict ER return precautions have been discussed, and patient is agreeing with the plan and is comfortable with the workup done and the recommendations from the ER.    Varney Biles, MD 09/14/17 [phone]

## 2017-09-14 NOTE — Discharge Instructions (Signed)
Take the medications prescribed. See the outpatient doctors as requested.

## 2017-09-14 NOTE — ED Notes (Signed)
Pt stable,ambulatory, and verbalizes understanding of D/C instructions.   

## 2017-09-26 ENCOUNTER — Other Ambulatory Visit: Payer: Self-pay | Admitting: Family Medicine

## 2017-09-26 DIAGNOSIS — I10 Essential (primary) hypertension: Secondary | ICD-10-CM

## 2018-05-03 ENCOUNTER — Emergency Department
Admission: EM | Admit: 2018-05-03 | Discharge: 2018-05-03 | Disposition: A | Discharge status: Home / Self Care | Payer: BC Managed Care – PPO | Attending: Student in an Organized Health Care Education/Training Program | Admitting: Student in an Organized Health Care Education/Training Program

## 2018-05-03 ENCOUNTER — Encounter: Payer: Self-pay | Admitting: Emergency Medicine

## 2018-05-03 ENCOUNTER — Emergency Department: Payer: BC Managed Care – PPO

## 2018-05-03 ENCOUNTER — Other Ambulatory Visit: Payer: Self-pay

## 2018-05-03 DIAGNOSIS — Z8673 Personal history of transient ischemic attack (TIA), and cerebral infarction without residual deficits: Secondary | ICD-10-CM | POA: Diagnosis not present

## 2018-05-03 DIAGNOSIS — I5030 Unspecified diastolic (congestive) heart failure: Secondary | ICD-10-CM | POA: Diagnosis not present

## 2018-05-03 DIAGNOSIS — I110 Hypertensive heart disease with heart failure: Secondary | ICD-10-CM | POA: Insufficient documentation

## 2018-05-03 DIAGNOSIS — J45909 Unspecified asthma, uncomplicated: Secondary | ICD-10-CM | POA: Diagnosis not present

## 2018-05-03 DIAGNOSIS — R42 Dizziness and giddiness: Secondary | ICD-10-CM | POA: Insufficient documentation

## 2018-05-03 DIAGNOSIS — Z7982 Long term (current) use of aspirin: Secondary | ICD-10-CM | POA: Diagnosis not present

## 2018-05-03 DIAGNOSIS — R0602 Shortness of breath: Secondary | ICD-10-CM | POA: Diagnosis not present

## 2018-05-03 DIAGNOSIS — H538 Other visual disturbances: Secondary | ICD-10-CM | POA: Diagnosis not present

## 2018-05-03 DIAGNOSIS — R079 Chest pain, unspecified: Secondary | ICD-10-CM | POA: Diagnosis not present

## 2018-05-03 DIAGNOSIS — R062 Wheezing: Secondary | ICD-10-CM

## 2018-05-03 DIAGNOSIS — Z79899 Other long term (current) drug therapy: Secondary | ICD-10-CM | POA: Insufficient documentation

## 2018-05-03 LAB — BASIC METABOLIC PANEL
ANION GAP: 7 (ref 5–15)
BUN: 12 mg/dL (ref 6–20)
CALCIUM: 9 mg/dL (ref 8.9–10.3)
CO2: 31 mmol/L (ref 22–32)
CREATININE: 0.76 mg/dL (ref 0.44–1.00)
Chloride: 103 mmol/L (ref 98–111)
Glucose, Bld: 121 mg/dL — ABNORMAL HIGH (ref 70–99)
Potassium: 3.7 mmol/L (ref 3.5–5.1)
Sodium: 141 mmol/L (ref 135–145)

## 2018-05-03 LAB — CBC
HCT: 36.6 % (ref 35.0–47.0)
HEMOGLOBIN: 12.7 g/dL (ref 12.0–16.0)
MCH: 31.5 pg (ref 26.0–34.0)
MCHC: 34.7 g/dL (ref 32.0–36.0)
MCV: 90.9 fL (ref 80.0–100.0)
PLATELETS: 191 10*3/uL (ref 150–440)
RBC: 4.03 MIL/uL (ref 3.80–5.20)
RDW: 13.2 % (ref 11.5–14.5)
WBC: 6.3 10*3/uL (ref 3.6–11.0)

## 2018-05-03 LAB — TROPONIN I

## 2018-05-03 MED ORDER — DEXAMETHASONE 4 MG PO TABS
10.0000 mg | ORAL_TABLET | Freq: Once | ORAL | Status: AC
  Administered 2018-05-03: 10 mg via ORAL
  Filled 2018-05-03: qty 2.5

## 2018-05-03 MED ORDER — LORAZEPAM 0.5 MG PO TABS: 0.5000 mg | ORAL_TABLET | Freq: Three times a day (TID) | ORAL | 0 refills | Status: AC | PRN

## 2018-05-03 MED ORDER — LORAZEPAM 0.5 MG PO TABS: 0.5000 mg | ORAL_TABLET | Freq: Two times a day (BID) | ORAL | 0 refills | Status: DC

## 2018-05-03 MED ORDER — IPRATROPIUM-ALBUTEROL 0.5-2.5 (3) MG/3ML IN SOLN
3.0000 mL | Freq: Once | RESPIRATORY_TRACT | Status: AC
  Administered 2018-05-03: 3 mL via RESPIRATORY_TRACT
  Filled 2018-05-03: qty 3

## 2018-05-03 MED ORDER — ALBUTEROL SULFATE HFA 108 (90 BASE) MCG/ACT IN AERS: 2.0000 | INHALATION_SPRAY | Freq: Four times a day (QID) | RESPIRATORY_TRACT | 2 refills | Status: DC | PRN

## 2018-05-03 NOTE — ED Triage Notes (Signed)
Pt to ED from home c/o dizziness and lightheaded, SOB, chest tightness for approx. 1 month getting worse.  Patient speaking in complete and coherent sentences, chest rise even and unlabored.

## 2018-05-03 NOTE — ED Provider Notes (Signed)
[facility] Emergency Department Provider Note    First MD Initiated Contact with Patient 05/03/18 1815     (approximate)  I have reviewed the triage vital signs and the nursing notes.   HISTORY  Chief Complaint Dizziness and Chest Pain    HPI [name] is a 57 y.o. female with past medical history presents the ER with chief complaint of over 1 month of generalized pressure and discomfort in her chest that she feels has slowly gotten progressively worse and then today got much more significant after she walked outside to the car in the heat after coming from inside [facility].  Denies any chest pain at this time but does feel short of breath. that she feels she has had for over one month. Denies any pain when taking deep inspiration.  No fevers.  Also complains of several months of blurry vision and lightheadedness particular when she gets up in the morning.  States this is unchanged and not new today.  States that she is here for shortness of breath and has follow up with Cardiology clinic tomorrow morning but wanted to get checked out before that..    Past Medical History:  Diagnosis Date  . Anxiety   . Asthma   . Atypical chest pain    in the setting of anxiety  . Back pain   . Chronic pain   . Diastolic heart failure ([place])   . Edema   . Fibromyalgia   . Hypertension   . IBS (irritable bowel syndrome)   . Lupus ([place])   . MVP (mitral valve prolapse) 12/22/2011   not confirmed on Echo 08/2015  . Pain in limb   . Positive ANA (antinuclear antibody) 01/29/2015  . S/P dilatation of esophageal stricture 12/22/2011  . Stroke ([place])   . Syncopal episodes   . TIA (transient ischemic attack)    Family History  Problem Relation Age of Onset  . Diabetes Mother   . Hypertension Mother   . Heart disease Mother   . Arthritis/Rheumatoid Mother   . Diabetes Father   . Hypertension Father   . COPD Father   . Heart attack Father 76  . Emphysema Father   .  Stroke Other   . Coronary artery disease Other    Past Surgical History:  Procedure Laterality Date  . ABDOMINAL HYSTERECTOMY    . CARDIAC CATHETERIZATION    . CARDIAC CATHETERIZATION  07/2014   normal coronary arteries [facility])  . SPINE SURGERY    . TONSILLECTOMY     Patient Active Problem List   Diagnosis Date Noted  . Lumbar degenerative disc disease 02/01/2017  . Diastolic heart failure ([place]) 11/11/2016  . Cervical myofascial pain syndrome 01/08/2016  . CTS (carpal tunnel syndrome) 12/05/2015  . Radiculopathy of lumbosacral region 12/05/2015  . Morning headache 08/29/2015  . Daytime somnolence 08/29/2015  . Obesity 08/29/2015  . Snoring 08/29/2015  . Paresthesias 08/29/2015  . Convulsions/seizures ([place]) 08/29/2015  . Altered awareness, transient   . Cervical paraspinal muscle spasm 08/03/2015  . Other specified transient cerebral ischemias   . Essential hypertension   . Noncompliance with medication regimen   . TIA (transient ischemic attack) 08/02/2015  . Leg swelling 01/29/2015  . Positive ANA (antinuclear antibody) 01/29/2015  . Dizzy spells 01/01/2015  . Shortness of breath 11/20/2014  . DDD (degenerative disc disease), cervical 11/09/2013  . Generalized anxiety disorder 12/27/2012  . 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 Allergic rhinitis 11/27/2012  . Hyperlipidemia  12/22/2011  . MVP (mitral valve prolapse) 12/22/2011  . S/P dilatation of esophageal stricture 12/22/2011  . Atypical chest pain 08/19/2011      Prior to Admission medications   Medication Sig Start Date End Date Taking? Authorizing Provider  albuterol (PROVENTIL HFA;VENTOLIN HFA) 108 (90 Base) MCG/ACT inhaler Inhale 2 puffs into the lungs every 6 (six) hours as needed for wheezing or shortness of breath. 05/03/18   Merlyn Lot, MD  aspirin 81 MG chewable tablet Chew 81 mg by mouth daily.    [provider]  chlorthalidone (HYGROTON) 25 MG tablet Take 25 mg by mouth daily.    [provider]  ciprofloxacin-dexamethasone (CIPRODEX) OTIC suspension Place 4 drops 2 (two) times daily into both ears. 09/14/17   Varney Biles, MD  cyclobenzaprine (FLEXERIL) 10 MG tablet Take 1 tablet (10 mg total) by mouth at bedtime. Patient not taking: Reported on 09/14/2017 05/12/17   Norval Gable, MD  diazepam (VALIUM) 5 MG tablet Take 1 tab PO 1 hour before procedure or imaging. Patient not taking: Reported on 09/14/2017 03/11/17   Silverio Decamp, MD  enalapril (VASOTEC) 20 MG tablet Take 2 tablets (40 mg total) by mouth daily. Patient taking differently: Take 20 mg daily by mouth.  01/08/17   Hairston, Maylon Peppers, FNP  LORazepam (ATIVAN) 0.5 MG tablet Take 1 tablet (0.5 mg total) by mouth every 8 (eight) hours as needed for anxiety. 05/03/18 05/03/19  Merlyn Lot, MD  meclizine (ANTIVERT) 12.5 MG tablet Take 1 tablet (12.5 mg total) 3 (three) times daily as needed by mouth for dizziness. 09/14/17   Varney Biles, MD    Allergies Hydroxychloroquine; Gabapentin; Hydrochlorothiazide; Metoprolol; Morphine and related; Other; Pantoprazole sodium; and Prednisone    Social History Social History   Tobacco Use  . Smoking status: Never Smoker  . Smokeless tobacco: Never Used  Substance Use Topics  . Alcohol use: No    Alcohol/week: 0.0 oz  . Drug use: No    Review of Systems Patient denies headaches, rhinorrhea, blurry vision, numbness, shortness of breath, chest pain, edema, cough, abdominal pain, nausea, vomiting, diarrhea, dysuria, fevers, rashes or hallucinations unless otherwise stated above in HPI. ____________________________________________   PHYSICAL EXAM:  VITAL SIGNS: Vitals:   05/03/18 1755  BP: (!) 184/95  Pulse: (!) 56  Resp: (!) 22  Temp: 97.8 F (36.6 C)  SpO2: 99%    Constitutional: Alert and oriented.  Eyes: Conjunctivae are normal.  Head: Atraumatic. Nose: No congestion/rhinnorhea. Mouth/Throat: Mucous membranes are moist.   Neck:  No stridor. Painless ROM.  Cardiovascular: Normal rate, regular rhythm. Grossly normal heart sounds.  Good peripheral circulation. Respiratory: Normal respiratory effort.  No retractions. Lungs with diffuse expiratory wheeze throughout. Gastrointestinal: Soft and nontender. No distention. No abdominal bruits. No CVA tenderness. Genitourinary:  Musculoskeletal: No lower extremity tenderness nor edema.  No joint effusions. Neurologic:  Normal speech and language. No gross focal neurologic deficits are appreciated. No facial droop Skin:  Skin is warm, dry and intact. No rash noted. Psychiatric: Mood and affect are normal. Speech and behavior are normal.  ____________________________________________   LABS (all labs ordered are listed, but only abnormal results are displayed)  Results for orders placed or performed during the hospital encounter of 05/03/18 (from the past 24 hour(s))  Basic metabolic panel     Status: Abnormal   Collection Time: 05/03/18  6:10 PM  Result Value Ref Range   Sodium 141 135 - 145 mmol/L   Potassium 3.7 3.5 -  5.1 mmol/L   Chloride 103 98 - 111 mmol/L   CO2 31 22 - 32 mmol/L   Glucose, Bld 121 (H) 70 - 99 mg/dL   BUN 12 6 - 20 mg/dL   Creatinine, Ser 0.76 0.44 - 1.00 mg/dL   Calcium 9.0 8.9 - 10.3 mg/dL   GFR calc non Af Amer >60 >60 mL/min   GFR calc Af Amer >60 >60 mL/min   Anion gap 7 5 - 15  CBC     Status: None   Collection Time: 05/03/18  6:10 PM  Result Value Ref Range   WBC 6.3 3.6 - 11.0 K/uL   RBC 4.03 3.80 - 5.20 MIL/uL   Hemoglobin 12.7 12.0 - 16.0 g/dL   HCT 36.6 35.0 - 47.0 %   MCV 90.9 80.0 - 100.0 fL   MCH 31.5 26.0 - 34.0 pg   MCHC 34.7 32.0 - 36.0 g/dL   RDW 13.2 11.5 - 14.5 %   Platelets 191 150 - 440 K/uL  Troponin I     Status: None   Collection Time: 05/03/18  6:10 PM  Result Value Ref Range   Troponin I <0.03 <0.03 ng/mL   ____________________________________________  [id number] My review and personal interpretation at  Time: 18:04  Indication: sob/cp  Rate: 60  Rhythm: sinus Axis: normal Other: no stemi, normal intervals ____________________________________________  RADIOLOGY  I personally reviewed all radiographic images ordered to evaluate for the above acute complaints and reviewed radiology reports and findings.  These findings were personally discussed with the patient.  Please see medical record for radiology report.____________________________________________   PROCEDURES  Procedure(s) performed:  Procedures    Critical Care performed: no ____________________________________________   INITIAL IMPRESSION / ASSESSMENT AND PLAN / ED COURSE  Pertinent labs & imaging results that were available during my care of the patient were reviewed by me and considered in my medical decision making (see chart for details).   DDX: Asthma, copd, CHF, pna, ptx, malignancy, Pe, anemia   [name] is a 57 y.o. who presents to the ED with symptoms as described above.  Patient's symptoms have been ongoing for 1 month.  Does not seem clinically consistent with congestive heart failure or ACS.  Troponin is negative.  No evidence of infectious process or pneumonia.  This not clinically consistent with PE and given her expiratory wheeze I do suspect some component of underlying asthma or bronchitis for which she will receive DuoNeb.  Symptoms being provoked by sudden change in temperature while going out to her car midday I do suspect that this is consistent with bronchitis.  Patient has follow-up with cardiology in the morning.  At this point do believe patient stable and appropriate for outpatient follow-up.  Clinical Course as of May 03 1949  Tue May 03, 2018  1940 Patient reassessed with improvement in air movement after nebulizer treatment.  Patient also admits to having worsening symptoms secondary to stress.  Based on duration of symptoms I do not feel that further diagnostic testing clinically indicated  particular incident that she has follow-up with cardiology tomorrow morning does not have any active chest pain, no evidence of congestive heart failure, no evidence of ACS with reassuring blood work and reassuring exam.  Will be given prescription for albuterol.  Will give small as needed trial of benzodiazepine.  Discussed signs and symptoms which the patient should return to the ER.   [PR]    Clinical Course User Index [PR] Merlyn Lot, MD  As part of my medical decision making, I reviewed the following data within the [facility] notes reviewed and incorporated, Labs reviewed, notes from prior ED visits.   ____________________________________________   FINAL CLINICAL IMPRESSION(S) / ED DIAGNOSES  Final diagnoses:  Chest pain, unspecified type  Wheezing on exhalation      NEW MEDICATIONS STARTED DURING THIS VISIT:  New Prescriptions   ALBUTEROL (PROVENTIL HFA;VENTOLIN HFA) 108 (90 BASE) MCG/ACT INHALER    Inhale 2 puffs into the lungs every 6 (six) hours as needed for wheezing or shortness of breath.   LORAZEPAM (ATIVAN) 0.5 MG TABLET    Take 1 tablet (0.5 mg total) by mouth every 8 (eight) hours as needed for anxiety.     Note:  This document was prepared using Dragon voice recognition software and may include unintentional dictation errors.    Merlyn Lot, MD 05/03/18 1950

## 2018-09-19 ENCOUNTER — Other Ambulatory Visit: Payer: Self-pay

## 2018-09-19 ENCOUNTER — Emergency Department
Admission: EM | Admit: 2018-09-19 | Discharge: 2018-09-19 | Disposition: A | Discharge status: Home / Self Care | Payer: BC Managed Care – PPO | Attending: Student in an Organized Health Care Education/Training Program | Admitting: Student in an Organized Health Care Education/Training Program

## 2018-09-19 ENCOUNTER — Encounter: Payer: Self-pay | Admitting: Emergency Medicine

## 2018-09-19 ENCOUNTER — Emergency Department: Payer: BC Managed Care – PPO

## 2018-09-19 DIAGNOSIS — Z79899 Other long term (current) drug therapy: Secondary | ICD-10-CM | POA: Insufficient documentation

## 2018-09-19 DIAGNOSIS — Z7982 Long term (current) use of aspirin: Secondary | ICD-10-CM | POA: Diagnosis not present

## 2018-09-19 DIAGNOSIS — I509 Heart failure, unspecified: Secondary | ICD-10-CM | POA: Insufficient documentation

## 2018-09-19 DIAGNOSIS — M5442 Lumbago with sciatica, left side: Secondary | ICD-10-CM | POA: Diagnosis not present

## 2018-09-19 DIAGNOSIS — I110 Hypertensive heart disease with heart failure: Secondary | ICD-10-CM | POA: Diagnosis not present

## 2018-09-19 DIAGNOSIS — Z8673 Personal history of transient ischemic attack (TIA), and cerebral infarction without residual deficits: Secondary | ICD-10-CM | POA: Insufficient documentation

## 2018-09-19 DIAGNOSIS — J45909 Unspecified asthma, uncomplicated: Secondary | ICD-10-CM | POA: Diagnosis not present

## 2018-09-19 DIAGNOSIS — G8929 Other chronic pain: Secondary | ICD-10-CM

## 2018-09-19 DIAGNOSIS — M545 Low back pain: Secondary | ICD-10-CM | POA: Diagnosis present

## 2018-09-19 DIAGNOSIS — K5900 Constipation, unspecified: Secondary | ICD-10-CM | POA: Diagnosis not present

## 2018-09-19 MED ORDER — MAGNESIUM CITRATE PO SOLN
1.0000 | Freq: Once | ORAL | Status: AC
  Administered 2018-09-19: 1 via ORAL
  Filled 2018-09-19: qty 296

## 2018-09-19 MED ORDER — SENNOSIDES-DOCUSATE SODIUM 8.6-50 MG PO TABS: 1.0000 | ORAL_TABLET | Freq: Every day | ORAL | 0 refills | Status: DC

## 2018-09-19 MED ORDER — METHOCARBAMOL 500 MG PO TABS: 500.0000 mg | ORAL_TABLET | Freq: Four times a day (QID) | ORAL | 0 refills | Status: DC

## 2018-09-19 MED ORDER — MELOXICAM 15 MG PO TABS: 15.0000 mg | ORAL_TABLET | Freq: Every day | ORAL | 0 refills | Status: DC

## 2018-09-19 MED ORDER — DOCUSATE SODIUM 50 MG/5ML PO LIQD
50.0000 mg | Freq: Once | ORAL | Status: AC
  Administered 2018-09-19: 50 mg via ORAL
  Filled 2018-09-19: qty 10

## 2018-09-19 MED ORDER — MAGNESIUM CITRATE PO SOLN: 1.0000 | Freq: Once | ORAL | 0 refills | Status: AC

## 2018-09-19 NOTE — ED Notes (Addendum)
See triage note  Presents with lower back pain  States pain is mainly on the left and moves into left buttocks  Pain started about 2-3 days ago Also has been having some diff with bowel movements

## 2018-09-19 NOTE — ED Provider Notes (Signed)
[facility] Emergency Department Provider Note  ____________________________________________  Time seen: Approximately 6:42 PM  I have reviewed the triage vital signs and the nursing notes.   HISTORY  Chief Complaint Back Pain    HPI [name] is a 57 y.o. female who presents the emergency department complaining of lower back pain and constipation.  Patient has a long history of lower back and neck issues.  Patient is currently seeing neurosurgery at [facility] for same.  Patient reports that she has had increasing left-sided lower back pain with radiation into the left hip.  No traumas.  She denies any bowel or bladder dysfunction, saddle anesthesia or paresthesias.  Patient does take muscle relaxers but states that even with a regular muscle relaxer she reports tightness in the lower back.  Patient states that given multiple symptoms, she is unable to lay flat and spends most of the nights in a recliner or on the couch.  Patient is concerned that sleeping posture may contribute to some of the symptoms.  Patient is also complaining of constipation.  She reports that she has a history of IBS, occasionally has issues with constipation.  She is tried prune juice, MiraLAX with no relief.  She denies any abdominal pain, rectal bleeding, nausea, vomiting.  Patient reports that constipation began around the same time that her lower back pain started to increase.  Patient denies any systemic complaints of fevers or chills.  No chest pain, shortness of breath, abdominal pain, nausea or vomiting.  Other than prune juice and MiraLAX, no medications for this complaint.  She is on a daily muscle relaxer.  Patient with significant medical history to include anxiety, asthma, IBS, chronic neck and back pain, diastolic heart failure, fibromyalgia, hypertension, lupus, TIA.  No complaints at this time  with chronic medical conditions other than lower back pain and constipation.   Past  Medical History:  Diagnosis Date  . Anxiety   . Asthma   . Atypical chest pain    in the setting of anxiety  . Back pain   . Chronic pain   . Diastolic heart failure ([place])   . Edema   . Fibromyalgia   . Hypertension   . IBS (irritable bowel syndrome)   . Lupus ([place])   . MVP (mitral valve prolapse) 12/22/2011   not confirmed on Echo 08/2015  . Pain in limb   . Positive ANA (antinuclear antibody) 01/29/2015  . S/P dilatation of esophageal stricture 12/22/2011  . Stroke ([place])   . Syncopal episodes   . TIA (transient ischemic attack)     Patient Active Problem List   Diagnosis Date Noted  . Lumbar degenerative disc disease 02/01/2017  . Diastolic heart failure ([place]) 11/11/2016  . Cervical myofascial pain syndrome 01/08/2016  . CTS (carpal tunnel syndrome) 12/05/2015  . Radiculopathy of lumbosacral region 12/05/2015  . Morning headache 08/29/2015  . Daytime somnolence 08/29/2015  . Obesity 08/29/2015  . Snoring 08/29/2015  . Paresthesias 08/29/2015  . Convulsions/seizures ([place]) 08/29/2015  . Altered awareness, transient   . Cervical paraspinal muscle spasm 08/03/2015  . Other specified transient cerebral ischemias   . Essential hypertension   . Noncompliance with medication regimen   . TIA (transient ischemic attack) 08/02/2015  . Leg swelling 01/29/2015  . Positive ANA (antinuclear antibody) 01/29/2015  . Dizzy spells 01/01/2015  . Shortness of breath 11/20/2014  . DDD (degenerative disc disease), cervical 11/09/2013  . Generalized anxiety disorder 12/27/2012  . 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  Allergic rhinitis 11/27/2012  . Hyperlipidemia 12/22/2011  . MVP (mitral valve prolapse) 12/22/2011  . S/P dilatation of esophageal stricture 12/22/2011  . Atypical chest pain 08/19/2011    Past Surgical History:  Procedure Laterality Date  . ABDOMINAL HYSTERECTOMY    . CARDIAC CATHETERIZATION    . CARDIAC CATHETERIZATION  07/2014   normal coronary arteries [facility])  .  SPINE SURGERY    . TONSILLECTOMY      Prior to Admission medications   Medication Sig Start Date End Date Taking? Authorizing Provider  albuterol (PROVENTIL HFA;VENTOLIN HFA) 108 (90 Base) MCG/ACT inhaler Inhale 2 puffs into the lungs every 6 (six) hours as needed for wheezing or shortness of breath. 05/03/18   Merlyn Lot, MD  aspirin 81 MG chewable tablet Chew 81 mg by mouth daily.    [provider]  chlorthalidone (HYGROTON) 25 MG tablet Take 25 mg by mouth daily.    [provider]  ciprofloxacin-dexamethasone (CIPRODEX) OTIC suspension Place 4 drops 2 (two) times daily into both ears. 09/14/17   Varney Biles, MD  cyclobenzaprine (FLEXERIL) 10 MG tablet Take 1 tablet (10 mg total) by mouth at bedtime. Patient not taking: Reported on 09/14/2017 05/12/17   Norval Gable, MD  diazepam (VALIUM) 5 MG tablet Take 1 tab PO 1 hour before procedure or imaging. Patient not taking: Reported on 09/14/2017 03/11/17   Silverio Decamp, MD  enalapril (VASOTEC) 20 MG tablet Take 2 tablets (40 mg total) by mouth daily. Patient taking differently: Take 20 mg daily by mouth.  01/08/17   Hairston, Maylon Peppers, FNP  LORazepam (ATIVAN) 0.5 MG tablet Take 1 tablet (0.5 mg total) by mouth every 8 (eight) hours as needed for anxiety. 05/03/18 05/03/19  Merlyn Lot, MD  magnesium citrate SOLN Take 296 mLs (1 Bottle total) by mouth once for 1 dose. 09/19/18 09/19/18  Suhaila Troiano, Charline Bills, PA-C  meclizine (ANTIVERT) 12.5 MG tablet Take 1 tablet (12.5 mg total) 3 (three) times daily as needed by mouth for dizziness. 09/14/17   Varney Biles, MD  meloxicam (MOBIC) 15 MG tablet Take 1 tablet (15 mg total) by mouth daily. 09/19/18   Decarlos Empey, Charline Bills, PA-C  methocarbamol (ROBAXIN) 500 MG tablet Take 1 tablet (500 mg total) by mouth 4 (four) times daily. 09/19/18   Nathen Balaban, Charline Bills, PA-C  senna-docusate (SENOKOT-S) 8.6-50 MG tablet Take 1 tablet by mouth daily. 09/19/18   Sevon Rotert,  Charline Bills, PA-C    Allergies Hydroxychloroquine; Gabapentin; Hydrochlorothiazide; Metoprolol; Morphine and related; Other; Pantoprazole sodium; and Prednisone  Family History  Problem Relation Age of Onset  . Diabetes Mother   . Hypertension Mother   . Heart disease Mother   . Arthritis/Rheumatoid Mother   . Diabetes Father   . Hypertension Father   . COPD Father   . Heart attack Father 66  . Emphysema Father   . Stroke Other   . Coronary artery disease Other     Social History Social History   Tobacco Use  . Smoking status: Never Smoker  . Smokeless tobacco: Never Used  Substance Use Topics  . Alcohol use: No    Alcohol/week: 0.0 standard drinks  . Drug use: No     Review of Systems  Constitutional: No fever/chills Eyes: No visual changes.  Cardiovascular: no chest pain. Respiratory: no cough. No SOB. Gastrointestinal: No abdominal pain.  No nausea, no vomiting.  No diarrhea.  Positive constipation. Genitourinary: Negative for dysuria. No hematuria Musculoskeletal: Lower back pain radiating into the left  hip Skin: Negative for rash, abrasions, lacerations, ecchymosis. Neurological: Negative for headaches, focal weakness or numbness. 10-point ROS otherwise negative.  ____________________________________________   PHYSICAL EXAM:  VITAL SIGNS: ED Triage Vitals [09/19/18 1748]  Enc Vitals Group     BP (!) 150/94     Pulse Rate 64     Resp 18     Temp 97.7 F (36.5 C)     Temp Source Oral     SpO2 98 %     Weight 219 lb (99.3 kg)     Height 5\' 7"  (1.702 m)     Head Circumference      Peak Flow      Pain Score 10     Pain Loc      Pain Edu?      Excl. in [place]?      Constitutional: Alert and oriented. Well appearing and in no acute distress. Eyes: Conjunctivae are normal. PERRL. EOMI. Head: Atraumatic. Neck: No stridor.    Cardiovascular: Normal rate, regular rhythm. Normal S1 and S2.  Good peripheral circulation. Respiratory: Normal respiratory  effort without tachypnea or retractions. Lungs CTAB. Good air entry to the bases with no decreased or absent breath sounds. Gastrointestinal: No visible abdominal wall abnormality.  Bowel sounds 4 quadrants. Soft and nontender to palpation all quadrants.. No guarding or rigidity. No palpable masses. No distention. No CVA tenderness. Musculoskeletal: Full range of motion to all extremities. No gross deformities appreciated.  Visualization of the lumbar spine reveals no visible abnormality.  Nontender to palpation midline, diffuse tenderness to palpation left paraspinal muscle group into the left-sided sciatic notch.  No tenderness to palpation over the left hip.  Dorsalis pedis pulse intact bilateral lower extremities.  Sensation intact and equal bilateral lower extremities. Neurologic:  Normal speech and language. No gross focal neurologic deficits are appreciated.  Skin:  Skin is warm, dry and intact. No rash noted. Psychiatric: Mood and affect are normal. Speech and behavior are normal. Patient exhibits appropriate insight and judgement.   ____________________________________________   LABS (all labs ordered are listed, but only abnormal results are displayed)  Labs Reviewed - No data to display ____________________________________________  EKG   ____________________________________________  RADIOLOGY I personally viewed and evaluated these images as part of my medical decision making, as well as reviewing the written report by the radiologist.  I concur with radiologist finding of increased stool burden on abdomen films with no obstructive bowel gas pattern.  No acute findings on lumbar spine.  Dg Lumbar Spine Complete  Result Date: 09/19/2018 CLINICAL DATA:  Low back pain radiating to the left buttock. EXAM: LUMBAR SPINE - COMPLETE 4+ VIEW COMPARISON:  Lumbar spine MRI 02/15/2017 FINDINGS: Five non ribbed lumbar vertebrae. Gentle dextroconvex curvature of the lumbar spine which may be  positional. No acute fracture, pars defects or listhesis. No aggressive osseous lesions. Slight disc space narrowing L1-2 and L2-3. Mild facet arthrosis L3 through S1. S1-S2 disc. IMPRESSION: Mild disc space narrowing L1-2 and L2-3. No acute nor suspicious osseous abnormality. Mild facet arthrosis L3 through S1. Electronically Signed   By: Ashley Royalty M.D.   On: 09/19/2018 19:56   Dg Abdomen 1 View  Result Date: 09/19/2018 CLINICAL DATA:  Left lower back pain. EXAM: ABDOMEN - 1 VIEW COMPARISON:  05/03/2018 FINDINGS: The bowel gas pattern is normal. There is a moderate stool burden identified throughout the colon. No radio-opaque calculi or other significant radiographic abnormality are seen. IMPRESSION: 1. Nonobstructive bowel gas pattern. 2. Moderate stool  burden throughout the colon. Correlate for any clinical signs or symptoms of constipation. Electronically Signed   By: Kerby Moors M.D.   On: 09/19/2018 19:47    ____________________________________________    PROCEDURES  Procedure(s) performed:    Procedures    Medications  docusate (COLACE) 50 MG/5ML liquid 50 mg (50 mg Oral Given 09/19/18 2045)  magnesium citrate solution 1 Bottle (1 Bottle Oral Given 09/19/18 2045)     ____________________________________________   INITIAL IMPRESSION / ASSESSMENT AND PLAN / ED COURSE  Pertinent labs & imaging results that were available during my care of the patient were reviewed by me and considered in my medical decision making (see chart for details).  Review of the [place] CSRS was performed in accordance of the [place] prior to dispensing any controlled drugs.      Patient's diagnosis is consistent with constipation, chronic low back pain with left-sided sciatica.  Patient presents the emergency department complaining of being constipated as well as an increase in her chronic lower back pain.  Differential included UTI, pyelonephritis, nephrolithiasis, diverticulitis, abdominal  perforation, mesenteric ischemia.  Patient had no urinary symptoms.  No concern for infectious abdominal process with no fevers or chills, no abdominal pain.  X-ray reveals moderate stool burden throughout the colon.  Patient reports that she has been trying multiple remedies at home for constipation without success.  She requests an enema.  This is provided in the emergency department.  Patient will also be prescribed mag citrate, Senokot at home for continued constipation only.  Patient will be prescribed meloxicam and Robaxin for chronic back pain with sciatica.  No indication for further work-up at this time.  Follow-up with primary care and neurosurgery as needed..  Patient is given ED precautions to return to the ED for any worsening or new symptoms.     ____________________________________________  FINAL CLINICAL IMPRESSION(S) / ED DIAGNOSES  Final diagnoses:  Constipation, unspecified constipation type  Chronic midline low back pain with left-sided sciatica      NEW MEDICATIONS STARTED DURING THIS VISIT:  ED Discharge Orders         Ordered    magnesium citrate SOLN   Once    Note to Pharmacy:  10 bottles   09/19/18 2133    senna-docusate (SENOKOT-S) 8.6-50 MG tablet  Daily     09/19/18 2133    meloxicam (MOBIC) 15 MG tablet  Daily     09/19/18 2133    methocarbamol (ROBAXIN) 500 MG tablet  4 times daily     09/19/18 2133              This chart was dictated using voice recognition software/Dragon. Despite best efforts to proofread, errors can occur which can change the meaning. Any change was purely unintentional.    Darletta Moll, PA-C 09/19/18 2151    Merlyn Lot, MD 09/19/18 2159

## 2018-09-19 NOTE — ED Triage Notes (Signed)
L lower back pain radiating to L buttock x 3 days. Denies fall or injury.

## 2018-11-18 ENCOUNTER — Emergency Department (HOSPITAL_COMMUNITY)
Admission: EM | Admit: 2018-11-18 | Discharge: 2018-11-18 | Disposition: A | Discharge status: Home / Self Care | Payer: BC Managed Care – PPO | Attending: Emergency Medicine | Admitting: Emergency Medicine

## 2018-11-18 ENCOUNTER — Emergency Department (HOSPITAL_COMMUNITY): Payer: BC Managed Care – PPO

## 2018-11-18 DIAGNOSIS — I110 Hypertensive heart disease with heart failure: Secondary | ICD-10-CM | POA: Insufficient documentation

## 2018-11-18 DIAGNOSIS — R55 Syncope and collapse: Secondary | ICD-10-CM | POA: Insufficient documentation

## 2018-11-18 DIAGNOSIS — J45909 Unspecified asthma, uncomplicated: Secondary | ICD-10-CM | POA: Diagnosis not present

## 2018-11-18 DIAGNOSIS — I5030 Unspecified diastolic (congestive) heart failure: Secondary | ICD-10-CM | POA: Diagnosis not present

## 2018-11-18 DIAGNOSIS — R404 Transient alteration of awareness: Secondary | ICD-10-CM | POA: Insufficient documentation

## 2018-11-18 DIAGNOSIS — H539 Unspecified visual disturbance: Secondary | ICD-10-CM | POA: Insufficient documentation

## 2018-11-18 DIAGNOSIS — R51 Headache: Secondary | ICD-10-CM | POA: Insufficient documentation

## 2018-11-18 DIAGNOSIS — R42 Dizziness and giddiness: Secondary | ICD-10-CM | POA: Diagnosis not present

## 2018-11-18 DIAGNOSIS — Z7982 Long term (current) use of aspirin: Secondary | ICD-10-CM | POA: Diagnosis not present

## 2018-11-18 DIAGNOSIS — I10 Essential (primary) hypertension: Secondary | ICD-10-CM

## 2018-11-18 DIAGNOSIS — Z79899 Other long term (current) drug therapy: Secondary | ICD-10-CM | POA: Diagnosis not present

## 2018-11-18 LAB — CBC
HCT: 39.9 % (ref 36.0–46.0)
Hemoglobin: 13.2 g/dL (ref 12.0–15.0)
MCH: 30.7 pg (ref 26.0–34.0)
MCHC: 33.1 g/dL (ref 30.0–36.0)
MCV: 92.8 fL (ref 80.0–100.0)
NRBC: 0 % (ref 0.0–0.2)
PLATELETS: 176 10*3/uL (ref 150–400)
RBC: 4.3 MIL/uL (ref 3.87–5.11)
RDW: 12.2 % (ref 11.5–15.5)
WBC: 6.2 10*3/uL (ref 4.0–10.5)

## 2018-11-18 LAB — I-STAT CHEM 8, ED
BUN: 12 mg/dL (ref 6–20)
CHLORIDE: 101 mmol/L (ref 98–111)
Calcium, Ion: 1.19 mmol/L (ref 1.15–1.40)
Creatinine, Ser: 0.7 mg/dL (ref 0.44–1.00)
GLUCOSE: 82 mg/dL (ref 70–99)
HCT: 37 % (ref 36.0–46.0)
Hemoglobin: 12.6 g/dL (ref 12.0–15.0)
Potassium: 3.5 mmol/L (ref 3.5–5.1)
SODIUM: 139 mmol/L (ref 135–145)
TCO2: 30 mmol/L (ref 22–32)

## 2018-11-18 LAB — APTT: aPTT: 32 seconds (ref 24–36)

## 2018-11-18 LAB — URINALYSIS, ROUTINE W REFLEX MICROSCOPIC
Bilirubin Urine: NEGATIVE
GLUCOSE, UA: NEGATIVE mg/dL
Hgb urine dipstick: NEGATIVE
KETONES UR: NEGATIVE mg/dL
LEUKOCYTES UA: NEGATIVE
Nitrite: NEGATIVE
PH: 7 (ref 5.0–8.0)
Protein, ur: NEGATIVE mg/dL
SPECIFIC GRAVITY, URINE: 1.003 — AB (ref 1.005–1.030)

## 2018-11-18 LAB — COMPREHENSIVE METABOLIC PANEL
ALBUMIN: 4 g/dL (ref 3.5–5.0)
ALT: 15 U/L (ref 0–44)
ANION GAP: 11 (ref 5–15)
AST: 23 U/L (ref 15–41)
Alkaline Phosphatase: 62 U/L (ref 38–126)
BILIRUBIN TOTAL: 0.4 mg/dL (ref 0.3–1.2)
BUN: 11 mg/dL (ref 6–20)
CO2: 26 mmol/L (ref 22–32)
Calcium: 9.3 mg/dL (ref 8.9–10.3)
Chloride: 103 mmol/L (ref 98–111)
Creatinine, Ser: 0.77 mg/dL (ref 0.44–1.00)
GFR calc Af Amer: >60 mL/min (ref >60)
GFR calc non Af Amer: >60 mL/min (ref >60)
GLUCOSE: 85 mg/dL (ref 70–99)
Potassium: 3.6 mmol/L (ref 3.5–5.1)
SODIUM: 140 mmol/L (ref 135–145)
TOTAL PROTEIN: 7 g/dL (ref 6.5–8.1)

## 2018-11-18 LAB — DIFFERENTIAL
Abs Immature Granulocytes: 0.02 10*3/uL (ref 0.00–0.07)
BASOS ABS: 0 10*3/uL (ref 0.0–0.1)
Basophils Relative: 1 %
EOS PCT: 5 %
Eosinophils Absolute: 0.3 10*3/uL (ref 0.0–0.5)
IMMATURE GRANULOCYTES: 0 %
Lymphocytes Relative: 34 %
Lymphs Abs: 2.1 10*3/uL (ref 0.7–4.0)
Monocytes Absolute: 0.4 10*3/uL (ref 0.1–1.0)
Monocytes Relative: 7 %
Neutro Abs: 3.3 10*3/uL (ref 1.7–7.7)
Neutrophils Relative %: 53 %

## 2018-11-18 LAB — RAPID URINE DRUG SCREEN, HOSP PERFORMED
Amphetamines: NOT DETECTED
Barbiturates: NOT DETECTED
Benzodiazepines: NOT DETECTED
COCAINE: NOT DETECTED
OPIATES: NOT DETECTED
TETRAHYDROCANNABINOL: NOT DETECTED

## 2018-11-18 LAB — PROTIME-INR
INR: 0.85
PROTHROMBIN TIME: 11.5 s (ref 11.4–15.2)

## 2018-11-18 LAB — I-STAT TROPONIN, ED: Troponin i, poc: 0 ng/mL (ref 0.00–0.08)

## 2018-11-18 LAB — I-STAT BETA HCG BLOOD, ED (MC, WL, AP ONLY): I-stat hCG, quantitative: <5 m[IU]/mL (ref <5)

## 2018-11-18 MED ORDER — ACETAMINOPHEN 500 MG PO TABS
1000.0000 mg | ORAL_TABLET | Freq: Once | ORAL | Status: AC
  Administered 2018-11-18: 1000 mg via ORAL
  Filled 2018-11-18: qty 2

## 2018-11-18 MED ORDER — LORAZEPAM 2 MG/ML IJ SOLN
0.5000 mg | Freq: Once | INTRAMUSCULAR | Status: AC
  Administered 2018-11-18: 0.5 mg via INTRAVENOUS
  Filled 2018-11-18: qty 1

## 2018-11-18 MED ORDER — SODIUM CHLORIDE 0.9 % IV BOLUS
1000.0000 mL | Freq: Once | INTRAVENOUS | Status: AC
  Administered 2018-11-18: 1000 mL via INTRAVENOUS

## 2018-11-18 MED ORDER — METOCLOPRAMIDE HCL 5 MG/ML IJ SOLN
10.0000 mg | Freq: Once | INTRAMUSCULAR | Status: AC
  Administered 2018-11-18: 10 mg via INTRAVENOUS
  Filled 2018-11-18: qty 2

## 2018-11-18 MED ORDER — DIPHENHYDRAMINE HCL 50 MG/ML IJ SOLN
25.0000 mg | Freq: Once | INTRAMUSCULAR | Status: AC
  Administered 2018-11-18: 12.5 mg via INTRAVENOUS
  Filled 2018-11-18: qty 1

## 2018-11-18 MED ORDER — GADOBUTROL 1 MMOL/ML IV SOLN
9.0000 mL | Freq: Once | INTRAVENOUS | Status: AC | PRN
  Administered 2018-11-18: 9 mL via INTRAVENOUS

## 2018-11-18 NOTE — ED Notes (Signed)
Gold top sent to lab ?

## 2018-11-18 NOTE — ED Provider Notes (Signed)
[facility] EMERGENCY DEPARTMENT Provider Note   CSN: [id number] Arrival date & time: 11/18/18  1306   An emergency department physician performed an initial assessment on this suspected stroke patient at 1307(miller).  History   Chief Complaint Chief Complaint  Patient presents with  . Altered Mental Status    HPI [name] is a 58 y.o. female.  HPI  Patient is a 58 year old female with a history of hypertension, reported TIA, anxiety, and seizure-like activity as a child presenting for altered mental status.  She presents with her coworkers who initially observe the event.  According to coworkers, patient was standing by the coffee machine, and appeared to be "getting ready to fall".  They report that she was making "nonpurposeful" hand movements, closing eyes, not responding to them.  She was lowered to a chair.  They noted that they took her blood pressure with an automatic cuff in the office which was 177/117.  Patient became more cognizant and asked them to recheck it, was also very adamant that she did not want transfer to the emergency department.  Patient provides further history and reports that for the past months to years she has been experiencing nonspecific "dizziness" as well as vision disturbance.  She describes the "dizziness" as a disoriented feeling when ambulating.  She describes the vision disturbance as a sensation when she is coming out of anesthesia.  She reports that she is also having this severe associated headaches where they are mostly left-sided radiating down the back of her neck.  She reports she had a severe headache earlier in the week that required her to stay out of work.  Patient reports that she has had work-up by multiple specialist for her symptoms.  She reports that she thought she has been seen by neurology, but it may have been ENT.  Patient reports last EEG was at the age of 52 for "dropping out spells".  Patient reports that  the only medication she is taking right now is her antihypertensive.  Patient works as a Animal nutritionist.  She reports 1 of these episodes is never happened at school.  Further history was obtained from patient's daughter who states that the patient will have multiple "blackout spells" frequently, and describes them as the patient "not knowing who we are", and lasting approximately an hour.  Past Medical History:  Diagnosis Date  . Anxiety   . Asthma   . Atypical chest pain    in the setting of anxiety  . Back pain   . Chronic pain   . Diastolic heart failure ([place])   . Edema   . Fibromyalgia   . Hypertension   . IBS (irritable bowel syndrome)   . Lupus ([place])   . MVP (mitral valve prolapse) 12/22/2011   not confirmed on Echo 08/2015  . Pain in limb   . Positive ANA (antinuclear antibody) 01/29/2015  . S/P dilatation of esophageal stricture 12/22/2011  . Stroke ([place])   . Syncopal episodes   . TIA (transient ischemic attack)     Patient Active Problem List   Diagnosis Date Noted  . Lumbar degenerative disc disease 02/01/2017  . Diastolic heart failure ([place]) 11/11/2016  . Cervical myofascial pain syndrome 01/08/2016  . CTS (carpal tunnel syndrome) 12/05/2015  . Radiculopathy of lumbosacral region 12/05/2015  . Morning headache 08/29/2015  . Daytime somnolence 08/29/2015  . Obesity 08/29/2015  . Snoring 08/29/2015  . Paresthesias 08/29/2015  . Convulsions/seizures ([place]) 08/29/2015  . Altered  awareness, transient   . Cervical paraspinal muscle spasm 08/03/2015  . Other specified transient cerebral ischemias   . Essential hypertension   . Noncompliance with medication regimen   . TIA (transient ischemic attack) 08/02/2015  . Leg swelling 01/29/2015  . Positive ANA (antinuclear antibody) 01/29/2015  . Dizzy spells 01/01/2015  . Shortness of breath 11/20/2014  . DDD (degenerative disc disease), cervical 11/09/2013  . Generalized anxiety disorder 12/27/2012  . Menopause  syndrome 11/27/2012  . Allergic rhinitis 11/27/2012  . Hyperlipidemia 12/22/2011  . MVP (mitral valve prolapse) 12/22/2011  . S/P dilatation of esophageal stricture 12/22/2011  . Atypical chest pain 08/19/2011    Past Surgical History:  Procedure Laterality Date  . ABDOMINAL HYSTERECTOMY    . CARDIAC CATHETERIZATION    . CARDIAC CATHETERIZATION  07/2014   normal coronary arteries [facility])  . SPINE SURGERY    . TONSILLECTOMY       OB History   No obstetric history on file.      Home Medications    Prior to Admission medications   Medication Sig Start Date End Date Taking? Authorizing Provider  albuterol (PROVENTIL HFA;VENTOLIN HFA) 108 (90 Base) MCG/ACT inhaler Inhale 2 puffs into the lungs every 6 (six) hours as needed for wheezing or shortness of breath. 05/03/18   Merlyn Lot, MD  aspirin 81 MG chewable tablet Chew 81 mg by mouth daily.    [provider]  chlorthalidone (HYGROTON) 25 MG tablet Take 25 mg by mouth daily.    [provider]  ciprofloxacin-dexamethasone (CIPRODEX) OTIC suspension Place 4 drops 2 (two) times daily into both ears. 09/14/17   Varney Biles, MD  cyclobenzaprine (FLEXERIL) 10 MG tablet Take 1 tablet (10 mg total) by mouth at bedtime. Patient not taking: Reported on 09/14/2017 05/12/17   Norval Gable, MD  diazepam (VALIUM) 5 MG tablet Take 1 tab PO 1 hour before procedure or imaging. Patient not taking: Reported on 09/14/2017 03/11/17   Silverio Decamp, MD  enalapril (VASOTEC) 20 MG tablet Take 2 tablets (40 mg total) by mouth daily. Patient taking differently: Take 20 mg daily by mouth.  01/08/17   Hairston, Maylon Peppers, FNP  LORazepam (ATIVAN) 0.5 MG tablet Take 1 tablet (0.5 mg total) by mouth every 8 (eight) hours as needed for anxiety. 05/03/18 05/03/19  Merlyn Lot, MD  meclizine (ANTIVERT) 12.5 MG tablet Take 1 tablet (12.5 mg total) 3 (three) times daily as needed by mouth for dizziness. 09/14/17   Varney Biles, MD  meloxicam (MOBIC) 15 MG tablet Take 1 tablet (15 mg total) by mouth daily. 09/19/18   Cuthriell, Charline Bills, PA-C  methocarbamol (ROBAXIN) 500 MG tablet Take 1 tablet (500 mg total) by mouth 4 (four) times daily. 09/19/18   Cuthriell, Charline Bills, PA-C  senna-docusate (SENOKOT-S) 8.6-50 MG tablet Take 1 tablet by mouth daily. 09/19/18   Cuthriell, Charline Bills, PA-C    Family History Family History  Problem Relation Age of Onset  . Diabetes Mother   . Hypertension Mother   . Heart disease Mother   . Arthritis/Rheumatoid Mother   . Diabetes Father   . Hypertension Father   . COPD Father   . Heart attack Father 71  . Emphysema Father   . Stroke Other   . Coronary artery disease Other     Social History Social History   Tobacco Use  . Smoking status: Never Smoker  . Smokeless tobacco: Never Used  Substance Use Topics  . Alcohol use: No  Alcohol/week: 0.0 standard drinks  . Drug use: No     Allergies   Hydroxychloroquine; Gabapentin; Hydrochlorothiazide; Metoprolol; Morphine and related; Other; Pantoprazole sodium; and Prednisone   Review of Systems Review of Systems  Constitutional: Negative for chills and fever.  HENT: Negative for congestion and sore throat.   Eyes: Positive for visual disturbance.  Respiratory: Negative for cough, chest tightness and shortness of breath.   Cardiovascular: Negative for chest pain, palpitations and leg swelling.  Gastrointestinal: Positive for nausea. Negative for abdominal pain and vomiting.  Genitourinary: Negative for dysuria and flank pain.  Musculoskeletal: Negative for back pain and myalgias.  Skin: Negative for rash.  Neurological: Positive for light-headedness and headaches. Negative for dizziness and syncope.     Physical Exam Updated Vital Signs BP (!) 166/94   Pulse (!) 56   Temp 97.6 F (36.4 C) (Oral)   Resp 16   SpO2 100%   Physical Exam Vitals signs and nursing note reviewed.  Constitutional:       General: She is not in acute distress.    Appearance: She is well-developed.  HENT:     Head: Normocephalic and atraumatic.  Eyes:     Conjunctiva/sclera: Conjunctivae normal.     Pupils: Pupils are equal, round, and reactive to light.  Neck:     Musculoskeletal: Normal range of motion and neck supple.  Cardiovascular:     Rate and Rhythm: Normal rate and regular rhythm.     Heart sounds: S1 normal and S2 normal. No murmur.  Pulmonary:     Effort: Pulmonary effort is normal.     Breath sounds: Normal breath sounds. No wheezing or rales.  Abdominal:     General: There is no distension.     Palpations: Abdomen is soft.     Tenderness: There is no abdominal tenderness. There is no guarding.  Musculoskeletal: Normal range of motion.        General: No deformity.  Lymphadenopathy:     Cervical: No cervical adenopathy.  Skin:    General: Skin is warm and dry.     Findings: No erythema or rash.  Neurological:     Mental Status: She is alert.     Comments: Mental Status:  Alert, oriented, thought content appropriate, able to give a coherent history. Speech fluent without evidence of aphasia. Able to follow 2 step commands without difficulty.  Cranial Nerves:  II:  Peripheral visual fields grossly normal, pupils equal, round, reactive to light III,IV, VI: ptosis not present, extra-ocular motions intact bilaterally  V,VII: smile symmetric, facial light touch sensation equal VIII: hearing grossly normal to voice  X: uvula elevates symmetrically  XI: bilateral shoulder shrug symmetric and strong XII: midline tongue extension without fassiculations Motor:  Some repetitive movements such as lip smacking Normal tone. 5/5 in upper and lower extremities bilaterally including strong and equal grip strength and dorsiflexion/plantar flexion Sensory: Pinprick and light touch normal in all extremities.  Deep Tendon Reflexes: 2+ and symmetric in the biceps and patella. . Cerebellar: normal  finger-to-nose with bilateral upper extremities Normal and symmetric gait.   No pronator drift and good coordination, strength, and position sense with tapping of bilateral arms (performed in sitting position). CV: distal pulses palpable throughout    Psychiatric:     Comments: A&O x4.  Flattened affect.      ED Treatments / Results  Labs (all labs ordered are listed, but only abnormal results are displayed) Labs Reviewed  URINALYSIS, ROUTINE W REFLEX MICROSCOPIC -  Abnormal; Notable for the following components:      Result Value   Color, Urine COLORLESS (*)    Specific Gravity, Urine 1.003 (*)    All other components within normal limits  PROTIME-INR  APTT  CBC  DIFFERENTIAL  COMPREHENSIVE METABOLIC PANEL  RAPID URINE DRUG SCREEN, HOSP PERFORMED  I-STAT CHEM 8, ED  I-STAT TROPONIN, ED  I-STAT BETA HCG BLOOD, ED ([facility], [facility], [facility] ONLY)    EKG EKG Interpretation  Date/Time:  Friday November 18 2018 13:41:31 EST Ventricular Rate:  55 PR Interval:    QRS Duration: 108 QT Interval:  453 QTC Calculation: 434 R Axis:   -6 Text Interpretation:  Sinus rhythm RSR' in V1 or V2, probably normal variant Probable left ventricular hypertrophy since last tracing no significant change Confirmed by Malvin Johns [phone]) on 11/18/2018 5:19:31 PM   Radiology Mr Brain W And Wo Contrast  Result Date: 11/18/2018 CLINICAL DATA:  Altered mental status.  History of lupus. EXAM: MRI HEAD WITHOUT AND WITH CONTRAST TECHNIQUE: Multiplanar, multiecho pulse sequences of the brain and surrounding structures were obtained without and with intravenous contrast. CONTRAST:  9 mL Gadovist IV COMPARISON:  CT head 11/18/2018 FINDINGS: Brain: No acute infarction, hemorrhage, hydrocephalus, extra-axial collection or mass lesion. Normal enhancement postcontrast infusion. Vascular: Normal arterial flow voids Skull and upper cervical spine: Negative Sinuses/Orbits: Negative Other: None IMPRESSION: Negative MRI head with  contrast. Electronically Signed   By: Franchot Gallo M.D.   On: 11/18/2018 19:27   Ct Head Code Stroke Wo Contrast  Result Date: 11/18/2018 CLINICAL DATA:  Code stroke. Last seen normal 1130 hours. Confusion. Left-sided weakness. EXAM: CT HEAD WITHOUT CONTRAST TECHNIQUE: Contiguous axial images were obtained from the base of the skull through the vertex without intravenous contrast. COMPARISON:  09/14/2017 FINDINGS: Brain: Normal appearance without evidence of old or acute infarction, mass lesion, hemorrhage, hydrocephalus or extra-axial collection. Vascular: No abnormal vascular finding. Skull: Normal Sinuses/Orbits: Clear/normal Other: None ASPECTS ([place] Stroke Program Early CT Score) - Ganglionic level infarction (caudate, lentiform nuclei, internal capsule, insula, M1-M3 cortex): 7 - Supraganglionic infarction (M4-M6 cortex): 3 Total score (0-10 with 10 being normal): 10 IMPRESSION: 1. Normal head CT 2. ASPECTS is 10. 3. These results were called by telephone to Dr. Marcelle Overlie At 1:27 pmon 11/18/2018. Electronically Signed   By: Nelson Chimes M.D.   On: 11/18/2018 13:29    Procedures Procedures (including critical care time)  Medications Ordered in ED Medications  sodium chloride 0.9 % bolus 1,000 mL (0 mLs Intravenous Stopped 11/18/18 1701)  metoCLOPramide (REGLAN) injection 10 mg (10 mg Intravenous Given 11/18/18 1505)  diphenhydrAMINE (BENADRYL) injection 25 mg (12.5 mg Intravenous Given 11/18/18 1520)  acetaminophen (TYLENOL) tablet 1,000 mg (1,000 mg Oral Given 11/18/18 1504)  LORazepam (ATIVAN) injection 0.5 mg (0.5 mg Intravenous Given 11/18/18 1829)  gadobutrol (GADAVIST) 1 MMOL/ML injection 9 mL (9 mLs Intravenous Contrast Given 11/18/18 1921)     Initial Impression / Assessment and Plan / ED Course  I have reviewed the triage vital signs and the nursing notes.  Pertinent labs & imaging results that were available during my care of the patient were reviewed by me and considered in my  medical decision making (see chart for details).  Clinical Course as of Nov 19 2027  Ludwig Clarks Nov 18, 2018  2028 Patient back to baseline per patient and daughter.  Patient ambulated without difficulty.  Patient reports she is very tired and she is unable to get good sleep at home.    [  AM]    Clinical Course User Index [AM] Albesa Seen, PA-C    On initial evaluation, patient appearing invasive with eyesight, and having repetitive motions of lipsmacking.  Soon after colleagues were asked to leave the room, patient more conversive.  Patient still has moments of confusion and mixing up words, although is rapidly improving to neurologic baseline.  Upon receiving collateral information from patient's daughter, these episodes of transient alteration in awareness are common, happening at least monthly.  Patient evaluated by Dr. Marcelle Overlie for code stroke.  Patient quickly determined not to be a code stroke and had negative CT scan for any intracranial abnormality.  Recommends additional labs and MRI and routine EEG if symptoms persist.  Patient is involved in the care of this patient.  Patient's work-up is unremarkable.  UDS is negative.  CBC and CMP without abnormality.  Patient reports that she recently had B12 levels and TSH drawn, would like to defer this to outpatient.  Dr. Marcelle Overlie also recommended folate level and ammonia. Feel this is reasonable for outpatient.  Patient's CT code stroke without contrast is without abnormality.  Patient's MRI with and without contrast without any evidence of focal neurologic finding.  Patient proving on multiple reassessments.  Patient stable for outpatient neurologic follow-up now that she is back to baseline per discussion with Dr. Marcelle Overlie.   Patient can return precautions for any sudden severe headaches, further alteration in mental status, weakness, numbness, difficulty speaking or walking.  Patient and daughter are in understanding and agrees with the plan of  care.  Final Clinical Impressions(s) / ED Diagnoses   Final diagnoses:  Altered awareness, transient  Elevated blood pressure reading with diagnosis of hypertension    ED Discharge Orders         Ordered    Ambulatory referral to Neurology    Comments:  An appointment is requested in approximately: 1-2 week   11/18/18 2032           Tamala Julian 11/18/18 2037    Malvin Johns, MD 11/18/18 [phone]

## 2018-11-18 NOTE — ED Notes (Signed)
I ARRIVED AT  THE CODE STROKE THERE WAS NO BLOOD DRAWN AT THAT TIME PER THE PROVIDER.

## 2018-11-18 NOTE — Code Documentation (Signed)
58yo female arriving to [facility] via [place] at 86. Patient from work where she began having unusual behavior following a meeting at [employer]. She appeared as she was going to pass out then began making abnormal movements with her hands and blinking per coworkers. She appeared to not recognize her coworkers and became combative. Coworkers report similar episode in the past associated with dizziness and headache. Of note, patient reporting dizziness and headache today. EMS was called and activated a code stroke for altered mental status and confusion. Stroke team at the bedside on patient arrival. Patient anxious. Dr. Sabra Heck assessed patient and cleared her for CT. No labs drawn at this time. Patient to CT with team. CT completed. NIHSS 2, see documentation for details and code stroke times. No focal deficits on exam. Patient would not lift bilateral lower extremities up for 5 seconds, however, patient required multiple attempts to attend to exam. No acute stroke treatment at this time. Code stroke canceled. Bedside handoff with ED RN Legrand Como.

## 2018-11-18 NOTE — ED Triage Notes (Addendum)
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 Per staff, pt was acting unusual and not at her baseline, EMS initially called code stroke due to left sided facial droop. Symptoms resolved PTA to ED. PT presented to ED w/ no focal deficits or weakness. but pt was initially very fearful of staff but will follow directions. EMS v/s 170/102, hr88, rr16, 99%ra. Pt c/o headache that has persisted for days prior.

## 2018-11-18 NOTE — Discharge Instructions (Signed)
Please see the information and instructions below regarding your visit.  Your diagnoses today include:  1. Altered awareness, transient   2. Elevated blood pressure reading with diagnosis of hypertension     Tests performed today include: See side panel of your discharge paperwork for testing performed today. Vital signs are listed at the bottom of these instructions.   Your lab work, CT and MRI are normal today.  The next up would likely be an EEG.  Medications prescribed:    Take any prescribed medications only as prescribed, and any over the counter medications only as directed on the packaging.  Resume home blood pressure medication.  Home care instructions:  Please follow any educational materials contained in this packet.   Follow-up instructions: Please follow-up with your primary care provider in 2 weeks for further evaluation of your blood pressure.  Please follow up with [facility] as soon as possible.   Return instructions:  Please return to the Emergency Department if you experience worsening symptoms.  Please return the emergency department immediately if you develop any further changes in mental status, confusion, difficulty walking, difficulty speaking, weakness or numbness in any of your extremities, fevers with headache, or worsening headaches. Please return if you have any other emergent concerns.  Additional Information:   Your vital signs today were: BP (!) 166/94    Pulse (!) 56    Temp 97.6 F (36.4 C) (Oral)    Resp 16    SpO2 100%  If your blood pressure (BP) was elevated on multiple readings during this visit above 130 for the top number or above 80 for the bottom number, please have this repeated by your primary care provider within one month. --------------  Thank you for allowing [place] to participate in your care today.

## 2018-11-18 NOTE — Consult Note (Signed)
Neurology Consultation  Reason for Consult: Code Stroke Referring Physician: Deno Etienne, DO  CC: AMS  History is obtained from: EMS and School Principal  HPI: [name] is a 58 y.o. 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  Per school principal, pt called out on Monday 1/13 stating she's been having terrible headaches which started the day prior.  Pt was in a meeting until 1130 today.  However, at around 1300, she was noted to make some bizarre noise and appeared as if she was going to pass out.  She appeared to have strange movements of her hands, blinking a lot, became agitated and combative.  Pt also appeared afraid of her co-workers.  EMS was called and stroke code was activated for possible L facial droop. Pt was seen and examined in ED. NIHSS 2. Head CT was negative for acute intracranial pathology.  Pt was not a candidate for IV tPA as symptoms are unlikely due to stroke.  Of note, pt had a similar event during the last school year.  LKW: 11 tpa given?: no, minor symptoms Premorbid modified Rankin scale (mRS): 0 0-Completely asymptomatic and back to baseline post-stroke 1-No significant post stroke disability and can perform usual duties with stroke symptoms 2-Slight disability-UNABLE to perform all activities but does not need assistance  3-Moderate disability-requires help but walks WITHOUT assistance 4-Needs assistance to walk and tend to bodily needs 5-Severe disability-bedridden, incontinent, needs constant attention 6- Death  ROS: Unable to obtain due to altered mental status.   Past Medical History:  Diagnosis Date  . Anxiety   . Asthma   . Atypical chest pain    in the setting of anxiety  . Back pain   . Chronic pain   . Diastolic heart failure ([place])   . Edema   . Fibromyalgia   . Hypertension   . IBS (irritable bowel syndrome)   . Lupus ([place])   . MVP (mitral valve  prolapse) 12/22/2011   not confirmed on Echo 08/2015  . Pain in limb   . Positive ANA (antinuclear antibody) 01/29/2015  . S/P dilatation of esophageal stricture 12/22/2011  . Stroke ([place])   . Syncopal episodes   . TIA (transient ischemic attack)    Family History  Problem Relation Age of Onset  . Diabetes Mother   . Hypertension Mother   . Heart disease Mother   . Arthritis/Rheumatoid Mother   . Diabetes Father   . Hypertension Father   . COPD Father   . Heart attack Father 30  . Emphysema Father   . Stroke Other   . Coronary artery disease Other      Social History:   reports that she has never smoked. She has never used smokeless tobacco. She reports that she does not drink alcohol or use drugs.  Medications  Current Facility-Administered Medications:  .  acetaminophen (TYLENOL) tablet 1,000 mg, 1,000 mg, Oral, Once, Murray, Alyssa B, PA-C .  diphenhydrAMINE (BENADRYL) injection 25 mg, 25 mg, Intravenous, Once, Murray, Alyssa B, PA-C .  metoCLOPramide (REGLAN) injection 10 mg, 10 mg, Intravenous, Once, Murray, Alyssa B, PA-C .  sodium chloride 0.9 % bolus 1,000 mL, 1,000 mL, Intravenous, Once, Murray, Alyssa B, PA-C  Current Outpatient Medications:  .  albuterol (PROVENTIL HFA;VENTOLIN HFA) 108 (90 Base) MCG/ACT inhaler, Inhale 2 puffs into the lungs every 6 (six) hours as needed for wheezing or shortness of  breath., Disp: 1 Inhaler, Rfl: 2 .  aspirin 81 MG chewable tablet, Chew 81 mg by mouth daily., Disp: , Rfl:  .  chlorthalidone (HYGROTON) 25 MG tablet, Take 25 mg by mouth daily., Disp: , Rfl:  .  ciprofloxacin-dexamethasone (CIPRODEX) OTIC suspension, Place 4 drops 2 (two) times daily into both ears., Disp: 7.5 mL, Rfl: 0 .  cyclobenzaprine (FLEXERIL) 10 MG tablet, Take 1 tablet (10 mg total) by mouth at bedtime. (Patient not taking: Reported on 09/14/2017), Disp: 30 tablet, Rfl: 0 .  diazepam (VALIUM) 5 MG tablet, Take 1 tab PO 1 hour before procedure or imaging.  (Patient not taking: Reported on 09/14/2017), Disp: 2 tablet, Rfl: 0 .  enalapril (VASOTEC) 20 MG tablet, Take 2 tablets (40 mg total) by mouth daily. (Patient taking differently: Take 20 mg daily by mouth. ), Disp: 30 tablet, Rfl: 2 .  LORazepam (ATIVAN) 0.5 MG tablet, Take 1 tablet (0.5 mg total) by mouth every 8 (eight) hours as needed for anxiety., Disp: 6 tablet, Rfl: 0 .  meclizine (ANTIVERT) 12.5 MG tablet, Take 1 tablet (12.5 mg total) 3 (three) times daily as needed by mouth for dizziness., Disp: 30 tablet, Rfl: 0 .  meloxicam (MOBIC) 15 MG tablet, Take 1 tablet (15 mg total) by mouth daily., Disp: 30 tablet, Rfl: 0 .  methocarbamol (ROBAXIN) 500 MG tablet, Take 1 tablet (500 mg total) by mouth 4 (four) times daily., Disp: 16 tablet, Rfl: 0 .  senna-docusate (SENOKOT-S) 8.6-50 MG tablet, Take 1 tablet by mouth daily., Disp: 30 tablet, Rfl: 0  Exam: Current vital signs: BP (!) 166/94   Pulse (!) 56   Temp 97.6 F (36.4 C) (Oral)   Resp 16   SpO2 100%  Vital signs in last 24 hours: Temp:  [97.6 F (36.4 C)] 97.6 F (36.4 C) (01/17 1342) Pulse Rate:  [56] 56 (01/17 1342) Resp:  [16] 16 (01/17 1342) BP: (166)/(94) 166/94 (01/17 1342) SpO2:  [100 %] 100 % (01/17 1342)  Physical Exam  Constitutional: Appears well-developed and well-nourished.  Psych: Affect appropriate to situation Eyes: No scleral injection HENT: No OP obstrucion Head: Normocephalic.  Cardiovascular: Normal rate and regular rhythm.  Respiratory: Effort normal, non-labored breathing GI: Soft.  No distension. There is no tenderness.  Skin: WDI  Neuro: Mental Status: Patient is awake, alert, oriented to person, place, time. Inattentive. Strange affect. Apprehensive. Unable to spell WORLD backwards and unable to do serial 7s No signs of aphasia or neglect Cranial Nerves: II: Visual Fields are full. Pupils are equal, round, and reactive to light.   III,IV, VI: EOMI without ptosis or diploplia.  V: Facial  sensation is symmetric to temperature VII: Facial movement is symmetric.  VIII: hearing is intact to voice X: Uvula elevates symmetrically XI: Shoulder shrug is symmetric. XII: tongue is midline without atrophy or fasciculations.  Motor: Tone is normal. Bulk is normal. 5/5 strength was present in all four extremities.  Sensory: Sensation is symmetric to light touch and temperature in the arms and legs. Deep Tendon Reflexes: 2+ and symmetric in the biceps and patellae.  Plantars: Toes are downgoing bilaterally.  Cerebellar: FNF and HKS are intact bilaterally  NIHSS 2   Labs  CBC    Component Value Date/Time   WBC 6.3 05/03/2018 1810   RBC 4.03 05/03/2018 1810   HGB 12.6 11/18/2018 1421   HCT 37.0 11/18/2018 1421   PLT 191 05/03/2018 1810   MCV 90.9 05/03/2018 1810   MCH 31.5 05/03/2018 1810  MCHC 34.7 05/03/2018 1810   RDW 13.2 05/03/2018 1810   LYMPHSABS 2,006 11/11/2016 1045   MONOABS 354 11/11/2016 1045   EOSABS 177 11/11/2016 1045   BASOSABS 59 11/11/2016 1045    CMP     Component Value Date/Time   NA 139 11/18/2018 1421   K 3.5 11/18/2018 1421   CL 101 11/18/2018 1421   CO2 31 05/03/2018 1810   GLUCOSE 82 11/18/2018 1421   BUN 12 11/18/2018 1421   CREATININE 0.70 11/18/2018 1421   CREATININE 0.82 11/11/2016 1045   CALCIUM 9.0 05/03/2018 1810   PROT 7.2 07/28/2016 2220   ALBUMIN 4.2 07/28/2016 2220   AST 20 07/28/2016 2220   ALT 15 07/28/2016 2220   ALKPHOS 83 07/28/2016 2220   BILITOT 0.3 07/28/2016 2220   GFRNONAA >60 05/03/2018 1810   GFRAA >60 05/03/2018 1810    Lipid Panel     Component Value Date/Time   CHOL 201 (H) 08/03/2015 0648   TRIG 207 (H) 08/03/2015 0648   HDL 50 08/03/2015 0648   CHOLHDL 4.0 08/03/2015 0648   VLDL 41 (H) 08/03/2015 0648   LDLCALC 110 (H) 08/03/2015 0648     Imaging CT-scan of the brain: Negative for acute intracranial path  A/P: 58 yo F with hx Anxiety, Lupus, TIA, ?seizure as a child sent from school  where she works with acute onset of encephalopathy likely psychogenic in etiology  Neuro exam non-focal Head CT negative for acute intracranial pathology Tylenol for pain Check TSH, B12, folate, ammonia Consider MRI brain and Routine EEG if symptoms persists   Total time spent 58min  Ray Church, MD Attending Neurologist

## 2018-11-21 ENCOUNTER — Telehealth: Payer: Self-pay | Admitting: Neurology

## 2018-11-21 NOTE — Telephone Encounter (Signed)
Patient was seen in the Ed at [facility] on 11/18/18 and has been referred to our office for a urgent apt. Pt was l/s 2017 by Dr. Jaynee Eagles, pt wants to switch to Dr. Leta Baptist. Is this ok?

## 2018-11-21 NOTE — Telephone Encounter (Signed)
Dr. Jaynee Eagles offered appt this Friday at noon. pls have patient see dr. Jaynee Eagles first before switching. -VRP

## 2018-11-21 NOTE — Telephone Encounter (Signed)
Patient to be offered appointment Friday at noon

## 2018-11-22 NOTE — Telephone Encounter (Signed)
Dr. Leta Baptist I did offer that appointment to the patient and she has declined and stated that she wanted to switch providers

## 2018-11-24 ENCOUNTER — Telehealth: Payer: Self-pay | Admitting: Neurology

## 2018-11-24 NOTE — Telephone Encounter (Addendum)
I spoke with [name] our new referrals coordinator today. Patient was upset and agitated on the phone. She was inappropriate to betty. Also said that Dr. Jaynee Eagles called her "a baby" during emg/ncs which is categorically untrue. Patient has seen 2 neurologists in our practice already and is currently seeing [facility]. She declined an appointment with me, I offered to create an appointment so she could be seen asap.     At this point I don't think we can offer her anything more here at [facility] These symptoms have been ongoing for years and she has been evaluated extensively in the past by myself and multiple neurologists.  In fact she recently seen by Neurology. If patient is seeing Neurology [facility], she needs to follow up wit them. Also given her inappropriate behavior with our staff and her blatent untrue statement above I would like to dismiss patient from our practice I feel she would have better neurologic care elsewhere and this is the best for patient.  thank you

## 2018-11-24 NOTE — Telephone Encounter (Signed)
error 

## 2018-11-25 ENCOUNTER — Encounter: Payer: Self-pay | Admitting: Neurology

## 2018-11-25 NOTE — Telephone Encounter (Signed)
dismissed

## 2019-09-01 ENCOUNTER — Other Ambulatory Visit: Payer: Self-pay

## 2019-09-01 DIAGNOSIS — Z20822 Contact with and (suspected) exposure to covid-19: Secondary | ICD-10-CM

## 2019-09-02 LAB — NOVEL CORONAVIRUS, NAA: SARS-CoV-2, NAA: NOT DETECTED

## 2019-11-29 ENCOUNTER — Ambulatory Visit: Payer: BC Managed Care – PPO | Attending: Internal Medicine

## 2019-11-29 DIAGNOSIS — Z20822 Contact with and (suspected) exposure to covid-19: Secondary | ICD-10-CM

## 2019-12-01 LAB — NOVEL CORONAVIRUS, NAA: SARS-CoV-2, NAA: NOT DETECTED

## 2019-12-30 ENCOUNTER — Ambulatory Visit: Payer: BC Managed Care – PPO | Attending: Internal Medicine

## 2019-12-30 DIAGNOSIS — Z23 Encounter for immunization: Secondary | ICD-10-CM | POA: Insufficient documentation

## 2019-12-30 NOTE — Progress Notes (Signed)
   Covid-19 Vaccination Clinic  Name:  [name]    MRN: [id number] DOB: 09-21-1961  12/30/2019  [name] was observed post Covid-19 immunization for 15 minutes without incidence. She was provided with Vaccine Information Sheet and instruction to access the V-Safe system.   [name] was instructed to call 911 with any severe reactions post vaccine: Marland Kitchen Difficulty breathing  . Swelling of your face and throat  . A fast heartbeat  . A bad rash all over your body  . Dizziness and weakness    Immunizations Administered    Name Date Dose VIS Date Route   Pfizer COVID-19 Vaccine 12/30/2019  5:08 PM 0.3 mL 10/13/2019 Intramuscular   Manufacturer: [place]   Lot: [id number]   [place]: [id number]

## 2020-01-20 ENCOUNTER — Ambulatory Visit: Payer: BC Managed Care – PPO | Attending: Internal Medicine

## 2020-01-20 DIAGNOSIS — Z23 Encounter for immunization: Secondary | ICD-10-CM

## 2020-01-20 NOTE — Progress Notes (Signed)
   Covid-19 Vaccination Clinic  Name:  [name]    MRN: [id number] DOB: 1960-12-05  01/20/2020  [name] was observed post Covid-19 immunization for 15 minutes without incident. She was provided with Vaccine Information Sheet and instruction to access the V-Safe system.   [name] was instructed to call 911 with any severe reactions post vaccine: Marland Kitchen Difficulty breathing  . Swelling of face and throat  . A fast heartbeat  . A bad rash all over body  . Dizziness and weakness   Immunizations Administered    Name Date Dose VIS Date Route   Pfizer COVID-19 Vaccine 01/20/2020  9:06 AM 0.3 mL 10/13/2019 Intramuscular   Manufacturer: [place]   Lot: [id number]   [place]: [id number]

## 2020-01-22 ENCOUNTER — Encounter: Payer: Self-pay | Admitting: Certified Nurse Midwife

## 2020-03-08 ENCOUNTER — Ambulatory Visit
Admission: RE | Admit: 2020-03-08 | Discharge: 2020-03-08 | Disposition: A | Discharge status: Home / Self Care | Payer: No Typology Code available for payment source | Attending: Family Medicine | Admitting: Family Medicine

## 2020-03-08 ENCOUNTER — Encounter: Payer: Self-pay | Admitting: Emergency Medicine

## 2020-03-08 ENCOUNTER — Other Ambulatory Visit: Payer: Self-pay

## 2020-03-08 ENCOUNTER — Other Ambulatory Visit: Payer: Self-pay | Admitting: Family Medicine

## 2020-03-08 ENCOUNTER — Emergency Department
Admission: EM | Admit: 2020-03-08 | Discharge: 2020-03-08 | Disposition: A | Discharge status: Home / Self Care | Payer: No Typology Code available for payment source | Attending: Emergency Medicine | Admitting: Emergency Medicine

## 2020-03-08 ENCOUNTER — Ambulatory Visit
Admission: RE | Admit: 2020-03-08 | Discharge: 2020-03-08 | Disposition: A | Discharge status: Home / Self Care | Payer: No Typology Code available for payment source | Source: Ambulatory Visit | Attending: Family Medicine | Admitting: Family Medicine

## 2020-03-08 DIAGNOSIS — M25561 Pain in right knee: Secondary | ICD-10-CM | POA: Diagnosis not present

## 2020-03-08 DIAGNOSIS — Z5321 Procedure and treatment not carried out due to patient leaving prior to being seen by health care provider: Secondary | ICD-10-CM | POA: Diagnosis not present

## 2020-03-08 DIAGNOSIS — W19XXXA Unspecified fall, initial encounter: Secondary | ICD-10-CM

## 2020-03-08 NOTE — ED Notes (Signed)
Pt was informed by her boss  [facility]

## 2020-03-08 NOTE — ED Triage Notes (Signed)
Pt tripped over student yesterday at school and fell on right leg.  C/o knee pain mostly. Severe pain when flexing.  Also has mild elbow pain in right.  Works for [employer], English as a second language teacher.

## 2020-03-08 NOTE — ED Notes (Signed)
See triage note  States she fell yesterday while at school  States she tried to step over a child that was under her feet    [address] landed on right side  Having pain to right knee and elbow

## 2020-11-06 ENCOUNTER — Other Ambulatory Visit: Payer: Self-pay

## 2020-11-06 DIAGNOSIS — Z20822 Contact with and (suspected) exposure to covid-19: Secondary | ICD-10-CM

## 2020-11-08 LAB — SARS-COV-2, NAA 2 DAY TAT

## 2020-11-08 LAB — NOVEL CORONAVIRUS, NAA: SARS-CoV-2, NAA: DETECTED — AB

## 2021-06-27 ENCOUNTER — Emergency Department: Payer: BC Managed Care – PPO

## 2021-06-27 ENCOUNTER — Emergency Department
Admission: EM | Admit: 2021-06-27 | Discharge: 2021-06-28 | Disposition: A | Discharge status: Home / Self Care | Payer: BC Managed Care – PPO | Attending: Emergency Medicine | Admitting: Emergency Medicine

## 2021-06-27 ENCOUNTER — Other Ambulatory Visit: Payer: Self-pay

## 2021-06-27 DIAGNOSIS — I110 Hypertensive heart disease with heart failure: Secondary | ICD-10-CM | POA: Insufficient documentation

## 2021-06-27 DIAGNOSIS — Z20822 Contact with and (suspected) exposure to covid-19: Secondary | ICD-10-CM | POA: Diagnosis not present

## 2021-06-27 DIAGNOSIS — Z7982 Long term (current) use of aspirin: Secondary | ICD-10-CM | POA: Diagnosis not present

## 2021-06-27 DIAGNOSIS — J45909 Unspecified asthma, uncomplicated: Secondary | ICD-10-CM | POA: Insufficient documentation

## 2021-06-27 DIAGNOSIS — R42 Dizziness and giddiness: Secondary | ICD-10-CM | POA: Diagnosis not present

## 2021-06-27 DIAGNOSIS — Z79899 Other long term (current) drug therapy: Secondary | ICD-10-CM | POA: Insufficient documentation

## 2021-06-27 DIAGNOSIS — I5030 Unspecified diastolic (congestive) heart failure: Secondary | ICD-10-CM | POA: Insufficient documentation

## 2021-06-27 LAB — CBC
HCT: 36.8 % (ref 36.0–46.0)
Hemoglobin: 12.5 g/dL (ref 12.0–15.0)
MCH: 31.3 pg (ref 26.0–34.0)
MCHC: 34 g/dL (ref 30.0–36.0)
MCV: 92.2 fL (ref 80.0–100.0)
Platelets: 176 10*3/uL (ref 150–400)
RBC: 3.99 MIL/uL (ref 3.87–5.11)
RDW: 12.5 % (ref 11.5–15.5)
WBC: 6.6 10*3/uL (ref 4.0–10.5)
nRBC: 0 % (ref 0.0–0.2)

## 2021-06-27 LAB — URINALYSIS, COMPLETE (UACMP) WITH MICROSCOPIC
Bacteria, UA: NONE SEEN
Bilirubin Urine: NEGATIVE
Glucose, UA: NEGATIVE mg/dL
Hgb urine dipstick: NEGATIVE
Ketones, ur: NEGATIVE mg/dL
Leukocytes,Ua: NEGATIVE
Nitrite: NEGATIVE
Protein, ur: NEGATIVE mg/dL
Specific Gravity, Urine: 1.023 (ref 1.005–1.030)
Squamous Epithelial / HPF: NONE SEEN (ref 0–5)
pH: 5 (ref 5.0–8.0)

## 2021-06-27 LAB — COMPREHENSIVE METABOLIC PANEL
ALT: 16 U/L (ref 0–44)
AST: 24 U/L (ref 15–41)
Albumin: 4.1 g/dL (ref 3.5–5.0)
Alkaline Phosphatase: 66 U/L (ref 38–126)
Anion gap: 7 (ref 5–15)
BUN: 18 mg/dL (ref 6–20)
CO2: 30 mmol/L (ref 22–32)
Calcium: 9.4 mg/dL (ref 8.9–10.3)
Chloride: 100 mmol/L (ref 98–111)
Creatinine, Ser: 0.77 mg/dL (ref 0.44–1.00)
GFR, Estimated: >60 mL/min (ref >60)
Glucose, Bld: 93 mg/dL (ref 70–99)
Potassium: 3.8 mmol/L (ref 3.5–5.1)
Sodium: 137 mmol/L (ref 135–145)
Total Bilirubin: 0.5 mg/dL (ref 0.3–1.2)
Total Protein: 7 g/dL (ref 6.5–8.1)

## 2021-06-27 LAB — RESP PANEL BY RT-PCR (FLU A&B, COVID) ARPGX2
Influenza A by PCR: NEGATIVE
Influenza B by PCR: NEGATIVE
SARS Coronavirus 2 by RT PCR: NEGATIVE

## 2021-06-27 LAB — TROPONIN I (HIGH SENSITIVITY)
Troponin I (High Sensitivity): 3 ng/L (ref <18)
Troponin I (High Sensitivity): 3 ng/L (ref <18)

## 2021-06-27 MED ORDER — LORAZEPAM 0.5 MG PO TABS
0.5000 mg | ORAL_TABLET | Freq: Once | ORAL | Status: AC
  Administered 2021-06-27: 0.5 mg via ORAL
  Filled 2021-06-27: qty 1

## 2021-06-27 NOTE — ED Notes (Signed)
Pt able to ,abulate to bathroom & void sans complications.

## 2021-06-27 NOTE — ED Provider Notes (Signed)
[facility] Emergency Department Provider Note  Time seen: 9:03 PM  I have reviewed the triage vital signs and the nursing notes.   HISTORY  Chief Complaint Dizziness   HPI [name] is a 60 y.o. female with a past medical history of anxiety, fibromyalgia, hypertension, TIA, presents to the emergency department for dizziness.  According to the patient for the past 1 week she has been experiencing intermittent episodes of dizziness which she describes as the room spinning around her.  States she went to urgent care earlier today they prescribed her meclizine, she took meclizine around 1 PM and took a nap upon waking up, patient states the dizziness became significantly worse leading to nausea and vomiting so the patient came to the emergency department for evaluation.  Patient denies any weakness or numbness of any arm or leg.  States she was diagnosed with a TIA in the past but denies any long-term deficits.  Patient states she has had intermittent dizziness in the past but is never been officially diagnosed with vertigo however over the past 1 week her symptoms have been worse.  She is not sure if it is associated with head movements or not but it is intermittent in nature.  Denies lightheadedness but states it is a spinning/room spinning sensation.   Past Medical History:  Diagnosis Date   Anxiety    Asthma    Atypical chest pain    in the setting of anxiety   Back pain    Chronic pain    Diastolic heart failure ([facility])    Edema    Fibromyalgia    Hypertension    IBS (irritable bowel syndrome)    Lupus ([facility])    MVP (mitral valve prolapse) 12/22/2011   not confirmed on Echo 08/2015   Pain in limb    Positive ANA (antinuclear antibody) 01/29/2015   S/P dilatation of esophageal stricture 12/22/2011   Stroke ([place])    Syncopal episodes    TIA (transient ischemic attack)     Patient Active Problem List   Diagnosis Date Noted   Lumbar degenerative disc  disease [id number]   Diastolic heart failure ([place]) 11/11/2016   Cervical myofascial pain syndrome 01/08/2016   CTS (carpal tunnel syndrome) 12/05/2015   Radiculopathy of lumbosacral region 12/05/2015   Morning headache 08/29/2015   Daytime somnolence 08/29/2015   Obesity 08/29/2015   Snoring 08/29/2015   Paresthesias 08/29/2015   Convulsions/seizures ([place]) 08/29/2015   Altered awareness, transient    Cervical paraspinal muscle spasm 08/03/2015   Other specified transient cerebral ischemias    Essential hypertension    Noncompliance with medication regimen    TIA (transient ischemic attack) 08/02/2015   Leg swelling 01/29/2015   Positive ANA (antinuclear antibody) 01/29/2015   Dizzy spells 01/01/2015   Shortness of breath 11/20/2014   DDD (degenerative disc disease), cervical 11/09/2013   Generalized anxiety disorder 12/27/2012   Menopause syndrome 11/27/2012   Allergic rhinitis 11/27/2012   Hyperlipidemia 12/22/2011   MVP (mitral valve prolapse) 12/22/2011   S/P dilatation of esophageal stricture 12/22/2011   Atypical chest pain 08/19/2011    Past Surgical History:  Procedure Laterality Date   ABDOMINAL HYSTERECTOMY     CARDIAC CATHETERIZATION     CARDIAC CATHETERIZATION  07/2014   normal coronary arteries [facility])   SPINE SURGERY     TONSILLECTOMY      Prior to Admission medications   Medication Sig Start Date End Date Taking? Authorizing Provider  aspirin 81 MG  chewable tablet Chew 81 mg by mouth daily.    [provider]  chlorthalidone (HYGROTON) 25 MG tablet Take 25 mg by mouth daily.    [provider]  clonazePAM (KLONOPIN) 0.5 MG tablet Take 0.5 mg by mouth 2 (two) times daily as needed for anxiety.    [provider]  desvenlafaxine (PRISTIQ) 100 MG 24 hr tablet Take 100 mg by mouth daily.    [provider]  enalapril (VASOTEC) 20 MG tablet Take 2 tablets (40 mg total) by mouth daily. Patient taking differently: Take 20 mg  daily by mouth.  01/08/17   Alfonse Spruce, FNP  hydrochlorothiazide (HYDRODIURIL) 25 MG tablet Take 25 mg by mouth daily.    [provider]  metFORMIN (GLUCOPHAGE) 500 MG tablet Take by mouth 2 (two) times daily with a meal.    [provider]    Allergies  Allergen Reactions   Hydroxychloroquine Other (See Comments)   Gabapentin Other (See Comments)    Caused stroke symptoms    Hydrochlorothiazide     cramps   Metoprolol Swelling   Morphine And Related Other (See Comments)    hallucinations   Other Other (See Comments)    Other reaction(s): Other (See Comments) NARCOTICS=aggitation, hallucination Pt states allergic to "any narcotics"   Pantoprazole Sodium Other (See Comments)    blisters   Prednisone Other (See Comments)    Blisters on tongue    Family History  Problem Relation Age of Onset   Diabetes Mother    Hypertension Mother    Heart disease Mother    Arthritis/Rheumatoid Mother    Diabetes Father    Hypertension Father    COPD Father    Heart attack Father 64   Emphysema Father    Stroke Other    Coronary artery disease Other     Social History Social History   Tobacco Use   Smoking status: Never   Smokeless tobacco: Never  Substance Use Topics   Alcohol use: No    Alcohol/week: 0.0 standard drinks   Drug use: No    Review of Systems Constitutional: Negative for fever.  Intermittent dizziness/spinning Cardiovascular: Negative for chest pain. Respiratory: Negative for shortness of breath. Gastrointestinal: Negative for abdominal pain.  Positive for nausea vomiting due to the dizziness today. Genitourinary: Negative for urinary compaints Musculoskeletal: Negative for musculoskeletal complaints Skin: Negative for skin complaints  Neurological: Mild headaches, chronic per patient. All other ROS negative  ____________________________________________   PHYSICAL EXAM:  VITAL SIGNS: ED Triage Vitals  Enc Vitals Group      BP 06/27/21 1904 110/79     Pulse Rate 06/27/21 1904 (!) 56     Resp 06/27/21 1904 16     Temp 06/27/21 1904 98.8 F (37.1 C)     Temp Source 06/27/21 1904 Oral     SpO2 06/27/21 1904 100 %     Weight 06/27/21 1905 215 lb (97.5 kg)     Height 06/27/21 1905 '5\' 6"'$  (1.676 m)     Head Circumference --      Peak Flow --      Pain Score 06/27/21 1905 5     Pain Loc --      Pain Edu? --      Excl. in [place]? --     Constitutional: Alert and oriented. Well appearing and in no distress. Eyes: Normal exam ENT      Head: Normocephalic and atraumatic.  Normal tympanic membrane  Mouth/Throat: Mucous membranes are moist. Cardiovascular: Normal rate, regular rhythm.  Respiratory: Normal respiratory effort without tachypnea nor retractions. Breath sounds are clear  Gastrointestinal: Soft and nontender. No distention.   Musculoskeletal: Nontender with normal range of motion in all extremities. Neurologic:  Normal speech and language. No gross focal neurologic deficits.  5/5 motor in all extremities.  Equal grip strength bilaterally.  No pronator drift.  Intact finger-to-nose testing Skin:  Skin is warm, dry and intact.  Psychiatric: Mood and affect are normal.   ____________________________________________    EKG  EKG viewed and interpreted by myself shows sinus bradycardia 52 bpm with a narrow QRS, normal axis, largely normal intervals with nonspecific ST changes.  ____________________________________________    RADIOLOGY  CT head negative for acute abnormality  ____________________________________________   INITIAL IMPRESSION / ASSESSMENT AND PLAN / ED COURSE  Pertinent labs & imaging results that were available during my care of the patient were reviewed by me and considered in my medical decision making (see chart for details).   Patient presents to the emergency department for intermittent dizziness although worse over the past 2 days.  Describes as a room spinning sensation  but not necessarily associated with head movements as far as the patient knows.  Patient's work-up thus far is reassuring including lab work and a CT scan of the head as well as an EKG.  Differential would include BPPV/vertigo or possibly cerebellar CVA.  Patient had meclizine earlier today and states initially some improvement however upon awakening she states the dizziness returned and was severe.  Given these findings I discussed with the patient pros and cons of proceeding with an MRI.  Patient does wish to proceed with MRI to further evaluate.  We will dose Ativan as an anxiolytic and obtain an MRI.  Patient agreeable to plan of care.  Urinalysis and COVID pending.  COVID is negative.  Urinalysis is negative.  MRI is pending.  If MRI is normal anticipate discharge home.  Patient has meclizine to try at home.  Patient care signed out to oncoming provider.  VERGENE MEANEY was evaluated in Emergency Department on 06/27/2021 for the symptoms described in the history of present illness. She was evaluated in the context of the global COVID-19 pandemic, which necessitated consideration that the patient might be at risk for infection with the SARS-CoV-2 virus that causes COVID-19. Institutional protocols and algorithms that pertain to the evaluation of patients at risk for COVID-19 are in a state of rapid change based on information released by regulatory bodies including the CDC and federal and state organizations. These policies and algorithms were followed during the patient's care in the ED.  ____________________________________________   FINAL CLINICAL IMPRESSION(S) / ED DIAGNOSES  Vertigo   Harvest Dark, MD 06/27/21 2329

## 2021-06-27 NOTE — Discharge Instructions (Signed)
Please take your meclizine as prescribed to you earlier today.  Please drink plenty of fluids.  Please do not make sudden head movements either turning or sitting up/standing up quickly.  Please follow-up with your doctor for recheck/reevaluation on Monday.  Return to the emergency department for any worsening symptoms, any weakness or numbness of any arm or leg confusion or difficulty speaking.

## 2021-06-27 NOTE — ED Triage Notes (Signed)
Pt state dizziness for a week. Pt states she feels like the room is spinning. Pt states has had vomiting and nausea. Pt with history  of TIA.

## 2021-06-27 NOTE — ED Notes (Signed)
Pt OTF with MRI

## 2021-06-27 NOTE — ED Triage Notes (Signed)
First Nurse Note:  Arrives from home via [facility].  C.O dizziness for one week, worse today at work.  VS wnl.

## 2021-06-27 NOTE — ED Provider Notes (Signed)
-----------------------------------------   11:36 PM on 06/27/2021 -----------------------------------------  Blood pressure 126/88, pulse 71, temperature 98.8 F (37.1 C), temperature source Oral, resp. rate 15, height '5\' 6"'$  (1.676 m), weight 97.5 kg, SpO2 97 %.  Assuming care from Dr. Kerman Passey.  In short, [name] is a 60 y.o. female with a chief complaint of Dizziness .  Refer to the original H&P for additional details.  The current plan of care is to follow-up MRI results for dizziness.  ----------------------------------------- 12:50 AM on 06/28/2021 ----------------------------------------- MRI is negative for acute process, no evidence of stroke.  Patient reports feeling better following Ativan and is appropriate for discharge home with PCP follow-up.  She was prescribed Zofran in addition to the previously prescribed meclizine.  She was counseled to take these as needed and to return to the ED for new or worsening symptoms.  Patient agrees with plan.    Blake Divine, MD 06/28/21 [phone]

## 2021-06-28 MED ORDER — ONDANSETRON 4 MG PO TBDP: 4.0000 mg | ORAL_TABLET | Freq: Three times a day (TID) | ORAL | 0 refills | Status: AC | PRN

## 2021-06-28 MED ORDER — ONDANSETRON 4 MG PO TBDP
4.0000 mg | ORAL_TABLET | Freq: Three times a day (TID) | ORAL | 0 refills | Status: DC | PRN
Start: 2021-06-28 — End: 2021-06-28

## 2021-06-28 NOTE — ED Notes (Signed)
Daughter given dispo instructions r/t pt sedation with ativan for MRI.

## 2021-12-03 ENCOUNTER — Ambulatory Visit: Payer: Self-pay | Admitting: Cardiology

## 2021-12-03 NOTE — Progress Notes (Deleted)
Primary Physician/Referring:  Virgia Land, MD  Patient ID: [name], female    DOB: 18-Mar-1961, 61 y.o.   MRN: [id number]  No chief complaint on file.  HPI:    [name]  is a 61 y.o. African-American female patient with fibromyalgia, depression, primary hypertension, OSA and chronic fatigue referred to me for evaluation of shortness of breath, patient last evaluated by cardiology 3 weeks ago.  Patient has been followed by [facility] in [facility].  ***  Past Medical History:  Diagnosis Date   Anxiety    Asthma    Atypical chest pain    in the setting of anxiety   Back pain    Chronic pain    Diastolic heart failure (HCC)    Edema    Fibromyalgia    Hypertension    IBS (irritable bowel syndrome)    Lupus (HCC)    MVP (mitral valve prolapse) 12/22/2011   not confirmed on Echo 08/2015   Pain in limb    Positive ANA (antinuclear antibody) 01/29/2015   S/P dilatation of esophageal stricture 12/22/2011   Stroke ([place])    Syncopal episodes    TIA (transient ischemic attack)    Past Surgical History:  Procedure Laterality Date   ABDOMINAL HYSTERECTOMY     CARDIAC CATHETERIZATION     CARDIAC CATHETERIZATION  07/2014   normal coronary arteries [facility])   SPINE SURGERY     TONSILLECTOMY     Family History  Problem Relation Age of Onset   Diabetes Mother    Hypertension Mother    Heart disease Mother    Arthritis/Rheumatoid Mother    Diabetes Father    Hypertension Father    COPD Father    Heart attack Father 30   Emphysema Father    Stroke Other    Coronary artery disease Other     Social History   Tobacco Use   Smoking status: Never   Smokeless tobacco: Never  Substance Use Topics   Alcohol use: No    Alcohol/week: 0.0 standard drinks   Marital Status: Legally Separated  ROS  ***ROS Objective  There were no vitals taken for this visit. There is no height or weight on file to calculate BMI.  Vitals with BMI  06/28/2021 06/28/2021 06/27/2021  Height - - -  Weight - - -  BMI - - -  Systolic 97 99 220  Diastolic 64 61 88  Pulse 56 61 71     ***Physical Exam   Medications and allergies   Allergies  Allergen Reactions   Hydroxychloroquine Other (See Comments)   Gabapentin Other (See Comments)    Caused stroke symptoms    Hydrochlorothiazide     cramps   Metoprolol Swelling   Morphine And Related Other (See Comments)    hallucinations   Other Other (See Comments)    Other reaction(s): Other (See Comments) NARCOTICS=aggitation, hallucination Pt states allergic to "any narcotics"   Pantoprazole Sodium Other (See Comments)    blisters   Prednisone Other (See Comments)    Blisters on tongue     Medication prior to this encounter:   Outpatient Medications Prior to Visit  Medication Sig Dispense Refill   aspirin 81 MG chewable tablet Chew 81 mg by mouth daily.     chlorthalidone (HYGROTON) 25 MG tablet Take 25 mg by mouth daily.     clonazePAM (KLONOPIN) 0.5 MG tablet Take 0.5 mg by mouth 2 (two) times daily  as needed for anxiety.     desvenlafaxine (PRISTIQ) 100 MG 24 hr tablet Take 100 mg by mouth daily.     enalapril (VASOTEC) 20 MG tablet Take 2 tablets (40 mg total) by mouth daily. (Patient taking differently: Take 20 mg daily by mouth. ) 30 tablet 2   hydrochlorothiazide (HYDRODIURIL) 25 MG tablet Take 25 mg by mouth daily.     metFORMIN (GLUCOPHAGE) 500 MG tablet Take by mouth 2 (two) times daily with a meal.     ondansetron (ZOFRAN ODT) 4 MG disintegrating tablet Take 1 tablet (4 mg total) by mouth every 8 (eight) hours as needed for nausea or vomiting. 12 tablet 0   No facility-administered medications prior to visit.     Medication list after today's encounter   Current Outpatient Medications  Medication Instructions   aspirin 81 mg, Oral, Daily   chlorthalidone (HYGROTON) 25 mg, Oral, Daily   clonazePAM (KLONOPIN) 0.5 mg, Oral, 2 times daily PRN   desvenlafaxine  (PRISTIQ) 100 mg, Oral, Daily   enalapril (VASOTEC) 40 mg, Oral, Daily   hydrochlorothiazide (HYDRODIURIL) 25 mg, Oral, Daily   metFORMIN (GLUCOPHAGE) 500 MG tablet Oral, 2 times daily with meals   ondansetron (ZOFRAN ODT) 4 mg, Oral, Every 8 hours PRN    Laboratory examination:   Recent Labs    06/27/21 1913  NA 137  K 3.8  CL 100  CO2 30  GLUCOSE 93  BUN 18  CREATININE 0.77  CALCIUM 9.4  GFRNONAA >60   CrCl cannot be calculated (Patient's most recent lab result is older than the maximum 21 days allowed.).  CMP Latest Ref Rng & Units 06/27/2021 11/18/2018 11/18/2018  Glucose 70 - 99 mg/dL 93 82 85  BUN 6 - 20 mg/dL _0 Creatinine 0.44 - 1.00 mg/dL 0.77 0.70 0.77  Sodium 135 - 145 mmol/L 137 139 140  Potassium 3.5 - 5.1 mmol/L 3.8 3.5 3.6  Chloride 98 - 111 mmol/L 100 101 103  CO2 22 - 32 mmol/L 30 - 26  Calcium 8.9 - 10.3 mg/dL 9.4 - 9.3  Total Protein 6.5 - 8.1 g/dL 7.0 - 7.0  Total Bilirubin 0.3 - 1.2 mg/dL 0.5 - 0.4  Alkaline Phos 38 - 126 U/L 66 - 62  AST 15 - 41 U/L 24 - 23  ALT 0 - 44 U/L 16 - 15   CBC Latest Ref Rng & Units 06/27/2021 11/18/2018 11/18/2018  WBC 4.0 - 10.5 K/uL 6.6 - 6.2  Hemoglobin 12.0 - 15.0 g/dL 12.5 12.6 13.2  Hematocrit 36.0 - 46.0 % 36.8 37.0 39.9  Platelets 150 - 400 K/uL 176 - 176    Lipid Panel No results for input(s): CHOL, TRIG, LDLCALC, VLDL, HDL, CHOLHDL, LDLDIRECT in the last 8760 hours. Lipid Panel     Component Value Date/Time   CHOL 201 (H) 08/03/2015 0648   TRIG 207 (H) 08/03/2015 0648   HDL 50 08/03/2015 0648   CHOLHDL 4.0 08/03/2015 0648   VLDL 41 (H) 08/03/2015 0648   LDLCALC 110 (H) 08/03/2015 0648     HEMOGLOBIN A1C Lab Results  Component Value Date   HGBA1C 6.2 (H) 08/03/2015   MPG 131 08/03/2015   TSH No results for input(s): TSH in the last 8760 hours.  External labs:   Labs 11/13/2021:  proBNP 33.  Labs 05/29/2021:  Anti-DNA double-stranded negative, smooth muscle antibody and RNP and [place] antibody negative, aldolase negative,  Total cholesterol 195, triglycerides 186, HDL 52, LDL  106.  TSH, T4 normal.  CK enzymes normal.  Radiology:   No results found.  Cardiac Studies:   Coronary angiogram 03/20/2011: Normal right dominant circulation coronary angiography.  Normal LVEF and normal EDP.  Echocardiogram 03/06/2020: NORMAL LEFT VENTRICULAR SYSTOLIC FUNCTION WITH MILD LVH NORMAL RIGHT VENTRICULAR SYSTOLIC FUNCTION VALVULAR REGURGITATION: TRIVIAL PR, TRIVIAL TR NO VALVULAR STENOSIS No significant change from echocardiogram 12/22/2011.  Extended EKG monitoring 3 to 7 days starting 05/28/2021: Observed rhythms are sinus bradycardia to sinus tachycardia. No pauses. No atrial fibrillation / flutter.  Average Heart Rate 76 bpm. Minimum HR 51 bpm, Day 4 / 01:57:36 am; Maximum HR 150 bpm, Day 1 / 11:26:56 am. 26 PSVCs (< 0.01 %). 1 occurrence of  Supraventricular Tachycardia (3 beats, 112 bpm, Day 1 / 06:23:44 am). 1 Patient reported event: chest pain / pressure corresponding to sinus rhythm ag 86 bpm.  Cardiopulmonary stress test 06/03/2021: 1. Based on the peak oxygen consumption (VO2) achieved, functional capacity   would be consistent with no functional impairment, with peak VO2 17.1 ml/kg/min (103.6% of predicted normals). Normative peak predicted VO2   values are corrected for age, gender, and weight.  2. Ventilatory reserve limits were approached at peak exercise without  apparent pulmonary limitation to exercise. Normal pulmonary physiology at  rest. The MVV is normal. Exercise oscillatory ventilation criteria was not met.  3.  Stress EKG, report not available.  EKG:   ***  ***  Assessment  No diagnosis found.   There are no discontinued medications.  No orders of the defined types were placed in this encounter.  No orders of the defined types were placed in this encounter.  Recommendations:   [name] is a 61 y.o. Patient Race: African-American female  patient with fibromyalgia, depression, primary hypertension, OSA and chronic fatigue referred to me for evaluation of shortness of breath, patient last evaluated by cardiology 3 weeks ago.  Patient has been followed by [facility] in [facility].  ***    Adrian Prows, MD, [facility] 12/03/2021, 12:20 PM Office: [phone]

## 2022-11-10 ENCOUNTER — Ambulatory Visit
Admission: EM | Admit: 2022-11-10 | Discharge: 2022-11-10 | Disposition: A | Discharge status: Home / Self Care | Payer: BC Managed Care – PPO

## 2022-11-10 ENCOUNTER — Ambulatory Visit (INDEPENDENT_AMBULATORY_CARE_PROVIDER_SITE_OTHER): Payer: BC Managed Care – PPO

## 2022-11-10 DIAGNOSIS — R058 Other specified cough: Secondary | ICD-10-CM

## 2022-11-10 DIAGNOSIS — R600 Localized edema: Secondary | ICD-10-CM

## 2022-11-10 DIAGNOSIS — J4521 Mild intermittent asthma with (acute) exacerbation: Secondary | ICD-10-CM | POA: Diagnosis not present

## 2022-11-10 MED ORDER — PREDNISONE 10 MG PO TABS: 20.0000 mg | ORAL_TABLET | Freq: Every day | ORAL | 0 refills | Status: AC

## 2022-11-10 NOTE — ED Triage Notes (Signed)
Patient to Urgent Care with complaints of cough and chest congestion. Symptoms started last week.   Diagnosed with acute non-recurrent pansinusitis w/ asthma exacerbation on 1/5 via telehealth visit. Prescribed promethazine-DM, doxycycline and albuterol inhaler.    Reports now she is having sensitive skin to touch, generalized body aches and joint pain after starting prescribed medications. Reports she also has pain when taking deep breaths in her neck and the center of her chest. Also reports bilateral foot swelling (left more)- states that her cardiologist changed her bp meds and she is concerned it is not working correctly but also reports she thinks it could be due to the cough medication.

## 2022-11-10 NOTE — ED Provider Notes (Signed)
[name]    CSN: [id number] Arrival date & time: 11/10/22  1012      History   Chief Complaint Chief Complaint  Patient presents with   Cough    HPI [name] is a 62 y.o. female.  Patient presents with 1 week history of congestion, cough, shortness of breath.  She also reports skin is tender to touch on both arms.  She also reports bilateral feet swelling.  She denies rash, fever, chills, chest pain, vomiting, diarrhea, or other symptoms.  Patient had a telemedicine visit on 11/06/2022; diagnosed with sinusitis and asthma exacerbation; treated with albuterol inhaler, doxycycline, promethazine DM; she is taking these medications as prescribed.  Her medical history includes asthma, hypertension, heart failure, mitral valve prolapse, stroke, Lupus, IBS, seizures, anxiety, depression, fibromyalgia, chronic pain.    The history is provided by the patient and medical records.    Past Medical History:  Diagnosis Date   Anxiety    Asthma    Atypical chest pain    in the setting of anxiety   Back pain    Chronic pain    Diastolic heart failure (HCC)    Edema    Fibromyalgia    Hypertension    IBS (irritable bowel syndrome)    Lupus (HCC)    MVP (mitral valve prolapse) 12/22/2011   not confirmed on Echo 08/2015   Pain in limb    Positive ANA (antinuclear antibody) 01/29/2015   S/P dilatation of esophageal stricture 12/22/2011   Stroke ([place])    Syncopal episodes    TIA (transient ischemic attack)     Patient Active Problem List   Diagnosis Date Noted   Lumbar degenerative disc disease 97/35/3299   Diastolic heart failure ([place]) 11/11/2016   Cervical myofascial pain syndrome 01/08/2016   CTS (carpal tunnel syndrome) 12/05/2015   Radiculopathy of lumbosacral region 12/05/2015   Morning headache 08/29/2015   Daytime somnolence 08/29/2015   Obesity 08/29/2015   Snoring 08/29/2015   Paresthesias 08/29/2015   Convulsions/seizures ([place]) 08/29/2015   Altered  awareness, transient    Cervical paraspinal muscle spasm 08/03/2015   Other specified transient cerebral ischemias    Essential hypertension    Noncompliance with medication regimen    TIA (transient ischemic attack) 08/02/2015   Leg swelling 01/29/2015   Positive ANA (antinuclear antibody) 01/29/2015   Dizzy spells 01/01/2015   Shortness of breath 11/20/2014   DDD (degenerative disc disease), cervical 11/09/2013   Generalized anxiety disorder 12/27/2012   Menopause syndrome 11/27/2012   Allergic rhinitis 11/27/2012   Hyperlipidemia 12/22/2011   MVP (mitral valve prolapse) 12/22/2011   S/P dilatation of esophageal stricture 12/22/2011   Atypical chest pain 08/19/2011    Past Surgical History:  Procedure Laterality Date   ABDOMINAL HYSTERECTOMY     CARDIAC CATHETERIZATION     CARDIAC CATHETERIZATION  07/2014   normal coronary arteries [facility])   SPINE SURGERY     TONSILLECTOMY      OB History   No obstetric history on file.      Home Medications    Prior to Admission medications   Medication Sig Start Date End Date Taking? Authorizing Provider  albuterol (VENTOLIN HFA) 108 (90 Base) MCG/ACT inhaler Inhale into the lungs. 05/03/18 11/06/23 Yes [provider]  predniSONE (DELTASONE) 10 MG tablet Take 2 tablets (20 mg total) by mouth daily for 3 days. 11/10/22 11/13/22 Yes Sharion Balloon, NP  aspirin 81 MG chewable tablet Chew 81 mg by mouth  daily.    [provider]  chlorthalidone (HYGROTON) 25 MG tablet Take 25 mg by mouth daily.    [provider]  clonazePAM (KLONOPIN) 0.5 MG tablet Take 0.5 mg by mouth 2 (two) times daily as needed for anxiety.    [provider]  desvenlafaxine (PRISTIQ) 100 MG 24 hr tablet Take 100 mg by mouth daily.    [provider]  doxycycline (VIBRAMYCIN) 100 MG capsule Take 100 mg by mouth 2 (two) times daily.    [provider]  enalapril (VASOTEC) 20 MG tablet Take 2 tablets (40 mg total) by  mouth daily. Patient taking differently: Take 20 mg daily by mouth.  01/08/17   Alfonse Spruce, FNP  Enalapril-hydroCHLOROthiazide 5-12.5 MG tablet enalapril 5 mg-hydrochlorothiazide 12.5 mg tablet    [provider]  hydrochlorothiazide (HYDRODIURIL) 25 MG tablet Take 25 mg by mouth daily.    [provider]  metFORMIN (GLUCOPHAGE) 500 MG tablet Take by mouth 2 (two) times daily with a meal.    [provider]  ondansetron (ZOFRAN ODT) 4 MG disintegrating tablet Take 1 tablet (4 mg total) by mouth every 8 (eight) hours as needed for nausea or vomiting. 06/28/21   Blake Divine, MD  promethazine-dextromethorphan (PROMETHAZINE-DM) 6.25-15 MG/5ML syrup Take 5 mLs by mouth every 6 (six) hours as needed.    [provider]    Family History Family History  Problem Relation Age of Onset   Diabetes Mother    Hypertension Mother    Heart disease Mother    Arthritis/Rheumatoid Mother    Diabetes Father    Hypertension Father    COPD Father    Heart attack Father 39   Emphysema Father    Stroke Other    Coronary artery disease Other     Social History Social History   Tobacco Use   Smoking status: Never   Smokeless tobacco: Never  Substance Use Topics   Alcohol use: No    Alcohol/week: 0.0 standard drinks of alcohol   Drug use: No     Allergies   Hydroxychloroquine, Gabapentin, Hydrochlorothiazide, Metoprolol, Morphine and related, Other, Pantoprazole sodium, and Prednisone   Review of Systems Review of Systems  Constitutional:  Negative for chills and fever.  HENT:  Positive for congestion. Negative for ear pain and sore throat.   Respiratory:  Positive for cough and shortness of breath.   Cardiovascular:  Positive for leg swelling. Negative for chest pain and palpitations.  Gastrointestinal:  Negative for diarrhea and vomiting.  Musculoskeletal:  Negative for arthralgias and back pain.  Skin:  Negative for color change and rash.   All other systems reviewed and are negative.    Physical Exam Triage Vital Signs ED Triage Vitals  Enc Vitals Group     BP 11/10/22 1050 111/76     Pulse Rate 11/10/22 1036 77     Resp 11/10/22 1036 18     Temp 11/10/22 1036 98.6 F (37 C)     Temp src --      SpO2 11/10/22 1036 95 %     Weight 11/10/22 1045 218 lb (98.9 kg)     Height 11/10/22 1045 '5\' 6"'$  (1.676 m)     Head Circumference --      Peak Flow --      Pain Score 11/10/22 1039 10     Pain Loc --      Pain Edu? --      Excl. in [place]? --  No data found.  Updated Vital Signs BP 111/76   Pulse 77   Temp 98.6 F (37 C)   Resp 18   Ht '5\' 6"'$  (1.676 m)   Wt 218 lb (98.9 kg)   SpO2 95%   BMI 35.19 kg/m   Visual Acuity Right Eye Distance:   Left Eye Distance:   Bilateral Distance:    Right Eye Near:   Left Eye Near:    Bilateral Near:     Physical Exam Vitals and nursing note reviewed.  Constitutional:      General: She is not in acute distress.    Appearance: She is well-developed. She is not ill-appearing.  HENT:     Right Ear: Tympanic membrane normal.     Left Ear: Tympanic membrane normal.     Nose: Nose normal.     Mouth/Throat:     Mouth: Mucous membranes are moist.     Pharynx: Oropharynx is clear.     Comments: Clear PND. Cardiovascular:     Rate and Rhythm: Normal rate and regular rhythm.     Heart sounds: Normal heart sounds.  Pulmonary:     Effort: Pulmonary effort is normal. No respiratory distress.     Breath sounds: Normal breath sounds. No wheezing, rhonchi or rales.  Musculoskeletal:        General: Normal range of motion.     Cervical back: Neck supple.     Right lower leg: Edema present.     Left lower leg: Edema present.     Comments: Bilateral 1+ edema in LE.  Skin:    General: Skin is warm and dry.  Neurological:     Mental Status: She is alert.  Psychiatric:        Mood and Affect: Mood normal.        Behavior: Behavior normal.      UC Treatments / Results   Labs (all labs ordered are listed, but only abnormal results are displayed) Labs Reviewed - No data to display  EKG   Radiology DG Chest 2 View  Result Date: 11/10/2022 CLINICAL DATA:  Cough.  Cough and chest congestion. EXAM: CHEST - 2 VIEW COMPARISON:  Chest radiograph dated May 03, 2018. FINDINGS: The heart size and mediastinal contours are within normal limits. Both lungs are clear. The visualized skeletal structures are unremarkable. IMPRESSION: No active cardiopulmonary disease. Electronically Signed   By: Keane Police D.O.   On: 11/10/2022 11:19    Procedures Procedures (including critical care time)  Medications Ordered in UC Medications - No data to display  Initial Impression / Assessment and Plan / UC Course  I have reviewed the triage vital signs and the nursing notes.  Pertinent labs & imaging results that were available during my care of the patient were reviewed by me and considered in my medical decision making (see chart for details).    Productive cough, asthma exacerbation, bilateral LE edema.  CXR negative.  Patient is currently on doxycycline, albuterol inhaler, and promethazine DM that were prescribed on an e-visit 4 days ago.  She reports she did not want prednisone because she had history of tongue ulcers with this medication.  She agrees to try 3 day course of lower dose prednisone (20 mg).  Instructed patient to follow up with her PCP tomorrow.  Education provided on asthma and edema.  She agrees to plan of care.    Final Clinical Impressions(s) / UC Diagnoses   Final diagnoses:  Productive cough  Bilateral lower extremity edema  Mild intermittent asthma with acute exacerbation     Discharge Instructions      Take the prednisone as directed.  Continue your current medications.  Follow up with your primary care provider tomorrow.        ED Prescriptions     Medication Sig Dispense Auth. Provider   predniSONE (DELTASONE) 10 MG tablet Take 2  tablets (20 mg total) by mouth daily for 3 days. 6 tablet Sharion Balloon, NP      PDMP not reviewed this encounter.   Sharion Balloon, NP 11/10/22 1131

## 2022-11-10 NOTE — Discharge Instructions (Addendum)
Take the prednisone as directed.  Continue your current medications.  Follow up with your primary care provider tomorrow.

## 2023-03-09 ENCOUNTER — Emergency Department (HOSPITAL_COMMUNITY)
Admission: EM | Admit: 2023-03-09 | Discharge: 2023-03-09 | Disposition: A | Discharge status: Home / Self Care | Payer: BC Managed Care – PPO | Attending: Emergency Medicine | Admitting: Emergency Medicine

## 2023-03-09 ENCOUNTER — Emergency Department (HOSPITAL_COMMUNITY): Payer: BC Managed Care – PPO

## 2023-03-09 DIAGNOSIS — Z8673 Personal history of transient ischemic attack (TIA), and cerebral infarction without residual deficits: Secondary | ICD-10-CM | POA: Diagnosis not present

## 2023-03-09 DIAGNOSIS — E876 Hypokalemia: Secondary | ICD-10-CM | POA: Diagnosis not present

## 2023-03-09 DIAGNOSIS — R569 Unspecified convulsions: Secondary | ICD-10-CM

## 2023-03-09 DIAGNOSIS — I5030 Unspecified diastolic (congestive) heart failure: Secondary | ICD-10-CM | POA: Diagnosis not present

## 2023-03-09 DIAGNOSIS — Z7982 Long term (current) use of aspirin: Secondary | ICD-10-CM | POA: Insufficient documentation

## 2023-03-09 DIAGNOSIS — R55 Syncope and collapse: Secondary | ICD-10-CM | POA: Insufficient documentation

## 2023-03-09 DIAGNOSIS — Z79899 Other long term (current) drug therapy: Secondary | ICD-10-CM | POA: Insufficient documentation

## 2023-03-09 DIAGNOSIS — I110 Hypertensive heart disease with heart failure: Secondary | ICD-10-CM | POA: Insufficient documentation

## 2023-03-09 LAB — URINALYSIS, ROUTINE W REFLEX MICROSCOPIC
Bilirubin Urine: NEGATIVE
Glucose, UA: NEGATIVE mg/dL
Hgb urine dipstick: NEGATIVE
Ketones, ur: NEGATIVE mg/dL
Leukocytes,Ua: NEGATIVE
Nitrite: NEGATIVE
Protein, ur: NEGATIVE mg/dL
Specific Gravity, Urine: 1.015 (ref 1.005–1.030)
pH: 6 (ref 5.0–8.0)

## 2023-03-09 LAB — COMPREHENSIVE METABOLIC PANEL
ALT: 19 U/L (ref 0–44)
AST: 23 U/L (ref 15–41)
Albumin: 3.9 g/dL (ref 3.5–5.0)
Alkaline Phosphatase: 68 U/L (ref 38–126)
Anion gap: 13 (ref 5–15)
BUN: 14 mg/dL (ref 8–23)
CO2: 25 mmol/L (ref 22–32)
Calcium: 9.1 mg/dL (ref 8.9–10.3)
Chloride: 100 mmol/L (ref 98–111)
Creatinine, Ser: 0.86 mg/dL (ref 0.44–1.00)
GFR, Estimated: >60 mL/min (ref >60)
Glucose, Bld: 96 mg/dL (ref 70–99)
Potassium: 3.4 mmol/L — ABNORMAL LOW (ref 3.5–5.1)
Sodium: 138 mmol/L (ref 135–145)
Total Bilirubin: 0.5 mg/dL (ref 0.3–1.2)
Total Protein: 6.9 g/dL (ref 6.5–8.1)

## 2023-03-09 LAB — CBC
HCT: 37.4 % (ref 36.0–46.0)
Hemoglobin: 12.2 g/dL (ref 12.0–15.0)
MCH: 30.5 pg (ref 26.0–34.0)
MCHC: 32.6 g/dL (ref 30.0–36.0)
MCV: 93.5 fL (ref 80.0–100.0)
Platelets: 159 10*3/uL (ref 150–400)
RBC: 4 MIL/uL (ref 3.87–5.11)
RDW: 12.4 % (ref 11.5–15.5)
WBC: 5.6 10*3/uL (ref 4.0–10.5)
nRBC: 0 % (ref 0.0–0.2)

## 2023-03-09 LAB — TROPONIN I (HIGH SENSITIVITY): Troponin I (High Sensitivity): 2 ng/L (ref <18)

## 2023-03-09 LAB — BRAIN NATRIURETIC PEPTIDE: B Natriuretic Peptide: 24.8 pg/mL (ref 0.0–100.0)

## 2023-03-09 LAB — RAPID URINE DRUG SCREEN, HOSP PERFORMED
Amphetamines: NOT DETECTED
Barbiturates: NOT DETECTED
Benzodiazepines: NOT DETECTED
Cocaine: NOT DETECTED
Opiates: NOT DETECTED
Tetrahydrocannabinol: NOT DETECTED

## 2023-03-09 NOTE — ED Triage Notes (Signed)
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  Positive LOC, no apparent shaking but eyes movement was noted. EMs endorses that she appeared postictal for about 10 min after event.    She has had a few of these events. Pt has hx of an idiopathic neurological condition that is being worked up at [facility].   Hx of L.leg swelling with elevated d-dimer.  Has had full workup for that. Hx of stroke/TIA with no residual deficits.  Hx of Lupus.  116/82 98% HR 72 RR 14 CBG 115. A&O x4

## 2023-03-09 NOTE — Discharge Instructions (Signed)
Please follow-up with your primary care doctor and neurologist at [facility] at your earliest convenience, and to you are able to follow-up with neurologist I recommend that you refrain from driving, as you can imagine if you were to have a new seizure while you are driving it would be dangerous for both yourself and for anybody else on the road.  I recommend that you drink plenty of fluids, rest, and monitor yourself for any return of seizure-like symptoms.  To help expedite your care it may be helpful to present to [facility] emergency department.  To have any future seizure-like activity since they are more familiar with your history, but we are happy to see you in our emergency department in the future

## 2023-03-09 NOTE — ED Provider Notes (Signed)
[facility] Provider Note   CSN: [id number] Arrival date & time: 03/09/23  1155     History  Chief Complaint  Patient presents with   Loss of Consciousness    [name] is a 62 y.o. female with past medical history significant for mitral valve prolapse, previous esophageal stricture status post dilatation, anxiety, degenerative disc disease, positive ANA, previous TIAs, previous syncopal episodes versus seizures, patient reports that she did have an abnormal EEG in the past, hypertension, transient cerebral ischemia, dizzy spells, diastolic heart failure, fibromyalgia who presents with concern for syncope versus seizure-like activity earlier today.  At around 1130 patient with headache to top right of head, began to feel tired, then had a positive loss of consciousness.  Cording to witnesses patient without apparent shaking but did have some rapid eye movement.  EMS endorsed postictal period for around 10 minutes after the event.  Patient reports that she has had several of these events, last in around January.  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  She denies any chest pain, shortness of breath.   Loss of Consciousness      Home Medications Prior to Admission medications   Medication Sig Start Date End Date Taking? Authorizing Provider  albuterol (VENTOLIN HFA) 108 (90 Base) MCG/ACT inhaler Inhale into the lungs. 05/03/18 11/06/23  [provider]  aspirin 81 MG chewable tablet Chew 81 mg by mouth daily.    [provider]  chlorthalidone (HYGROTON) 25 MG tablet Take 25 mg by mouth daily.    [provider]  clonazePAM (KLONOPIN) 0.5 MG tablet Take 0.5 mg by mouth 2 (two) times daily as needed for anxiety.    [provider]  desvenlafaxine (PRISTIQ) 100 MG 24 hr tablet Take 100 mg by mouth daily.    [provider]  doxycycline (VIBRAMYCIN) 100 MG capsule Take 100 mg by mouth 2 (two) times daily.    [provider]  enalapril (VASOTEC) 20 MG tablet Take 2 tablets (40 mg total) by mouth daily. Patient taking differently: Take 20 mg daily by mouth.  01/08/17   Lizbeth Bark, FNP  Enalapril-hydroCHLOROthiazide 5-12.5 MG tablet enalapril 5 mg-hydrochlorothiazide 12.5 mg tablet    [provider]  hydrochlorothiazide (HYDRODIURIL) 25 MG tablet Take 25 mg by mouth daily.    [provider]  metFORMIN (GLUCOPHAGE) 500 MG tablet Take by mouth 2 (two) times daily with a meal.    [provider]  ondansetron (ZOFRAN ODT) 4 MG disintegrating tablet Take 1 tablet (4 mg total) by mouth every 8 (eight) hours as needed for nausea or vomiting. 06/28/21   Chesley Noon, MD  promethazine-dextromethorphan (PROMETHAZINE-DM) 6.25-15 MG/5ML syrup Take 5 mLs by mouth every 6 (six) hours as needed.    [provider]      Allergies    Hydroxychloroquine, Gabapentin, Hydrochlorothiazide, Metoprolol, Morphine and related, Other, Pantoprazole sodium, and Prednisone    Review of Systems   Review of Systems  Cardiovascular:  Positive for syncope.  All other systems reviewed and are negative.   Physical Exam Updated Vital Signs BP 108/71   Pulse 64   Temp 98.3 F (36.8 C)   Resp (!) 22   SpO2 99%  Physical Exam Vitals and nursing note reviewed.  Constitutional:      General: She is not in acute distress.    Appearance: Normal appearance.  HENT:     Head: Normocephalic and atraumatic.  Eyes:     General:        Right eye: No discharge.        Left eye: No discharge.  Cardiovascular:     Rate and Rhythm: Normal rate and regular rhythm.     Heart sounds: No murmur heard.    No friction rub. No gallop.  Pulmonary:     Effort: Pulmonary effort is normal.     Breath sounds: Normal breath sounds.     Comments: No wheezing, rhonchi, stridor,  rales Abdominal:     General: Bowel sounds are normal.     Palpations: Abdomen is soft.  Skin:    General: Skin is warm and dry.     Capillary Refill: Capillary refill takes less than 2 seconds.  Neurological:     Mental Status: She is alert and oriented to person, place, and time.     Comments: Moves all 4 limbs spontaneously, CN II through XII grossly intact, can ambulate without difficulty, intact sensation throughout.   Psychiatric:        Mood and Affect: Mood normal.        Behavior: Behavior normal.     ED Results / Procedures / Treatments   Labs (all labs ordered are listed, but only abnormal results are displayed) Labs Reviewed  COMPREHENSIVE METABOLIC PANEL - Abnormal; Notable for the following components:      Result Value   Potassium 3.4 (*)    All other components within normal limits  CBC  BRAIN NATRIURETIC PEPTIDE  URINALYSIS, ROUTINE W REFLEX MICROSCOPIC  RAPID URINE DRUG SCREEN, HOSP PERFORMED  TROPONIN I (HIGH SENSITIVITY)    EKG None  Radiology No results found.  Procedures Procedures    Medications Ordered in ED Medications - No data to display  ED Course/ Medical Decision Making/ A&P                             Medical Decision Making Amount and/or Complexity of Data Reviewed Labs: ordered. Radiology: ordered.   This patient is a 62 y.o. female who presents to the ED for concern of syncope and collapse vs seizure like activity, this involves an extensive number of treatment options, and is a complaint that carries with it a high risk of complications and morbidity. The emergent differential diagnosis prior to evaluation includes, but is not limited to,  CVA, ACS, arrhythmia, vasovagal syncope, orthostatic hypotension, sepsis, hypoglycemia, electrolyte disturbance, respiratory failure, symptomatic anemia, dehydration, heat injury, polypharmacy, malignancy, anxiety/panic attack, seizures, epilepsy, vs other . This is not an exhaustive  differential.   Past Medical History / Co-morbidities / Social History: mitral valve prolapse, previous esophageal stricture status post dilatation, anxiety, degenerative disc disease, positive ANA, previous TIAs, previous syncopal episodes versus seizures, patient reports that she did have an abnormal EEG in the past, hypertension, transient cerebral ischemia, dizzy spells, diastolic heart failure, fibromyalgia  Additional history: Chart reviewed. Pertinent results include: Reviewed extensive lab work, imaging from outpatient neurology, sleep study visits, previous abnormal EEG, patient is not currently on any medication for seizures reportedly, most of her outside records reviewed are from [facility]  Physical Exam: Physical exam performed. The pertinent findings include: Patient is overall well-appearing with no focal neurologic deficits, check 1 brief documented episode of tachypnea, but does not seem to be in any respiratory distress, does not have any accessory breath sounds.  She is  overall well-appearing with no rash or other skin changes.  Lab Tests: I ordered, and personally interpreted labs.  The pertinent results include: CMP with very mild hypokalemia, potassium 3.4, otherwise unremarkable, troponin negative x 1 context of no active chest pain, BMP unremarkable, UDS normal, UA unremarkable, CBC unremarkable.   Imaging Studies: I ordered imaging studies including CT head without contrast, plain film chest x-ray. I independently visualized and interpreted imaging which showed no evidence of acute intracranial, intrathoracic abnormality. I agree with the radiologist interpretation.   Cardiac Monitoring:  The patient was maintained on a cardiac monitor.  My attending physician Dr. Charm Barges viewed and interpreted the cardiac monitored which showed an underlying rhythm of: NSR. I agree with this interpretation.   Disposition: After consideration of the diagnostic results and the patients response  to treatment, I feel that patient reports that she has been extensively worked up in the past for syncope versus seizure episodes at [facility], she is planned to follow-up with her PCP on Thursday and has plan to see neurology at some time soon, although I considered admission given multiple syncope/seizure-like activity with unclear cause at this time pending as patient has established team who is working her up already that it is reasonable to discharge at this time with seizure precautions, having patient refrain from driving until she is able to follow-up with her specialists, patient understands agrees to this plan and requests discharge at this time, I think that this is reasonable..   emergency department workup does not suggest an emergent condition requiring admission or immediate intervention beyond what has been performed at this time. The plan is: as above. The patient is safe for discharge and has been instructed to return immediately for worsening symptoms, change in symptoms or any other concerns.  I discussed this case with my attending physician Dr. Charm Barges who cosigned this note including patient's presenting symptoms, physical exam, and planned diagnostics and interventions. Attending physician stated agreement with plan or made changes to plan which were implemented.    Final Clinical Impression(s) / ED Diagnoses Final diagnoses:  Syncope and collapse  Seizure-like activity [facility])    Rx / DC Orders ED Discharge Orders     None         Olene Floss, PA-C 03/09/23 1647    Terrilee Files, MD 03/10/23 [phone]

## 2023-04-16 ENCOUNTER — Encounter: Payer: Self-pay | Admitting: Emergency Medicine

## 2023-04-16 ENCOUNTER — Emergency Department
Admission: EM | Admit: 2023-04-16 | Discharge: 2023-04-16 | Disposition: A | Discharge status: Home / Self Care | Payer: BC Managed Care – PPO | Attending: Emergency Medicine | Admitting: Emergency Medicine

## 2023-04-16 ENCOUNTER — Emergency Department: Payer: BC Managed Care – PPO

## 2023-04-16 ENCOUNTER — Other Ambulatory Visit: Payer: Self-pay

## 2023-04-16 DIAGNOSIS — W19XXXA Unspecified fall, initial encounter: Secondary | ICD-10-CM | POA: Insufficient documentation

## 2023-04-16 DIAGNOSIS — M5431 Sciatica, right side: Secondary | ICD-10-CM | POA: Insufficient documentation

## 2023-04-16 DIAGNOSIS — M5432 Sciatica, left side: Secondary | ICD-10-CM | POA: Insufficient documentation

## 2023-04-16 DIAGNOSIS — M79661 Pain in right lower leg: Secondary | ICD-10-CM | POA: Diagnosis present

## 2023-04-16 NOTE — ED Notes (Signed)
Pt ambulated to the restroom independently with a steady gait.

## 2023-04-16 NOTE — Discharge Instructions (Addendum)
If you have the medicine meloxicam/Mobic and the medicine tizanidine/Zanaflex she may take these medicines.  Otherwise take 4 of the ibuprofen every 6 hours.  If you do not have either of the 2 medicines listed above call your primary care on Monday to have a prescription for medicine she can tolerate.  I also recommend following up with the neurosurgeon listed on the paperwork.

## 2023-04-16 NOTE — ED Provider Notes (Signed)
[facility] Provider Note  Patient Contact: 11:08 PM (approximate)   History   Fall and Leg Pain   HPI  [name] is a 62 y.o. female who presents the emergency department complaining of bilateral lower leg pain.  She has a history of a fall, has had complications following surgery.  Patient had a fall roughly 2 weeks ago that she was already evaluated for but is had some increased leg pain.  She says that she is very sensitive to medications, cannot take a lot of medication and does not want a prescription at this time.  She wanted x-rays to ensure no trauma to the osseous structures.  At this time patient has no chest pain, shortness of breath, back pain, urinary symptoms, GI complaints.  This appears to be a a worsening of a chronic problem.  No neurodeficits such as bowel bladder dysfunction, saddle anesthesia or paresthesias.     Physical Exam   Triage Vital Signs: ED Triage Vitals  Enc Vitals Group     BP 04/16/23 2117 (!) 144/92     Pulse Rate 04/16/23 2117 81     Resp 04/16/23 2117 18     Temp 04/16/23 2117 98.2 F (36.8 C)     Temp Source 04/16/23 2117 Oral     SpO2 04/16/23 2117 99 %     Weight 04/16/23 2118 221 lb (100.2 kg)     Height 04/16/23 2118 5\' 6"  (1.676 m)     Head Circumference --      Peak Flow --      Pain Score 04/16/23 2118 8     Pain Loc --      Pain Edu? --      Excl. in GC? --     Most recent vital signs: Vitals:   04/16/23 2117  BP: (!) 144/92  Pulse: 81  Resp: 18  Temp: 98.2 F (36.8 C)  SpO2: 99%     General: Alert and in no acute distress.  Cardiovascular:  Good peripheral perfusion Respiratory: Normal respiratory effort without tachypnea or retractions. Lungs CTAB.  Musculoskeletal: Full range of motion to all extremities.  Visualization of the bilateral lower extremities reveals no visible signs of trauma.  Patient has sensation intact and equal bilateral lower extremities.  Pulses intact.  Patient is  ambulatory at this time.  No foot drop. Neurologic:  No gross focal neurologic deficits are appreciated.  Skin:   No rash noted Other:   ED Results / Procedures / Treatments   Labs (all labs ordered are listed, but only abnormal results are displayed) Labs Reviewed - No data to display   EKG     RADIOLOGY  I personally viewed, evaluated, and interpreted these images as part of my medical decision making, as well as reviewing the written report by the radiologist.  ED Provider Interpretation: No acute findings on x-ray.  DG Ankle Complete Right  Result Date: 04/16/2023 CLINICAL DATA:  Right lower leg pain after a fall 2 weeks ago. EXAM: RIGHT ANKLE - COMPLETE 3+ VIEW COMPARISON:  None Available. FINDINGS: There is no evidence of fracture, dislocation, or joint effusion. There is no evidence of arthropathy or other focal bone abnormality. Soft tissues are unremarkable. IMPRESSION: Negative. Electronically Signed   By: Burman Nieves M.D.   On: 04/16/2023 22:03   DG Knee Complete 4 Views Right  Result Date: 04/16/2023 CLINICAL DATA:  Right lower leg pain for 2 weeks after a fall. EXAM: RIGHT  KNEE - COMPLETE 4+ VIEW COMPARISON:  03/08/2020 FINDINGS: No evidence of fracture, dislocation, or joint effusion. No evidence of arthropathy or other focal bone abnormality. Soft tissues are unremarkable. IMPRESSION: Negative. Electronically Signed   By: Burman Nieves M.D.   On: 04/16/2023 22:03    PROCEDURES:  Critical Care performed: No  Procedures   MEDICATIONS ORDERED IN ED: Medications - No data to display   IMPRESSION / MDM / ASSESSMENT AND PLAN / ED COURSE  I reviewed the triage vital signs and the nursing notes.                                 Differential diagnosis includes, but is not limited to, sciatica, lumbar radiculopathy, cauda equina  Patient's presentation is most consistent with acute presentation with potential threat to life or bodily function.    Patient's diagnosis is consistent with sciatica.  Patient presents with worsening of her neck chronic problem.  She has sciatica, worse after a fall 2 weeks ago.  There is no concerning neurodeficits and patient already has a routine MRI scheduled in 2 weeks.  Patient is very sensitive to medications and did not want medications prescribed.  She states that she will call her primary care to find out which medications she can safely take to help with symptoms.  X-rays were negative.  Patient is safe for discharge at this time.   Patient is given ED precautions to return to the ED for any worsening or new symptoms.     FINAL CLINICAL IMPRESSION(S) / ED DIAGNOSES   Final diagnoses:  Bilateral sciatica     Rx / DC Orders   ED Discharge Orders     None        Note:  This document was prepared using Dragon voice recognition software and may include unintentional dictation errors.   Lanette Hampshire 04/16/23 2331    Pilar Jarvis, MD 04/17/23 [phone]

## 2023-04-16 NOTE — ED Triage Notes (Addendum)
  Patient comes in with R lower leg pain that has been going on for the past two weeks.  Patient states she had a fall 2 weeks ago and has had spasms/shooting pain in R lower leg from knee down to foot.  Patient endorses difficulty ambulating.  States she is unable to sleep in her bed due to pain and has to sleep in recliner.  Pain 8/10, cramping/spasms.  No OTC meds taken at home.  Patient requesting x-ray to make sure nothing wrong with leg.

## 2023-07-06 ENCOUNTER — Ambulatory Visit
Admission: EM | Admit: 2023-07-06 | Discharge: 2023-07-06 | Disposition: A | Discharge status: Home / Self Care | Payer: BC Managed Care – PPO | Attending: Emergency Medicine | Admitting: Emergency Medicine

## 2023-07-06 ENCOUNTER — Ambulatory Visit (INDEPENDENT_AMBULATORY_CARE_PROVIDER_SITE_OTHER): Payer: BC Managed Care – PPO

## 2023-07-06 DIAGNOSIS — R051 Acute cough: Secondary | ICD-10-CM

## 2023-07-06 DIAGNOSIS — J45901 Unspecified asthma with (acute) exacerbation: Secondary | ICD-10-CM

## 2023-07-06 DIAGNOSIS — U071 COVID-19: Secondary | ICD-10-CM

## 2023-07-06 MED ORDER — ALBUTEROL SULFATE (2.5 MG/3ML) 0.083% IN NEBU
2.5000 mg | INHALATION_SOLUTION | Freq: Once | RESPIRATORY_TRACT | Status: AC
  Administered 2023-07-06: 2.5 mg via RESPIRATORY_TRACT

## 2023-07-06 MED ORDER — ALBUTEROL SULFATE HFA 108 (90 BASE) MCG/ACT IN AERS: 1.0000 | INHALATION_SPRAY | Freq: Four times a day (QID) | RESPIRATORY_TRACT | 0 refills | Status: AC | PRN

## 2023-07-06 NOTE — Discharge Instructions (Addendum)
Go to the emergency department if you have shortness of breath or other concerning symptoms.   Use the albuterol inhaler as directed.  Follow up with your primary care provider.

## 2023-07-06 NOTE — ED Provider Notes (Signed)
[name]    CSN: [id number] Arrival date & time: 07/06/23  1714      History   Chief Complaint Chief Complaint  Patient presents with   Shortness of Breath    HPI [name] is a 62 y.o. female.  Patient presents with day 5 of cough, shortness of breath, chest tightness, fatigue, body aches, headache.  She had a positive COVID test on 07/03/2023.  She is out of her albuterol.  No OTC medications taken today.  She denies fever, chest pain, vomiting, diarrhea, or other symptoms.  Her medical history includes asthma, hypertension, heart failure, TIA/stroke, lupus, IBS, chronic pain.   The history is provided by the patient and medical records.    Past Medical History:  Diagnosis Date   Anxiety    Asthma    Atypical chest pain    in the setting of anxiety   Back pain    Chronic pain    Diastolic heart failure (HCC)    Edema    Fibromyalgia    Hypertension    IBS (irritable bowel syndrome)    Lupus (HCC)    MVP (mitral valve prolapse) 12/22/2011   not confirmed on Echo 08/2015   Pain in limb    Positive ANA (antinuclear antibody) 01/29/2015   S/P dilatation of esophageal stricture 12/22/2011   Stroke (HCC)    Syncopal episodes    TIA (transient ischemic attack)     Patient Active Problem List   Diagnosis Date Noted   Lumbar degenerative disc disease 02/01/2017   Diastolic heart failure (HCC) 11/11/2016   Cervical myofascial pain syndrome 01/08/2016   CTS (carpal tunnel syndrome) 12/05/2015   Radiculopathy of lumbosacral region 12/05/2015   Morning headache 08/29/2015   Daytime somnolence 08/29/2015   Obesity 08/29/2015   Snoring 08/29/2015   Paresthesias 08/29/2015   Convulsions/seizures (HCC) 08/29/2015   Altered awareness, transient    Cervical paraspinal muscle spasm 08/03/2015   Other specified transient cerebral ischemias    Essential hypertension    Noncompliance with medication regimen    TIA (transient ischemic attack) 08/02/2015   Leg  swelling 01/29/2015   Positive ANA (antinuclear antibody) 01/29/2015   Dizzy spells 01/01/2015   Shortness of breath 11/20/2014   DDD (degenerative disc disease), cervical 11/09/2013   Generalized anxiety disorder 12/27/2012   Menopause syndrome 11/27/2012   Allergic rhinitis 11/27/2012   Hyperlipidemia 12/22/2011   MVP (mitral valve prolapse) 12/22/2011   S/P dilatation of esophageal stricture 12/22/2011   Atypical chest pain 08/19/2011    Past Surgical History:  Procedure Laterality Date   ABDOMINAL HYSTERECTOMY     CARDIAC CATHETERIZATION     CARDIAC CATHETERIZATION  07/2014   normal coronary arteries [facility])   SPINE SURGERY     TONSILLECTOMY      OB History   No obstetric history on file.      Home Medications    Prior to Admission medications   Medication Sig Start Date End Date Taking? Authorizing Provider  albuterol (VENTOLIN HFA) 108 (90 Base) MCG/ACT inhaler Inhale 1-2 puffs into the lungs every 6 (six) hours as needed. 07/06/23  Yes Mickie Bail, NP  aspirin 81 MG chewable tablet Chew 81 mg by mouth daily.    [provider]  chlorthalidone (HYGROTON) 25 MG tablet Take 25 mg by mouth daily.    [provider]  clonazePAM (KLONOPIN) 0.5 MG tablet Take 0.5 mg by mouth 2 (two) times daily as needed for anxiety.  [provider]  desvenlafaxine (PRISTIQ) 100 MG 24 hr tablet Take 100 mg by mouth daily.    [provider]  doxycycline (VIBRAMYCIN) 100 MG capsule Take 100 mg by mouth 2 (two) times daily.    [provider]  enalapril (VASOTEC) 20 MG tablet Take 2 tablets (40 mg total) by mouth daily. Patient taking differently: Take 20 mg daily by mouth.  01/08/17   Lizbeth Bark, FNP  Enalapril-hydroCHLOROthiazide 5-12.5 MG tablet enalapril 5 mg-hydrochlorothiazide 12.5 mg tablet    [provider]  hydrochlorothiazide (HYDRODIURIL) 25 MG tablet Take 25 mg by mouth daily.    [provider]   metFORMIN (GLUCOPHAGE) 500 MG tablet Take by mouth 2 (two) times daily with a meal.    [provider]  ondansetron (ZOFRAN ODT) 4 MG disintegrating tablet Take 1 tablet (4 mg total) by mouth every 8 (eight) hours as needed for nausea or vomiting. 06/28/21   Chesley Noon, MD  promethazine-dextromethorphan (PROMETHAZINE-DM) 6.25-15 MG/5ML syrup Take 5 mLs by mouth every 6 (six) hours as needed.    [provider]    Family History Family History  Problem Relation Age of Onset   Diabetes Mother    Hypertension Mother    Heart disease Mother    Arthritis/Rheumatoid Mother    Diabetes Father    Hypertension Father    COPD Father    Heart attack Father 70   Emphysema Father    Stroke Other    Coronary artery disease Other     Social History Social History   Tobacco Use   Smoking status: Never   Smokeless tobacco: Never  Substance Use Topics   Alcohol use: No    Alcohol/week: 0.0 standard drinks of alcohol   Drug use: No     Allergies   Hydroxychloroquine, Gabapentin, Hydrochlorothiazide, Metoprolol, Morphine and codeine, Other, Pantoprazole sodium, and Prednisone   Review of Systems Review of Systems  Constitutional:  Positive for fatigue. Negative for chills and fever.  HENT:  Negative for ear pain and sore throat.   Respiratory:  Positive for cough, chest tightness, shortness of breath and wheezing.   Cardiovascular:  Negative for chest pain and palpitations.  Gastrointestinal:  Negative for diarrhea, nausea and vomiting.     Physical Exam Triage Vital Signs ED Triage Vitals  Encounter Vitals Group     BP 07/06/23 1729 108/77     Systolic BP Percentile --      Diastolic BP Percentile --      Pulse Rate 07/06/23 1729 73     Resp 07/06/23 1727 (!) 22     Temp 07/06/23 1729 98.7 F (37.1 C)     Temp src --      SpO2 07/06/23 1727 98 %     Weight --      Height --      Head Circumference --      Peak Flow --      Pain Score 07/06/23 1727  6     Pain Loc --      Pain Education --      Exclude from Growth Chart --    No data found.  Updated Vital Signs BP 108/77   Pulse 73   Temp 98.7 F (37.1 C)   Resp (!) 22   SpO2 98%   Visual Acuity Right Eye Distance:   Left Eye Distance:   Bilateral Distance:    Right Eye Near:   Left Eye Near:  Bilateral Near:     Physical Exam Vitals and nursing note reviewed.  Constitutional:      General: She is not in acute distress.    Appearance: She is well-developed.  HENT:     Right Ear: Tympanic membrane normal.     Left Ear: Tympanic membrane normal.     Nose: Nose normal.     Mouth/Throat:     Mouth: Mucous membranes are moist.     Pharynx: Oropharynx is clear.  Cardiovascular:     Rate and Rhythm: Normal rate and regular rhythm.     Heart sounds: Normal heart sounds.  Pulmonary:     Effort: Pulmonary effort is normal. No respiratory distress.     Breath sounds: Normal breath sounds. Decreased air movement present. No wheezing.  Musculoskeletal:     Cervical back: Neck supple.  Skin:    General: Skin is warm and dry.  Neurological:     Mental Status: She is alert.  Psychiatric:        Mood and Affect: Mood normal.        Behavior: Behavior normal.      UC Treatments / Results  Labs (all labs ordered are listed, but only abnormal results are displayed) Labs Reviewed - No data to display  EKG   Radiology DG Chest 2 View  Result Date: 07/06/2023 CLINICAL DATA:  Cough and shortness of breath. COVID positive. Chest tightness when breathing. Body aches, headache, cough, fatigue. EXAM: CHEST - 2 VIEW COMPARISON:  03/09/2023 FINDINGS: The heart size and mediastinal contours are within normal limits. Both lungs are clear. The visualized skeletal structures are unremarkable. IMPRESSION: No active cardiopulmonary disease. Electronically Signed   By: Burman Nieves M.D.   On: 07/06/2023 19:26    Procedures Procedures (including critical care  time)  Medications Ordered in UC Medications  albuterol (PROVENTIL) (2.5 MG/3ML) 0.083% nebulizer solution 2.5 mg (2.5 mg Nebulization Given 07/06/23 1821)    Initial Impression / Assessment and Plan / UC Course  I have reviewed the triage vital signs and the nursing notes.  Pertinent labs & imaging results that were available during my care of the patient were reviewed by me and considered in my medical decision making (see chart for details).    Cough, COVID-19, asthma exacerbation.  No respiratory distress, O2 sat 98% on room air.  Patient is on day 5 of her symptoms.  She declines antiviral treatment.  CXR negative.  Albuterol nebulizer treatment done here and patient has decreased chest tightness and improved air movement.  Refill of albuterol inhaler provided today.  Instructed her to follow-up with her PCP.  ED precautions given.  Education provided on COVID and asthma.  Patient agrees to plan of care.    Final Clinical Impressions(s) / UC Diagnoses   Final diagnoses:  Acute cough  COVID-19  Asthma with acute exacerbation, unspecified asthma severity, unspecified whether persistent     Discharge Instructions      Go to the emergency department if you have shortness of breath or other concerning symptoms.    Use the albuterol inhaler as directed.    Follow-up with your primary care provider.     ED Prescriptions     Medication Sig Dispense Auth. Provider   albuterol (VENTOLIN HFA) 108 (90 Base) MCG/ACT inhaler Inhale 1-2 puffs into the lungs every 6 (six) hours as needed. 18 g Mickie Bail, NP      PDMP not reviewed this encounter.   Mickie Bail,  NP 07/06/23 1940

## 2023-07-06 NOTE — ED Triage Notes (Signed)
Patient to Urgent Care with complaints of  shortness of breath/ chest tightness when breathing.   Tested positive for Covid on Sunday. Also reports body aches/ headaches/ cough/ fatigue. Coughing spells at night.   Reports hx of asthma- reports her albuterol inhaler is out (requests refill).

## 2023-08-23 LAB — EXTERNAL GENERIC LAB PROCEDURE: COLOGUARD: NEGATIVE

## 2023-08-23 LAB — COLOGUARD: COLOGUARD: NEGATIVE

## 2024-02-23 ENCOUNTER — Encounter: Payer: Self-pay | Admitting: Emergency Medicine

## 2024-02-23 ENCOUNTER — Ambulatory Visit
Admission: EM | Admit: 2024-02-23 | Discharge: 2024-02-23 | Disposition: A | Discharge status: Home / Self Care | Attending: Emergency Medicine | Admitting: Emergency Medicine

## 2024-02-23 DIAGNOSIS — L0291 Cutaneous abscess, unspecified: Secondary | ICD-10-CM

## 2024-02-23 MED ORDER — DOXYCYCLINE HYCLATE 100 MG PO CAPS: 100.0000 mg | ORAL_CAPSULE | Freq: Two times a day (BID) | ORAL | 0 refills | Status: AC

## 2024-02-23 MED ORDER — MUPIROCIN 2 % EX OINT: 1.0000 | TOPICAL_OINTMENT | Freq: Two times a day (BID) | CUTANEOUS | 0 refills | Status: AC

## 2024-02-23 NOTE — ED Triage Notes (Signed)
 Pt presents with a bug bite to the back of her left thigh she noticed yesterday.

## 2024-02-23 NOTE — ED Provider Notes (Signed)
 Arlander Bellman    CSN: [id number] Arrival date & time: 02/23/24  1900      History   Chief Complaint Chief Complaint  Patient presents with   Insect Bite    HPI [name] is a 63 y.o. female.  Patient presents with a tender mildly pruritic insect bite on the back of her left thigh which she noticed yesterday.  The area has become red and draining pus today.  No treatments at home.  No fever.  The history is provided by the patient and medical records.    Past Medical History:  Diagnosis Date   Anxiety    Asthma    Atypical chest pain    in the setting of anxiety   Back pain    Chronic pain    Diastolic heart failure (HCC)    Edema    Fibromyalgia    Hypertension    IBS (irritable bowel syndrome)    Lupus    MVP (mitral valve prolapse) 12/22/2011   not confirmed on Echo 08/2015   Pain in limb    Positive ANA (antinuclear antibody) 01/29/2015   S/P dilatation of esophageal stricture 12/22/2011   Stroke (HCC)    Syncopal episodes    TIA (transient ischemic attack)     Patient Active Problem List   Diagnosis Date Noted   Lumbar degenerative disc disease 02/01/2017   Diastolic heart failure (HCC) 11/11/2016   Cervical myofascial pain syndrome 01/08/2016   CTS (carpal tunnel syndrome) 12/05/2015   Radiculopathy of lumbosacral region 12/05/2015   Morning headache 08/29/2015   Daytime somnolence 08/29/2015   Obesity 08/29/2015   Snoring 08/29/2015   Paresthesias 08/29/2015   Convulsions/seizures (HCC) 08/29/2015   Altered awareness, transient    Cervical paraspinal muscle spasm 08/03/2015   Other specified transient cerebral ischemias    Essential hypertension    Noncompliance with medication regimen    TIA (transient ischemic attack) 08/02/2015   Leg swelling 01/29/2015   Positive ANA (antinuclear antibody) 01/29/2015   Dizzy spells 01/01/2015   Shortness of breath 11/20/2014   DDD (degenerative disc disease), cervical 11/09/2013   Generalized  anxiety disorder 12/27/2012   Menopause syndrome 11/27/2012   Allergic rhinitis 11/27/2012   Hyperlipidemia 12/22/2011   MVP (mitral valve prolapse) 12/22/2011   S/P dilatation of esophageal stricture 12/22/2011   Atypical chest pain 08/19/2011    Past Surgical History:  Procedure Laterality Date   ABDOMINAL HYSTERECTOMY     CARDIAC CATHETERIZATION     CARDIAC CATHETERIZATION  07/2014   normal coronary arteries [facility])   SPINE SURGERY     TONSILLECTOMY      OB History   No obstetric history on file.      Home Medications    Prior to Admission medications   Medication Sig Start Date End Date Taking? Authorizing Provider  doxycycline  (VIBRAMYCIN ) 100 MG capsule Take 1 capsule (100 mg total) by mouth 2 (two) times daily for 7 days. 02/23/24 03/01/24 Yes Wellington Half, NP  mupirocin  ointment (BACTROBAN ) 2 % Apply 1 Application topically 2 (two) times daily. 02/23/24  Yes Wellington Half, NP  albuterol  (VENTOLIN  HFA) 108 (90 Base) MCG/ACT inhaler Inhale 1-2 puffs into the lungs every 6 (six) hours as needed. 07/06/23   Wellington Half, NP  aspirin  81 MG chewable tablet Chew 81 mg by mouth daily.    [provider]  chlorthalidone (HYGROTON) 25 MG tablet Take 25 mg by mouth daily.    [provider]  clonazePAM (KLONOPIN) 0.5 MG tablet Take 0.5 mg by mouth 2 (two) times daily as needed for anxiety.    [provider]  desvenlafaxine (PRISTIQ) 100 MG 24 hr tablet Take 100 mg by mouth daily.    [provider]  enalapril  (VASOTEC ) 20 MG tablet Take 2 tablets (40 mg total) by mouth daily. Patient taking differently: Take 20 mg daily by mouth.  01/08/17   Carin Charleston, FNP  Enalapril -hydroCHLOROthiazide  5-12.5 MG tablet enalapril  5 mg-hydrochlorothiazide  12.5 mg tablet    [provider]  hydrochlorothiazide  (HYDRODIURIL ) 25 MG tablet Take 25 mg by mouth daily.    [provider]  metFORMIN (GLUCOPHAGE) 500 MG tablet Take by mouth 2  (two) times daily with a meal.    [provider]  ondansetron  (ZOFRAN  ODT) 4 MG disintegrating tablet Take 1 tablet (4 mg total) by mouth every 8 (eight) hours as needed for nausea or vomiting. 06/28/21   Twilla Galea, MD  promethazine-dextromethorphan (PROMETHAZINE-DM) 6.25-15 MG/5ML syrup Take 5 mLs by mouth every 6 (six) hours as needed.    [provider]    Family History Family History  Problem Relation Age of Onset   Diabetes Mother    Hypertension Mother    Heart disease Mother    Arthritis/Rheumatoid Mother    Diabetes Father    Hypertension Father    COPD Father    Heart attack Father 66   Emphysema Father    Stroke Other    Coronary artery disease Other     Social History Social History   Tobacco Use   Smoking status: Never   Smokeless tobacco: Never  Vaping Use   Vaping status: Never Used  Substance Use Topics   Alcohol use: No    Alcohol/week: 0.0 standard drinks of alcohol   Drug use: No     Allergies   Hydroxychloroquine , Gabapentin , Hydrochlorothiazide , Metoprolol , Morphine  and codeine, Other, Pantoprazole, Pantoprazole sodium, and Prednisone    Review of Systems Review of Systems  Constitutional:  Negative for chills and fever.  Musculoskeletal:  Negative for arthralgias and joint swelling.  Skin:  Positive for color change and wound.     Physical Exam Triage Vital Signs ED Triage Vitals  Encounter Vitals Group     BP 02/23/24 1917 130/81     Systolic BP Percentile --      Diastolic BP Percentile --      Pulse Rate 02/23/24 1917 85     Resp 02/23/24 1917 18     Temp 02/23/24 1917 97.7 F (36.5 C)     Temp src --      SpO2 02/23/24 1917 96 %     Weight --      Height --      Head Circumference --      Peak Flow --      Pain Score 02/23/24 1924 4     Pain Loc --      Pain Education --      Exclude from Growth Chart --    No data found.  Updated Vital Signs BP 130/81   Pulse 85   Temp 97.7 F (36.5 C)    Resp 18   SpO2 96%   Visual Acuity Right Eye Distance:   Left Eye Distance:   Bilateral Distance:    Right Eye Near:   Left Eye Near:    Bilateral Near:     Physical Exam Constitutional:      General: She is  not in acute distress. HENT:     Mouth/Throat:     Mouth: Mucous membranes are moist.  Cardiovascular:     Rate and Rhythm: Normal rate and regular rhythm.  Pulmonary:     Effort: Pulmonary effort is normal. No respiratory distress.  Skin:    General: Skin is warm and dry.     Findings: Erythema and lesion present.     Comments: Left posterior thigh: 1 cm tender open lesion with purulent drainage and localized erythema.    Neurological:     Mental Status: She is alert.      UC Treatments / Results  Labs (all labs ordered are listed, but only abnormal results are displayed) Labs Reviewed - No data to display  EKG   Radiology No results found.  Procedures Procedures (including critical care time)  Medications Ordered in UC Medications - No data to display  Initial Impression / Assessment and Plan / UC Course  I have reviewed the triage vital signs and the nursing notes.  Pertinent labs & imaging results that were available during my care of the patient were reviewed by me and considered in my medical decision making (see chart for details).    Abscess on left posterior thigh.  Afebrile and vital signs are stable.  Treating with doxycycline  and mupirocin  ointment.  Wound care instructions discussed.  Instructed patient to follow-up with her PCP tomorrow.  Education provided on skin abscess.  She agrees to plan of care.  Final Clinical Impressions(s) / UC Diagnoses   Final diagnoses:  Abscess     Discharge Instructions      Take the doxycycline  as directed.    Keep your wound clean and dry.  Wash it gently twice a day with soap and water.  Then apply the antibiotic cream and bandage.    Follow up with your primary care provider tomorrow.           ED Prescriptions     Medication Sig Dispense Auth. Provider   doxycycline  (VIBRAMYCIN ) 100 MG capsule Take 1 capsule (100 mg total) by mouth 2 (two) times daily for 7 days. 14 capsule Leanor Proper H, NP   mupirocin  ointment (BACTROBAN ) 2 % Apply 1 Application topically 2 (two) times daily. 22 g Wellington Half, NP      PDMP not reviewed this encounter.   Wellington Half, NP 02/23/24 [phone]

## 2024-02-23 NOTE — Discharge Instructions (Addendum)
 Take the doxycycline  as directed.    Keep your wound clean and dry.  Wash it gently twice a day with soap and water.  Then apply the antibiotic cream and bandage.    Follow up with your primary care provider tomorrow.

## 2024-08-13 ENCOUNTER — Encounter: Payer: Self-pay | Admitting: Emergency Medicine

## 2024-08-13 ENCOUNTER — Emergency Department
Admission: EM | Admit: 2024-08-13 | Discharge: 2024-08-13 | Disposition: A | Discharge status: Home / Self Care | Attending: Emergency Medicine | Admitting: Emergency Medicine

## 2024-08-13 ENCOUNTER — Other Ambulatory Visit: Payer: Self-pay

## 2024-08-13 ENCOUNTER — Emergency Department

## 2024-08-13 DIAGNOSIS — R059 Cough, unspecified: Secondary | ICD-10-CM | POA: Diagnosis not present

## 2024-08-13 DIAGNOSIS — J45909 Unspecified asthma, uncomplicated: Secondary | ICD-10-CM | POA: Diagnosis not present

## 2024-08-13 DIAGNOSIS — R0602 Shortness of breath: Secondary | ICD-10-CM | POA: Diagnosis present

## 2024-08-13 DIAGNOSIS — R0789 Other chest pain: Secondary | ICD-10-CM | POA: Diagnosis not present

## 2024-08-13 LAB — BASIC METABOLIC PANEL WITH GFR
Anion gap: 17 — ABNORMAL HIGH (ref 5–15)
BUN: 13 mg/dL (ref 8–23)
CO2: 23 mmol/L (ref 22–32)
Calcium: 9.1 mg/dL (ref 8.9–10.3)
Chloride: 102 mmol/L (ref 98–111)
Creatinine, Ser: 0.96 mg/dL (ref 0.44–1.00)
GFR, Estimated: >60 mL/min (ref >60)
Glucose, Bld: 117 mg/dL — ABNORMAL HIGH (ref 70–99)
Potassium: 3.8 mmol/L (ref 3.5–5.1)
Sodium: 142 mmol/L (ref 135–145)

## 2024-08-13 LAB — CBC
HCT: 35.7 % — ABNORMAL LOW (ref 36.0–46.0)
Hemoglobin: 11.5 g/dL — ABNORMAL LOW (ref 12.0–15.0)
MCH: 30.5 pg (ref 26.0–34.0)
MCHC: 32.2 g/dL (ref 30.0–36.0)
MCV: 94.7 fL (ref 80.0–100.0)
Platelets: 161 K/uL (ref 150–400)
RBC: 3.77 MIL/uL — ABNORMAL LOW (ref 3.87–5.11)
RDW: 12.4 % (ref 11.5–15.5)
WBC: 5.8 K/uL (ref 4.0–10.5)
nRBC: 0 % (ref 0.0–0.2)

## 2024-08-13 LAB — LIPASE, BLOOD: Lipase: 31 U/L (ref 11–51)

## 2024-08-13 LAB — HEPATIC FUNCTION PANEL
ALT: 13 U/L (ref 0–44)
AST: 20 U/L (ref 15–41)
Albumin: 3.6 g/dL (ref 3.5–5.0)
Alkaline Phosphatase: 67 U/L (ref 38–126)
Bilirubin, Direct: <0.1 mg/dL (ref 0.0–0.2)
Total Bilirubin: 0.5 mg/dL (ref 0.0–1.2)
Total Protein: 6.4 g/dL — ABNORMAL LOW (ref 6.5–8.1)

## 2024-08-13 LAB — RESP PANEL BY RT-PCR (RSV, FLU A&B, COVID)  RVPGX2
Influenza A by PCR: NEGATIVE
Influenza B by PCR: NEGATIVE
Resp Syncytial Virus by PCR: NEGATIVE
SARS Coronavirus 2 by RT PCR: NEGATIVE

## 2024-08-13 LAB — D-DIMER, QUANTITATIVE: D-Dimer, Quant: 0.63 ug{FEU}/mL — ABNORMAL HIGH (ref 0.00–0.50)

## 2024-08-13 LAB — TROPONIN I (HIGH SENSITIVITY): Troponin I (High Sensitivity): <2 ng/L (ref <18)

## 2024-08-13 MED ORDER — ALBUTEROL SULFATE HFA 108 (90 BASE) MCG/ACT IN AERS: 2.0000 | INHALATION_SPRAY | Freq: Four times a day (QID) | RESPIRATORY_TRACT | 0 refills | Status: AC | PRN

## 2024-08-13 MED ORDER — KETOROLAC TROMETHAMINE 15 MG/ML IJ SOLN
15.0000 mg | Freq: Once | INTRAMUSCULAR | Status: AC
Start: 2024-08-13 — End: 2024-08-13
  Administered 2024-08-13: 15 mg via INTRAVENOUS
  Filled 2024-08-13: qty 1

## 2024-08-13 MED ORDER — IPRATROPIUM-ALBUTEROL 0.5-2.5 (3) MG/3ML IN SOLN
3.0000 mL | Freq: Once | RESPIRATORY_TRACT | Status: AC
  Administered 2024-08-13: 3 mL via RESPIRATORY_TRACT
  Filled 2024-08-13: qty 3

## 2024-08-13 NOTE — Discharge Instructions (Signed)
 You are seen in the ER today for evaluation of your shortness of breath.  Your testing was fortunately overall reassuring.  I sent a refill of your albuterol  inhaler to your pharmacy.  Follow-up with your primary care doctor for further evaluation.  You can take Tylenol  and ibuprofen  to help with your chest wall pain.  Return to the ER for new or worsening symptoms.

## 2024-08-13 NOTE — ED Notes (Signed)
 Extra blue top and extra green top lab tube sent to lab

## 2024-08-13 NOTE — ED Provider Notes (Signed)
 [facility] Provider Note    Event Date/Time   First MD Initiated Contact with Patient 08/13/24 [phone]     (approximate)   History   Shortness of Breath   HPI  [name] is a 63 year old female presenting to the ER for evaluation of cough and shortness of breath.  For the past few days patient reports she has had a cough with associated chest tightness.  Reports pain is primarily along her right lower chest.  No fevers.  Does report a history of asthma but says that she does not always wheeze with this.  Has not tried using her home inhalers.    Physical Exam   Triage Vital Signs: ED Triage Vitals  Encounter Vitals Group     BP 08/13/24 0639 (!) 138/90     Girls Systolic BP Percentile --      Girls Diastolic BP Percentile --      Boys Systolic BP Percentile --      Boys Diastolic BP Percentile --      Pulse Rate 08/13/24 0639 61     Resp 08/13/24 0639 18     Temp 08/13/24 0639 98.1 F (36.7 C)     Temp Source 08/13/24 0639 Oral     SpO2 08/13/24 0639 97 %     Weight 08/13/24 0640 216 lb (98 kg)     Height 08/13/24 0640 5' 6 (1.676 m)     Head Circumference --      Peak Flow --      Pain Score 08/13/24 0639 7     Pain Loc --      Pain Education --      Exclude from Growth Chart --     Most recent vital signs: Vitals:   08/13/24 0639  BP: (!) 138/90  Pulse: 61  Resp: 18  Temp: 98.1 F (36.7 C)  SpO2: 97%     General: Awake, interactive  CV:  Good peripheral perfusion Resp:  Unlabored respirations, lung sounds mildly diminished but no significant wheezing noted Abd:  Nondistended, soft, nontender Neuro:  Symmetric facial movement, fluid speech   ED Results / Procedures / Treatments   Labs (all labs ordered are listed, but only abnormal results are displayed) Labs Reviewed  BASIC METABOLIC PANEL WITH GFR - Abnormal; Notable for the following components:      Result Value   Glucose, Bld 117 (*)    Anion gap 17 (*)    All  other components within normal limits  CBC - Abnormal; Notable for the following components:   RBC 3.77 (*)    Hemoglobin 11.5 (*)    HCT 35.7 (*)    All other components within normal limits  D-DIMER, QUANTITATIVE - Abnormal; Notable for the following components:   D-Dimer, Quant 0.63 (*)    All other components within normal limits  HEPATIC FUNCTION PANEL - Abnormal; Notable for the following components:   Total Protein 6.4 (*)    All other components within normal limits  RESP PANEL BY RT-PCR (RSV, FLU A&B, COVID)  [id number]  LIPASE, BLOOD  TROPONIN I (HIGH SENSITIVITY)     EKG EKG independently reviewed and interpreted by myself demonstrates:  EKG demonstrates normal sinus rhythm at a rate of 62, PR 166, QRS 98, QTc 428, no acute ST changes  RADIOLOGY Imaging independently reviewed and interpreted by myself demonstrates:  CXR without focal consolidation  Formal Radiology Read:  DG Chest 2 View Result Date: 08/13/2024  EXAM: 2 VIEW(S) XRAY OF THE CHEST 08/13/2024 07:17:53 AM COMPARISON: Chest radiograph 09/03/last year and earlier. CLINICAL HISTORY: 63 year old female with shortness of breath for a couple of days, nonproductive cough, and chest burning. FINDINGS: LUNGS AND PLEURA: Mildly lower lung volumes which mildly exaggerates cardiac size. Lung markings remain within normal limits. No focal pulmonary opacity. No pulmonary edema. No pleural effusion. No pneumothorax. HEART AND MEDIASTINUM: The cardiac silhouette is mildly exaggerated due to lower lung volumes. Mediastinal contours remain within normal limits. No acute abnormality of the mediastinal silhouette. BONES AND SOFT TISSUES: No acute osseous abnormality. IMPRESSION: 1. No acute cardiopulmonary abnormality. Electronically signed by: Helayne Hurst MD 08/13/2024 07:24 AM EDT RP Workstation: [id number]    PROCEDURES:  Critical Care performed: No  Procedures   MEDICATIONS ORDERED IN ED: Medications  ketorolac  (TORADOL )  15 MG/ML injection 15 mg (has no administration in time range)  ipratropium-albuterol  (DUONEB) 0.5-2.5 (3) MG/3ML nebulizer solution 3 mL (3 mLs Nebulization Given 08/13/24 9277)     IMPRESSION / MDM / ASSESSMENT AND PLAN / ED COURSE  I reviewed the triage vital signs and the nursing notes.  Differential diagnosis includes, but is not limited to, Pneumonia, viral illness, pneumothorax, PE, lower suspicion ACS, asthma exacerbation  Patient's presentation is most consistent with acute presentation with potential threat to life or bodily function.  63 year old female presenting with shortness of breath.  Will obtain labs, x-Zanaya Baize to further evaluate.  EKG nonischemic.  Will trial DuoNeb.  Reports intolerance to steroids and has documented allergy to prednisone , will defer on this for now.  Clinical Course as of 08/13/24 9171  [place] Aug 13, 2024  0812 D-dimer, quantitative(!) D dimer at upper limit of normal using age-adjusted cutoff. Low suspicion PE, CTA not indicated at this time.  [NR]    Clinical Course User Index [NR] Levander Slate, MD   CBC with mild anemia not sniffily changed from prior, normal white blood cell count, reassuring BMP, LFTs, lipase.  D-dimer within age-adjusted cutoff as below.  Negative troponin and well over 3 hours of symptoms.  Low suspicion ACS.  Negative viral swab.  Reassessed after breathing treatment and does feel improved.  Does have some ongoing chest wall discomfort from her coughing.  Ordered for Toradol .  She does comfortable with discharge home.  Will send refill for her albuterol  inhaler.  Continues to decline steroids.  Suspect possible viral illness with possible associated pleurisy and chest wall pain from coughing.  Discussed supportive care.  Strict return precautions provided.  Patient discharged in stable condition  FINAL CLINICAL IMPRESSION(S) / ED DIAGNOSES   Final diagnoses:  Shortness of breath     Rx / DC Orders   ED Discharge Orders           Ordered    albuterol  (VENTOLIN  HFA) 108 (90 Base) MCG/ACT inhaler  Every 6 hours PRN        08/13/24 0828             Note:  This document was prepared using Dragon voice recognition software and may include unintentional dictation errors.   Levander Slate, MD 08/13/24 [phone]

## 2024-08-13 NOTE — ED Triage Notes (Signed)
 Patient coming to ED for evaluation of shortness of breath x a couple of days.  States symptoms started with a nonproductive cough.  Feels a burning in my chest. No reports of fevers.  Has had generalized body aches and burning in my bones.  Feels like she is unable to get a good breath and my lungs are tired.

## 2024-10-21 ENCOUNTER — Emergency Department: Admission: EM | Admit: 2024-10-21 | Discharge: 2024-10-21 | Discharge status: Against Medical Advice

## 2024-10-21 ENCOUNTER — Other Ambulatory Visit: Payer: Self-pay

## 2024-10-21 ENCOUNTER — Emergency Department

## 2024-10-21 DIAGNOSIS — R0789 Other chest pain: Secondary | ICD-10-CM | POA: Insufficient documentation

## 2024-10-21 DIAGNOSIS — M25512 Pain in left shoulder: Secondary | ICD-10-CM | POA: Insufficient documentation

## 2024-10-21 DIAGNOSIS — R202 Paresthesia of skin: Secondary | ICD-10-CM | POA: Insufficient documentation

## 2024-10-21 DIAGNOSIS — Z5321 Procedure and treatment not carried out due to patient leaving prior to being seen by health care provider: Secondary | ICD-10-CM | POA: Insufficient documentation

## 2024-10-21 LAB — CBC
HCT: 37.4 % (ref 36.0–46.0)
Hemoglobin: 12.2 g/dL (ref 12.0–15.0)
MCH: 30.3 pg (ref 26.0–34.0)
MCHC: 32.6 g/dL (ref 30.0–36.0)
MCV: 92.8 fL (ref 80.0–100.0)
Platelets: 193 K/uL (ref 150–400)
RBC: 4.03 MIL/uL (ref 3.87–5.11)
RDW: 12.6 % (ref 11.5–15.5)
WBC: 6.4 K/uL (ref 4.0–10.5)
nRBC: 0 % (ref 0.0–0.2)

## 2024-10-21 LAB — BASIC METABOLIC PANEL WITH GFR
Anion gap: 14 (ref 5–15)
BUN: 14 mg/dL (ref 8–23)
CO2: 27 mmol/L (ref 22–32)
Calcium: 9 mg/dL (ref 8.9–10.3)
Chloride: 102 mmol/L (ref 98–111)
Creatinine, Ser: 0.92 mg/dL (ref 0.44–1.00)
GFR, Estimated: >60 mL/min
Glucose, Bld: 131 mg/dL — ABNORMAL HIGH (ref 70–99)
Potassium: 3.6 mmol/L (ref 3.5–5.1)
Sodium: 143 mmol/L (ref 135–145)

## 2024-10-21 LAB — TROPONIN T, HIGH SENSITIVITY: Troponin T High Sensitivity: <15 ng/L (ref 0–19)

## 2024-10-21 NOTE — ED Triage Notes (Signed)
 Pt to ED for pain to L chest, under axilla, and L shoulder and arm that radiates up and down with tingling, worse at night. Intermittent since about 1 week. Hx mitral valve prolapse. Respirations unlabored, skin dry.

## 2024-10-21 NOTE — ED Provider Triage Note (Signed)
 Emergency Medicine Provider Triage Evaluation Note  [name] , a 63 y.o. female  was evaluated in triage.  Pt complains of left side chest pressure and numbness/tingling sensation to the left arm. History of MVP. Symptoms started over a week ago. Intermittent at first, but becoming more frequent and lasting longer.   Physical Exam  BP 125/85 (BP Location: Left Arm)   Pulse 70   Temp 97.8 F (36.6 C) (Oral)   Resp 20   Ht 5' 5 (1.651 m)   Wt 99.8 kg   SpO2 98%   BMI 36.61 kg/m  Gen:   Awake, no distress   Resp:  Normal effort  MSK:   Moves extremities without difficulty  Other:    Medical Decision Making  Medically screening exam initiated at 5:31 PM.  Appropriate orders placed.  [name] was informed that the remainder of the evaluation will be completed by another provider, this initial triage assessment does not replace that evaluation, and the importance of remaining in the ED until their evaluation is complete.    Karen Kirk NOVAK, FNP 10/21/24 1733

## 2024-10-21 NOTE — ED Notes (Signed)
Called for labs no answer

## 2024-10-21 NOTE — ED Notes (Signed)
 Pt called third no answer
